# Patient Record
Sex: Female | Born: 1940 | Race: Black or African American | Hispanic: No | Smoking: Former smoker
Health system: Southern US, Community
[De-identification: ages and names within clinical notes are randomized; demographics above are authoritative.]

## PROBLEM LIST (undated history)

## (undated) DIAGNOSIS — C50919 Malignant neoplasm of unspecified site of unspecified female breast: Secondary | ICD-10-CM

## (undated) DIAGNOSIS — K219 Gastro-esophageal reflux disease without esophagitis: Secondary | ICD-10-CM

## (undated) DIAGNOSIS — I10 Essential (primary) hypertension: Secondary | ICD-10-CM

## (undated) DIAGNOSIS — T7840XA Allergy, unspecified, initial encounter: Secondary | ICD-10-CM

## (undated) DIAGNOSIS — R42 Dizziness and giddiness: Secondary | ICD-10-CM

## (undated) DIAGNOSIS — K635 Polyp of colon: Secondary | ICD-10-CM

## (undated) DIAGNOSIS — G8929 Other chronic pain: Secondary | ICD-10-CM

## (undated) DIAGNOSIS — M25569 Pain in unspecified knee: Secondary | ICD-10-CM

## (undated) DIAGNOSIS — E039 Hypothyroidism, unspecified: Secondary | ICD-10-CM

## (undated) DIAGNOSIS — K750 Abscess of liver: Secondary | ICD-10-CM

## (undated) HISTORY — DX: Dizziness and giddiness: R42

## (undated) HISTORY — DX: Gastro-esophageal reflux disease without esophagitis: K21.9

## (undated) HISTORY — PX: TONSILLECTOMY: SUR1361

## (undated) HISTORY — DX: Hypothyroidism, unspecified: E03.9

## (undated) HISTORY — DX: Abscess of liver: K75.0

## (undated) HISTORY — PX: HEEL SPUR SURGERY: SHX665

## (undated) HISTORY — DX: Polyp of colon: K63.5

---

## 1977-08-16 HISTORY — PX: MASTECTOMY: SHX3

## 2000-11-14 ENCOUNTER — Emergency Department (HOSPITAL_COMMUNITY): Admission: EM | Admit: 2000-11-14 | Discharge: 2000-11-14 | Payer: Self-pay

## 2000-12-22 ENCOUNTER — Ambulatory Visit (HOSPITAL_BASED_OUTPATIENT_CLINIC_OR_DEPARTMENT_OTHER): Admission: RE | Admit: 2000-12-22 | Discharge: 2000-12-22 | Payer: Self-pay | Admitting: *Deleted

## 2001-05-18 ENCOUNTER — Emergency Department (HOSPITAL_COMMUNITY): Admission: EM | Admit: 2001-05-18 | Discharge: 2001-05-18 | Payer: Self-pay | Admitting: Emergency Medicine

## 2002-02-05 ENCOUNTER — Emergency Department (HOSPITAL_COMMUNITY): Admission: EM | Admit: 2002-02-05 | Discharge: 2002-02-05 | Payer: Self-pay | Admitting: *Deleted

## 2003-07-19 ENCOUNTER — Emergency Department (HOSPITAL_COMMUNITY): Admission: EM | Admit: 2003-07-19 | Discharge: 2003-07-19 | Payer: Self-pay | Admitting: Emergency Medicine

## 2003-09-12 ENCOUNTER — Encounter: Admission: RE | Admit: 2003-09-12 | Discharge: 2003-09-12 | Payer: Self-pay | Admitting: Internal Medicine

## 2004-09-23 ENCOUNTER — Encounter: Admission: RE | Admit: 2004-09-23 | Discharge: 2004-09-23 | Payer: Self-pay | Admitting: Internal Medicine

## 2005-02-12 ENCOUNTER — Ambulatory Visit (HOSPITAL_COMMUNITY): Admission: RE | Admit: 2005-02-12 | Discharge: 2005-02-12 | Payer: Self-pay | Admitting: Obstetrics

## 2005-11-19 ENCOUNTER — Encounter: Admission: RE | Admit: 2005-11-19 | Discharge: 2005-11-19 | Payer: Self-pay | Admitting: Internal Medicine

## 2006-12-07 ENCOUNTER — Encounter: Admission: RE | Admit: 2006-12-07 | Discharge: 2006-12-07 | Payer: Self-pay | Admitting: Internal Medicine

## 2007-12-08 ENCOUNTER — Encounter: Admission: RE | Admit: 2007-12-08 | Discharge: 2007-12-08 | Payer: Self-pay | Admitting: Internal Medicine

## 2008-12-30 ENCOUNTER — Encounter: Admission: RE | Admit: 2008-12-30 | Discharge: 2008-12-30 | Payer: Self-pay | Admitting: Internal Medicine

## 2010-01-05 ENCOUNTER — Encounter: Admission: RE | Admit: 2010-01-05 | Discharge: 2010-01-05 | Payer: Self-pay | Admitting: Internal Medicine

## 2010-12-02 ENCOUNTER — Other Ambulatory Visit: Payer: Self-pay | Admitting: Internal Medicine

## 2010-12-02 DIAGNOSIS — Z9011 Acquired absence of right breast and nipple: Secondary | ICD-10-CM

## 2011-01-01 NOTE — Op Note (Signed)
[place]. [facility]  Patient:    Chelsey Savage, Chelsey Savage                     MRN: [id number] Proc. Date: 12/22/00 Adm. Date:  [id number] Attending:  Maryanna Shape                           Operative Report  PREOPERATIVE DIAGNOSIS:  Right heel Achilles tendon traction spur at os calcis.  POSTOPERATIVE DIAGNOSIS:  Right heel Achilles tendon traction spur at os calcis.  OPERATIVE PROCEDURE:  Excision of fractured traction spur.  ANESTHESIA:  General.  DESCRIPTION:  The patient was given a general anesthetic by endotracheal tube. This was accomplished with great difficulty.  The patient required several passes and use of a stent over which the endotracheal tube was passed.  All during this time she apparently was well-oxygenated and was stable.  When that was accomplished she was placed in the prone position with a pillow under her legs and longitudinal pillows to support her.  We then prepped the right lower extremity from the tips of the toes to the knee with DuraPrep then isolated this with appropriate drape.  Patient had broad chronic changes in her heel but she had very focal pain problem from what appeared to be Achilles tendon traction spur that probably had a fracture in it.  This was then approached through a vertical incision just to the lateral side of direct center.  The soft tissues were pushed aside until we saw the Achilles tendon.  The paratenon was divided and the Achilles tendon in this area was fairly degenerative over a, perhaps, 0.5 cm wide area. I could push and feel that this calcification was a bit mobile.  I then used a 15 blade knife and dissected free the degenerative tendon fibers from this loose osteophytic ridge.  Having done that it felt like there was probably a little more ridge but that was not mobile and it was not at all in the area of her tenderness.  Immediately deep to the excision of the osteophyte there were still  intact Achilles tendon fibers which appeared to be normal.  I resected some of the more degenerative tissue and then approximated the paratenon with 4-0 Vicryl, placed two subcutaneous 4-0 Vicryls, and approximated the skin edges with 4-0 nylon, then applied a very bulky dressing.  PLAN:  The plan will be for her to use crutches and just toe-touch, elevate her leg but move her ankle frequently, take Vicodin for pain, and then she would be seeing me in the office next week.  I think we will be able to ambulate her with a fracture boot. DD:  12/22/00 TD:  12/22/00 Job: [id number]

## 2011-01-08 ENCOUNTER — Ambulatory Visit
Admission: RE | Admit: 2011-01-08 | Discharge: 2011-01-08 | Disposition: A | Discharge status: Home / Self Care | Payer: Medicare Other | Source: Ambulatory Visit | Attending: Internal Medicine | Admitting: Internal Medicine

## 2011-01-08 DIAGNOSIS — Z9011 Acquired absence of right breast and nipple: Secondary | ICD-10-CM

## 2011-12-17 ENCOUNTER — Other Ambulatory Visit: Payer: Self-pay | Admitting: Internal Medicine

## 2011-12-17 DIAGNOSIS — Z1231 Encounter for screening mammogram for malignant neoplasm of breast: Secondary | ICD-10-CM

## 2011-12-17 DIAGNOSIS — Z9011 Acquired absence of right breast and nipple: Secondary | ICD-10-CM

## 2012-01-17 ENCOUNTER — Ambulatory Visit
Admission: RE | Admit: 2012-01-17 | Discharge: 2012-01-17 | Disposition: A | Discharge status: Home / Self Care | Payer: Medicare Other | Source: Ambulatory Visit | Attending: Internal Medicine | Admitting: Internal Medicine

## 2012-01-17 DIAGNOSIS — Z1231 Encounter for screening mammogram for malignant neoplasm of breast: Secondary | ICD-10-CM

## 2012-01-17 DIAGNOSIS — Z9011 Acquired absence of right breast and nipple: Secondary | ICD-10-CM

## 2012-03-26 ENCOUNTER — Encounter (HOSPITAL_COMMUNITY): Payer: Self-pay | Admitting: Emergency Medicine

## 2012-03-26 ENCOUNTER — Emergency Department (HOSPITAL_COMMUNITY): Payer: Medicare Other

## 2012-03-26 ENCOUNTER — Inpatient Hospital Stay (HOSPITAL_COMMUNITY)
Admission: EM | Admit: 2012-03-26 | Discharge: 2012-04-03 | DRG: 442 | Disposition: A | Discharge status: Home Health | Payer: Medicare Other | Attending: Internal Medicine | Admitting: Internal Medicine

## 2012-03-26 DIAGNOSIS — Z853 Personal history of malignant neoplasm of breast: Secondary | ICD-10-CM

## 2012-03-26 DIAGNOSIS — R630 Anorexia: Secondary | ICD-10-CM | POA: Diagnosis present

## 2012-03-26 DIAGNOSIS — N289 Disorder of kidney and ureter, unspecified: Secondary | ICD-10-CM | POA: Diagnosis present

## 2012-03-26 DIAGNOSIS — R5381 Other malaise: Secondary | ICD-10-CM | POA: Diagnosis present

## 2012-03-26 DIAGNOSIS — D509 Iron deficiency anemia, unspecified: Secondary | ICD-10-CM | POA: Diagnosis present

## 2012-03-26 DIAGNOSIS — E039 Hypothyroidism, unspecified: Secondary | ICD-10-CM

## 2012-03-26 DIAGNOSIS — K750 Abscess of liver: Principal | ICD-10-CM

## 2012-03-26 DIAGNOSIS — Z6839 Body mass index (BMI) 39.0-39.9, adult: Secondary | ICD-10-CM

## 2012-03-26 DIAGNOSIS — K8020 Calculus of gallbladder without cholecystitis without obstruction: Secondary | ICD-10-CM

## 2012-03-26 DIAGNOSIS — I10 Essential (primary) hypertension: Secondary | ICD-10-CM

## 2012-03-26 DIAGNOSIS — R509 Fever, unspecified: Secondary | ICD-10-CM

## 2012-03-26 DIAGNOSIS — D72829 Elevated white blood cell count, unspecified: Secondary | ICD-10-CM

## 2012-03-26 DIAGNOSIS — E860 Dehydration: Secondary | ICD-10-CM

## 2012-03-26 DIAGNOSIS — R7989 Other specified abnormal findings of blood chemistry: Secondary | ICD-10-CM

## 2012-03-26 DIAGNOSIS — E669 Obesity, unspecified: Secondary | ICD-10-CM | POA: Diagnosis present

## 2012-03-26 DIAGNOSIS — N179 Acute kidney failure, unspecified: Secondary | ICD-10-CM

## 2012-03-26 DIAGNOSIS — B9689 Other specified bacterial agents as the cause of diseases classified elsewhere: Secondary | ICD-10-CM | POA: Diagnosis present

## 2012-03-26 DIAGNOSIS — R5383 Other fatigue: Secondary | ICD-10-CM | POA: Diagnosis present

## 2012-03-26 DIAGNOSIS — R160 Hepatomegaly, not elsewhere classified: Secondary | ICD-10-CM

## 2012-03-26 DIAGNOSIS — N390 Urinary tract infection, site not specified: Secondary | ICD-10-CM

## 2012-03-26 DIAGNOSIS — E876 Hypokalemia: Secondary | ICD-10-CM

## 2012-03-26 HISTORY — DX: Allergy, unspecified, initial encounter: T78.40XA

## 2012-03-26 HISTORY — DX: Essential (primary) hypertension: I10

## 2012-03-26 HISTORY — DX: Malignant neoplasm of unspecified site of unspecified female breast: C50.919

## 2012-03-26 LAB — COMPREHENSIVE METABOLIC PANEL
AST: 61 U/L — ABNORMAL HIGH (ref 0–37)
Alkaline Phosphatase: 147 U/L — ABNORMAL HIGH (ref 39–117)
BUN: 51 mg/dL — ABNORMAL HIGH (ref 6–23)
CO2: 27 mEq/L (ref 19–32)
Calcium: 9.7 mg/dL (ref 8.4–10.5)
Chloride: 95 mEq/L — ABNORMAL LOW (ref 96–112)
Creatinine, Ser: 2.32 mg/dL — ABNORMAL HIGH (ref 0.50–1.10)
GFR calc Af Amer: 23 mL/min — ABNORMAL LOW (ref >90)
GFR calc non Af Amer: 20 mL/min — ABNORMAL LOW (ref >90)
Glucose, Bld: 126 mg/dL — ABNORMAL HIGH (ref 70–99)
Potassium: 3 mEq/L — ABNORMAL LOW (ref 3.5–5.1)
Sodium: 137 mEq/L (ref 135–145)
Total Bilirubin: 3.8 mg/dL — ABNORMAL HIGH (ref 0.3–1.2)

## 2012-03-26 LAB — URINALYSIS, ROUTINE W REFLEX MICROSCOPIC
Specific Gravity, Urine: 1.024 (ref 1.005–1.030)
Urobilinogen, UA: 4 mg/dL — ABNORMAL HIGH (ref 0.0–1.0)

## 2012-03-26 LAB — CBC WITH DIFFERENTIAL/PLATELET
Basophils Absolute: 0.1 10*3/uL (ref 0.0–0.1)
HCT: 41.3 % (ref 36.0–46.0)
Hemoglobin: 14.5 g/dL (ref 12.0–15.0)
Lymphocytes Relative: 10 % — ABNORMAL LOW (ref 12–46)
MCH: 27.6 pg (ref 26.0–34.0)
WBC: 13.4 10*3/uL — ABNORMAL HIGH (ref 4.0–10.5)

## 2012-03-26 LAB — URINE MICROSCOPIC-ADD ON

## 2012-03-26 MED ORDER — POTASSIUM CHLORIDE CRYS ER 20 MEQ PO TBCR
40.0000 meq | EXTENDED_RELEASE_TABLET | Freq: Once | ORAL | Status: DC
  Administered 2012-03-26: 40 meq via ORAL
  Filled 2012-03-26: qty 2

## 2012-03-26 MED ORDER — DEXTROSE 5 % IV SOLN
1.0000 g | Freq: Once | INTRAVENOUS | Status: AC
  Administered 2012-03-26: 1 g via INTRAVENOUS
  Filled 2012-03-26: qty 10

## 2012-03-26 MED ORDER — SODIUM CHLORIDE 0.9 % IV SOLN: INTRAVENOUS | Status: DC

## 2012-03-26 MED ORDER — SODIUM CHLORIDE 0.9 % IV BOLUS (SEPSIS)
700.0000 mL | Freq: Once | INTRAVENOUS | Status: AC
  Administered 2012-03-26: 700 mL via INTRAVENOUS

## 2012-03-26 NOTE — ED Notes (Signed)
Pt states that last Wednesday she began feeling fatigued, weak, and having no appetite.  Just general unwell feeling.

## 2012-03-26 NOTE — ED Notes (Signed)
Attempted to call reportto 3 west. Nurse unavailable at the time will return cal in a few minutes.

## 2012-03-26 NOTE — H&P (Signed)
PCP:  Dr Concepcion Elk   Chief Complaint:  Weakness, failure to thrive  HPI: This is a 71 year old female with a history of hypothyroidism, hypertension who has loss of appetite or 4 days has been getting worse. She has had limited by mouth intake of food and fluids. She does admit to having diaphoresis, but denies fevers, chills. She denies vomiting and and current diarrhea. No palliating provoking factors. She denies dysuria, and frequency, and hesitancy.  Review of Systems:  10 system review of systems was performed - as in history of present illness otherwise negative.  Past Medical History: Past Medical History  Diagnosis Date  . Hypothyroid   . Hypertension    Past Surgical History  Procedure Date  . Mastectomy     Medications: Prior to Admission medications   Medication Sig Start Date End Date Taking? Authorizing Provider  aspirin 325 MG tablet Take 325 mg by mouth daily.   Yes Historical Provider, MD  cetirizine (ZYRTEC) 10 MG tablet Take 10 mg by mouth daily.   Yes Historical Provider, MD  cholecalciferol (VITAMIN D) 1000 UNITS tablet Take 1,000 Units by mouth daily.   Yes Historical Provider, MD  levothyroxine (SYNTHROID, LEVOTHROID) 100 MCG tablet Take 100 mcg by mouth daily.   Yes Historical Provider, MD  valsartan-hydrochlorothiazide (DIOVAN-HCT) 160-25 MG per tablet Take 1 tablet by mouth daily.   Yes Historical Provider, MD    Allergies:  No Known Allergies  Social History:  reports that she has never smoked. She does not have any smokeless tobacco history on file. She reports that she does not drink alcohol or use illicit drugs.  Family History: History reviewed. No pertinent family history.  Physical Exam: Filed Vitals:   03/26/12 1533 03/26/12 1922  BP: 119/45 124/44  Pulse: 82 76  Temp: 100.7 F (38.2 C) 99.1 F (37.3 C)  TempSrc: Oral Oral  Resp: 18 14  SpO2: 94% 95%   General appearance: alert, cooperative and no distress Head: Normocephalic,  without obvious abnormality, atraumatic Eyes: conjunctivae/corneas clear. PERRL, EOM's intact. Fundi benign. Ears: normal TM's and external ear canals both ears Throat: Lips, mucosa normal. She has one tooth, which appears normal Neck: no adenopathy, no carotid bruit, no JVD, supple, symmetrical, trachea midline and thyroid not enlarged, symmetric, no tenderness/mass/nodules Resp: clear to auscultation bilaterally Cardio: regular rate and rhythm, S1, S2 normal, no murmur, click, rub or gallop GI: obese. Soft mild lower abdominal tenderness to Extremities: extremities normal, atraumatic, no cyanosis or edema Pulses: 2+ and symmetric Skin: Skin color, texture, turgor normal. No rashes or lesions Lymph nodes: Cervical, supraclavicular, and axillary nodes normal. Neurologic: Alert and oriented X 3, normal strength and tone. Normal symmetric reflexes. Normal coordination and gait   Labs on Admission:   [facility] 03/26/12 1817  NA 137  K 3.0*  CL 95*  CO2 27  GLUCOSE 126*  BUN 51*  CREATININE 2.32*  CALCIUM 9.7  MG --  PHOS --    Basename 03/26/12 1817  AST 61*  ALT 58*  ALKPHOS 147*  BILITOT 3.8*  PROT 7.6  ALBUMIN 2.6*   No results found for this basename: LIPASE:2,AMYLASE:2 in the last 72 hours  Basename 03/26/12 1817  WBC 13.4*  NEUTROABS 10.5*  HGB 14.5  HCT 41.3  MCV 78.5  PLT 237   No results found for this basename: CKTOTAL:3,CKMB:3,CKMBINDEX:3,TROPONINI:3 in the last 72 hours No results found for this basename: TSH,T4TOTAL,FREET3,T3FREE,THYROIDAB in the last 72 hours No results found for this basename: VITAMINB12:2,FOLATE:2,FERRITIN:2,TIBC:2,IRON:2,RETICCTPCT:2 in  the last 72 hours  Radiological Exams on Admission: Dg Chest 2 View  03/26/2012  *RADIOLOGY REPORT*  Clinical Data: Failure to thrive  CHEST - 2 VIEW  Comparison: None.  Findings: Previous right mastectomy with vascular clips near the anterolateral chest wall.  Mild central pulmonary vascular  congestion.  Mild cardiomegaly.  Tortuous atheromatous aorta.  No effusion.  Minimal lower thoracic spondylitic changes.  IMPRESSION:  Mild cardiomegaly and central pulmonary vascular congestion.  Original Report Authenticated By: Osa Craver, M.D.   US Abdomen Complete  03/26/2012  *RADIOLOGY REPORT*  Clinical Data:  Elevated liver function test.  Gallstones.  Nausea and fever.  COMPLETE ABDOMINAL ULTRASOUND  Comparison:  None.  Findings:  Gallbladder:  Cholelithiasis.  No sonographic Murphy's sign.  The largest gallstone measures 15 mm.  Gallbladder wall is borderline, ranging from 2 mm to 4 mm.  On the decubitus film, gallbladder is packed with gallstones with multiple echogenic foci.  Common bile duct:  Normal at 5 mm.  Portions of the common bile duct are obscured.  Liver:  There is a complex heterogeneous mass at the right posterior hepatic lobe measuring 10.3 cm x 10.2 cm x 9.2 cm.  This has internal vascular flow.  Although nonspecific, this is suspicious for neoplasm or abscess.  CT abdomen and pelvis with contrast recommended for further assessment.  IVC:  Appears normal.  Pancreas:  Partially obscured by overlying bowel gas.  Spleen:  5.8 cm. Normal echotexture.  Normal central sinus echo complex.  No calculi or hydronephrosis.  Right Kidney:  11.9 cm.  Renal cortical thinning compatible with medical renal disease.  Left Kidney:  11.9 cm.  Renal cortical thinning compatible with medical renal disease.  Abdominal aorta:  Suboptimal visualization.  26 mm in the upper abdomen.  IMPRESSION: 1.  Cholelithiasis without convincing evidence of acute cholecystitis.  No sonographic Murphy's sign.  Borderline wall thickening. 2.  10.3 cm mass in the right hepatic lobe.  Follow-up CT with infusion recommended for further assessment, depending on renal function.  Alternatively, nonemergent MRI may be useful for further characterization. 3.  Renal cortical thinning compatible with medical renal disease. No  stones or hydronephrosis.  Original Report Authenticated By: Andreas Newport, M.D.    Assessment/Plan Present on Admission:  .UTI (lower urinary tract infection) .Hypokalemia .Acute renal insufficiency .Dehydration  #1 urinary tract infection: Admit to medical floor. Urine cultures obtained in emergency department. Continue Rocephin.  #2 hypokalemia: Replace potassium by mouth and IVF.  #3 dehydration: Fluid replacement.  #4 acute renal insufficiency: Secondary to #3. No known chronic kidney disease. We'll fluid replaced and monitor creatinine. Will hold ARB - this can be restarted with resolution of the insufficiency.    #5 hypertension: Continue hydrochlorothiazide.  #6 hypothyroidism: Continue levothyroxin  #7 DVT prophylaxis: Lovenox  #8 Code status: Full code  Greater than 60 minutes was spent admission this patient.  Linden Tagliaferro JEHIEL 03/26/2012, 11:02 PM

## 2012-03-26 NOTE — ED Notes (Signed)
After pt walked to bathroom O2 sat. Dropped to 85% pt was placed on 2-Lmp O2 and sat levels are >90%

## 2012-03-26 NOTE — ED Notes (Signed)
MD at bedside. 

## 2012-03-26 NOTE — ED Provider Notes (Signed)
History     CSN: [id number]  Arrival date & time 03/26/12  1445   First MD Initiated Contact with Patient 03/26/12 1937      Chief Complaint  Patient presents with  . Failure To Thrive  . Weakness  . Fatigue    (Consider location/radiation/quality/duration/timing/severity/associated sxs/prior treatment) HPI  Patient reports she started having loss of appetite 4 days ago. She also reports she's been having lots of diaphoresis but she denies any chest pain or abdominal pain. She states she feels like her mouth is dry. She denies nausea or vomiting. She states she did have diarrhea about 6 days ago but that is gone. She's had a mild dry cough. She states she's had fever at home. She denies feeling dizzy but she does feel weak. 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 Patient states she's never felt this way before.  PCP Dr. Concepcion Elk  Past Medical History  Diagnosis Date  . Hypothyroid   . Hypertension     Past Surgical History  Procedure Date  . Mastectomy     History reviewed. No pertinent family history.  History  Substance Use Topics  . Smoking status: Never Smoker   . Smokeless tobacco: Not on file  . Alcohol Use: No   lives alone  OB History    Grav Para Term Preterm Abortions TAB SAB Ect Mult Living                  Review of Systems  All other systems reviewed and are negative.    Allergies  Review of patient's allergies indicates no known allergies.  Home Medications   Current Outpatient Rx  Name Route Sig Dispense Refill  . ASPIRIN 325 MG PO TABS Oral Take 325 mg by mouth daily.    Marland Kitchen CETIRIZINE HCL 10 MG PO TABS Oral Take 10 mg by mouth daily.    Marland Kitchen VITAMIN D 1000 UNITS PO TABS Oral Take 1,000 Units by mouth daily.    Marland Kitchen LEVOTHYROXINE SODIUM 100 MCG PO TABS Oral Take 100 mcg by mouth daily.    Marland Kitchen VALSARTAN-HYDROCHLOROTHIAZIDE 160-25 MG PO TABS Oral Take 1 tablet by mouth  daily.      ED Triage Vitals  Enc Vitals Group     BP 03/26/12 1533 119/45 mmHg     Pulse Rate 03/26/12 1533 82      Resp 03/26/12 1533 18      Temp 03/26/12 1533 100.7 F (38.2 C)     Temp src 03/26/12 1533 Oral     SpO2 03/26/12 1533 94 %     Weight --      Height --      Head Cir --      Peak Flow --      Pain Score --      Pain Loc --      Pain Edu? --      Excl. in GC? --     Vital signs normal except low grade fever   Physical Exam  Nursing note and vitals reviewed. Constitutional: She is oriented to person, place, and time. She appears well-developed and well-nourished.  Non-toxic appearance. She does not appear ill. No distress.  HENT:  Head: Normocephalic and atraumatic.  Right Ear: External ear normal.  Left Ear: External ear normal.  Nose: Nose normal. No mucosal edema or rhinorrhea.  Mouth/Throat: Mucous membranes are normal. No dental abscesses or  uvula swelling.       Dry tongue  Eyes: Conjunctivae and EOM are normal. Pupils are equal, round, and reactive to light.  Neck: Normal range of motion and full passive range of motion without pain. Neck supple.  Cardiovascular: Normal rate, regular rhythm and normal heart sounds.  Exam reveals no gallop and no friction rub.   No murmur heard. Pulmonary/Chest: Effort normal and breath sounds normal. No respiratory distress. She has no wheezes. She has no rhonchi. She has no rales. She exhibits no tenderness and no crepitus.  Abdominal: Soft. Normal appearance and bowel sounds are normal. She exhibits no distension. There is no tenderness. There is no rebound and no guarding.  Musculoskeletal: Normal range of motion. She exhibits no edema and no tenderness.       Moves all extremities well.   Neurological: She is alert and oriented to person, place, and time. She has normal strength. No cranial nerve deficit.  Skin: Skin is warm, dry and intact. No rash noted. No erythema. No pallor.  Psychiatric: She has a normal  mood and affect. Her speech is normal and behavior is normal. Her mood appears not anxious.    ED Course  Procedures (including critical care time)   Medications  0.9 %  sodium chloride infusion (not administered)  cefTRIAXone (ROCEPHIN) 1 g in dextrose 5 % 50 mL IVPB (not administered)  potassium chloride SA (K-DUR,KLOR-CON) CR tablet 40 mEq (not administered)  sodium chloride 0.9 % bolus 700 mL (700 mL Intravenous Given 03/26/12 2030)   Pt given IV antibiotics. We discussed f/u with her doctor, has an appointment on the 15th, however we have no old baseline labs and she has significant renal insufficiency and is unaware of having any renal issues. Would warrant overnight IV hydration and recheck labs in am.   22:32 Dr Adrian Blackwater, admit to obs, med-surg team 8  Results for orders placed during the hospital encounter of 03/26/12  CBC WITH DIFFERENTIAL      Component Value Range   WBC 13.4 (*) 4.0 - 10.5 K/uL   RBC 5.26 (*) 3.87 - 5.11 MIL/uL   Hemoglobin 14.5  12.0 - 15.0 g/dL   HCT 19.1  47.8 - 29.5 %   MCV 78.5  78.0 - 100.0 fL   MCH 27.6  26.0 - 34.0 pg   MCHC 35.1  30.0 - 36.0 g/dL   RDW 62.1  30.8 - 65.7 %   Platelets 237  150 - 400 K/uL   Neutrophils Relative 78 (*) 43 - 77 %   Lymphocytes Relative 10 (*) 12 - 46 %   Monocytes Relative 11  3 - 12 %   Eosinophils Relative 0  0 - 5 %   Basophils Relative 1  0 - 1 %   Neutro Abs 10.5 (*) 1.7 - 7.7 K/uL   Lymphs Abs 1.3  0.7 - 4.0 K/uL   Monocytes Absolute 1.5 (*) 0.1 - 1.0 K/uL   Eosinophils Absolute 0.0  0.0 - 0.7 K/uL   Basophils Absolute 0.1  0.0 - 0.1 K/uL   RBC Morphology TARGET CELLS     Smear Review PLATELET CLUMPS NOTED ON SMEAR    COMPREHENSIVE METABOLIC PANEL      Component Value Range   Sodium 137  135 - 145 mEq/L   Potassium 3.0 (*) 3.5 - 5.1 mEq/L   Chloride 95 (*) 96 - 112 mEq/L   CO2 27  19 - 32 mEq/L   Glucose, Bld 126 (*)  70 - 99 mg/dL   BUN 51 (*) 6 - 23 mg/dL   Creatinine, Ser 8.11 (*) 0.50 - 1.10  mg/dL   Calcium 9.7  8.4 - 91.4 mg/dL   Total Protein 7.6  6.0 - 8.3 g/dL   Albumin 2.6 (*) 3.5 - 5.2 g/dL   AST 61 (*) 0 - 37 U/L   ALT 58 (*) 0 - 35 U/L   Alkaline Phosphatase 147 (*) 39 - 117 U/L   Total Bilirubin 3.8 (*) 0.3 - 1.2 mg/dL   GFR calc non Af Amer 20 (*) >90 mL/min   GFR calc Af Amer 23 (*) >90 mL/min  URINALYSIS, ROUTINE W REFLEX MICROSCOPIC      Component Value Range   Color, Urine ORANGE (*) YELLOW   APPearance TURBID (*) CLEAR   Specific Gravity, Urine 1.024  1.005 - 1.030   pH 5.0  5.0 - 8.0   Glucose, UA NEGATIVE  NEGATIVE mg/dL   Hgb urine dipstick NEGATIVE  NEGATIVE   Bilirubin Urine LARGE (*) NEGATIVE   Ketones, ur TRACE (*) NEGATIVE mg/dL   Protein, ur 30 (*) NEGATIVE mg/dL   Urobilinogen, UA 4.0 (*) 0.0 - 1.0 mg/dL   Nitrite POSITIVE (*) NEGATIVE   Leukocytes, UA LARGE (*) NEGATIVE  URINE MICROSCOPIC-ADD ON      Component Value Range   Squamous Epithelial / LPF FEW (*) RARE   WBC, UA TOO NUMEROUS TO COUNT  <3 WBC/hpf   RBC / HPF 0-2  <3 RBC/hpf   Bacteria, UA MANY (*) RARE   Urine-Other AMORPHOUS URATES/PHOSPHATES     Laboratory interpretation all normal except hypokalemia, renal insufficiency, elevation of LFTs, leukocytosis     Dg Chest 2 View  03/26/2012  *RADIOLOGY REPORT*  Clinical Data: Failure to thrive  CHEST - 2 VIEW  Comparison: None.  Findings: Previous right mastectomy with vascular clips near the anterolateral chest wall.  Mild central pulmonary vascular congestion.  Mild cardiomegaly.  Tortuous atheromatous aorta.  No effusion.  Minimal lower thoracic spondylitic changes.  IMPRESSION:  Mild cardiomegaly and central pulmonary vascular congestion.  Original Report Authenticated By: Osa Craver, M.D.   US Abdomen Complete  03/26/2012  *RADIOLOGY REPORT*  Clinical Data:  Elevated liver function test.  Gallstones.  Nausea and fever.  COMPLETE ABDOMINAL ULTRASOUND  Comparison:  None.  Findings:  Gallbladder:  Cholelithiasis.  No  sonographic Murphy's sign.  The largest gallstone measures 15 mm.  Gallbladder wall is borderline, ranging from 2 mm to 4 mm.  On the decubitus film, gallbladder is packed with gallstones with multiple echogenic foci.  Common bile duct:  Normal at 5 mm.  Portions of the common bile duct are obscured.  Liver:  There is a complex heterogeneous mass at the right posterior hepatic lobe measuring 10.3 cm x 10.2 cm x 9.2 cm.  This has internal vascular flow.  Although nonspecific, this is suspicious for neoplasm or abscess.  CT abdomen and pelvis with contrast recommended for further assessment.  IVC:  Appears normal.  Pancreas:  Partially obscured by overlying bowel gas.  Spleen:  5.8 cm. Normal echotexture.  Normal central sinus echo complex.  No calculi or hydronephrosis.  Right Kidney:  11.9 cm.  Renal cortical thinning compatible with medical renal disease.  Left Kidney:  11.9 cm.  Renal cortical thinning compatible with medical renal disease.  Abdominal aorta:  Suboptimal visualization.  26 mm in the upper abdomen.  IMPRESSION: 1.  Cholelithiasis without convincing evidence of acute  cholecystitis.  No sonographic Murphy's sign.  Borderline wall thickening. 2.  10.3 cm mass in the right hepatic lobe.  Follow-up CT with infusion recommended for further assessment, depending on renal function.  Alternatively, nonemergent MRI may be useful for further characterization. 3.  Renal cortical thinning compatible with medical renal disease. No stones or hydronephrosis.  Original Report Authenticated By: Andreas Newport, M.D.     1. Fever   2. Urinary tract infection   3. Gallstones   4. Hypokalemia   5. Renal insufficiency   6. Elevated liver function tests   7. Liver mass     Plan admission to observation  Devoria Albe, MD, FACEP   MDM          Ward Givens, MD 03/26/12 2239

## 2012-03-27 ENCOUNTER — Encounter (HOSPITAL_COMMUNITY): Payer: Self-pay

## 2012-03-27 DIAGNOSIS — E860 Dehydration: Secondary | ICD-10-CM

## 2012-03-27 DIAGNOSIS — N189 Chronic kidney disease, unspecified: Secondary | ICD-10-CM | POA: Diagnosis present

## 2012-03-27 DIAGNOSIS — D72829 Elevated white blood cell count, unspecified: Secondary | ICD-10-CM | POA: Diagnosis present

## 2012-03-27 DIAGNOSIS — N179 Acute kidney failure, unspecified: Secondary | ICD-10-CM | POA: Diagnosis present

## 2012-03-27 DIAGNOSIS — K8020 Calculus of gallbladder without cholecystitis without obstruction: Secondary | ICD-10-CM

## 2012-03-27 LAB — BASIC METABOLIC PANEL
BUN: 46 mg/dL — ABNORMAL HIGH (ref 6–23)
GFR calc Af Amer: 31 mL/min — ABNORMAL LOW (ref >90)
GFR calc non Af Amer: 26 mL/min — ABNORMAL LOW (ref >90)
Potassium: 3.2 mEq/L — ABNORMAL LOW (ref 3.5–5.1)
Sodium: 139 mEq/L (ref 135–145)

## 2012-03-27 LAB — CBC
Hemoglobin: 10.7 g/dL — ABNORMAL LOW (ref 12.0–15.0)
MCHC: 36.4 g/dL — ABNORMAL HIGH (ref 30.0–36.0)
RBC: 3.81 MIL/uL — ABNORMAL LOW (ref 3.87–5.11)
WBC: 18.4 10*3/uL — ABNORMAL HIGH (ref 4.0–10.5)

## 2012-03-27 MED ORDER — DOCUSATE SODIUM 100 MG PO CAPS
100.0000 mg | ORAL_CAPSULE | Freq: Every day | ORAL | Status: DC | PRN
  Filled 2012-03-27: qty 1

## 2012-03-27 MED ORDER — ALUM & MAG HYDROXIDE-SIMETH 200-200-20 MG/5ML PO SUSP
30.0000 mL | Freq: Four times a day (QID) | ORAL | Status: DC | PRN
  Administered 2012-03-27 – 2012-03-29 (×2): 30 mL via ORAL
  Filled 2012-03-27 (×2): qty 30

## 2012-03-27 MED ORDER — ENOXAPARIN SODIUM 40 MG/0.4ML ~~LOC~~ SOLN
40.0000 mg | SUBCUTANEOUS | Status: DC
  Filled 2012-03-27: qty 0.4

## 2012-03-27 MED ORDER — ACETAMINOPHEN 325 MG PO TABS
650.0000 mg | ORAL_TABLET | Freq: Four times a day (QID) | ORAL | Status: DC | PRN
  Administered 2012-03-28: 650 mg via ORAL
  Filled 2012-03-27: qty 2

## 2012-03-27 MED ORDER — LORATADINE 10 MG PO TABS
10.0000 mg | ORAL_TABLET | Freq: Every day | ORAL | Status: DC
  Administered 2012-03-27 – 2012-04-03 (×8): 10 mg via ORAL
  Filled 2012-03-27 (×8): qty 1

## 2012-03-27 MED ORDER — LEVOTHYROXINE SODIUM 100 MCG PO TABS
100.0000 ug | ORAL_TABLET | Freq: Every day | ORAL | Status: DC
  Administered 2012-03-27 – 2012-04-03 (×8): 100 ug via ORAL
  Filled 2012-03-27 (×8): qty 1

## 2012-03-27 MED ORDER — POTASSIUM CHLORIDE CRYS ER 20 MEQ PO TBCR
40.0000 meq | EXTENDED_RELEASE_TABLET | Freq: Two times a day (BID) | ORAL | Status: AC
  Administered 2012-03-27 (×2): 40 meq via ORAL
  Filled 2012-03-27 (×2): qty 2

## 2012-03-27 MED ORDER — HYDROCHLOROTHIAZIDE 25 MG PO TABS
25.0000 mg | ORAL_TABLET | Freq: Every day | ORAL | Status: DC
  Administered 2012-03-27 – 2012-03-28 (×2): 25 mg via ORAL
  Filled 2012-03-27 (×2): qty 1

## 2012-03-27 MED ORDER — SODIUM CHLORIDE 0.9 % IV SOLN
INTRAVENOUS | Status: DC
  Administered 2012-03-27 – 2012-03-28 (×2): via INTRAVENOUS

## 2012-03-27 MED ORDER — DEXTROSE 5 % IV SOLN
1.0000 g | INTRAVENOUS | Status: DC
  Administered 2012-03-27 – 2012-03-29 (×3): 1 g via INTRAVENOUS
  Filled 2012-03-27 (×3): qty 10

## 2012-03-27 MED ORDER — ENSURE COMPLETE PO LIQD
237.0000 mL | Freq: Every day | ORAL | Status: DC
  Administered 2012-03-27 – 2012-03-30 (×3): 237 mL via ORAL

## 2012-03-27 MED ORDER — POTASSIUM CHLORIDE CRYS ER 20 MEQ PO TBCR: 40.0000 meq | EXTENDED_RELEASE_TABLET | Freq: Two times a day (BID) | ORAL | Status: DC

## 2012-03-27 MED ORDER — ACETAMINOPHEN 650 MG RE SUPP: 650.0000 mg | Freq: Four times a day (QID) | RECTAL | Status: DC | PRN

## 2012-03-27 MED ORDER — POTASSIUM CHLORIDE IN NACL 40-0.9 MEQ/L-% IV SOLN
INTRAVENOUS | Status: DC
  Administered 2012-03-27: 16:00:00 via INTRAVENOUS
  Administered 2012-03-27: 100 mL/h via INTRAVENOUS
  Filled 2012-03-27 (×3): qty 1000

## 2012-03-27 NOTE — Progress Notes (Signed)
INITIAL ADULT NUTRITION ASSESSMENT Date: 03/27/2012   Time: 2:08 PM Reason for Assessment: Nutrition Risk for dysphagia  ASSESSMENT: Female 71 y.o.  Dx: UTI (lower urinary tract infection)  Hx:  Past Medical History  Diagnosis Date  . Hypothyroid   . Hypertension   . Breast cancer   . Allergy     Related Meds:  Scheduled Meds:   . cefTRIAXone (ROCEPHIN)  IV  1 g Intravenous Once  . cefTRIAXone (ROCEPHIN)  IV  1 g Intravenous Q24H  . enoxaparin (LOVENOX) injection  40 mg Subcutaneous Q24H  . hydrochlorothiazide  25 mg Oral Daily  . levothyroxine  100 mcg Oral Daily  . loratadine  10 mg Oral Daily  . potassium chloride  40 mEq Oral BID  . sodium chloride  700 mL Intravenous Once  . DISCONTD: potassium chloride  40 mEq Oral Once  . DISCONTD: potassium chloride  40 mEq Oral BID   Continuous Infusions:   . sodium chloride    . sodium chloride    . 0.9 % NaCl with KCl 40 mEq / L 100 mL/hr (03/27/12 0252)   PRN Meds:.acetaminophen, acetaminophen, alum & mag hydroxide-simeth, docusate sodium   Ht: 5\' 6"  (167.6 cm)  Wt: 241 lb 12.8 oz (109.68 kg)  Ideal Wt: 59.09 kg % Ideal Wt: 185% Wt Readings from Last 10 Encounters:  03/27/12 241 lb 12.8 oz (109.68 kg)    Body mass index is 39.03 kg/(m^2). (Obesity class 2)  Food/Nutrition Related Hx: Patient reported her appetite has been poor for the past week. She reported she has continued to eat her meals at home. She denies any problems with chewing or swallowing, but that her mouth has been dry. She reported she sometimes drinks Ensure at home.   Labs:  CMP     Component Value Date/Time   NA 139 03/27/2012 0530   K 3.2* 03/27/2012 0530   CL 97 03/27/2012 0530   CO2 26 03/27/2012 0530   GLUCOSE 125* 03/27/2012 0530   BUN 46* 03/27/2012 0530   CREATININE 1.84* 03/27/2012 0530   CALCIUM 9.0 03/27/2012 0530   PROT 7.6 03/26/2012 1817   ALBUMIN 2.6* 03/26/2012 1817   AST 61* 03/26/2012 1817   ALT 58* 03/26/2012 1817   ALKPHOS  147* 03/26/2012 1817   BILITOT 3.8* 03/26/2012 1817   GFRNONAA 26* 03/27/2012 0530   GFRAA 31* 03/27/2012 0530    Intake/Output Summary (Last 24 hours) at 03/27/12 1410 Last data filed at 03/27/12 1359  Gross per 24 hour  Intake   2004 ml  Output    800 ml  Net   1204 ml     Diet Order: General  Supplements/Tube Feeding: none at this time  IVF:    sodium chloride   sodium chloride   0.9 % NaCl with KCl 40 mEq / L Last Rate: 100 mL/hr (03/27/12 0252)    Estimated Nutritional Needs:   Kcal: 1600-1700 Protein: 109.5-131 grams Fluid: 1 ml per kcal intake  NUTRITION DIAGNOSIS: -Inadequate oral intake (NI-2.1).  Status: Ongoing  RELATED TO: poor appetite  AS EVIDENCE BY: pt report of poor intake over the past week likely meeting < 75% of estimated energy needs.    MONITORING/EVALUATION(Goals): PO intake, weights, labs 1. PO intake > 75% of estimated energy needs.  EDUCATION NEEDS: -No education needs identified at this time  INTERVENTION: 1. Will order pt chocolate ensure once daily. Provides 250 kcal and 9 grams of protein daily.  2. RD to follow for  nutrition plan of care.   Dietitian [phone]  DOCUMENTATION CODES Per approved criteria  -Obesity Unspecified    Iven Finn [facility] 03/27/2012, 2:08 PM

## 2012-03-27 NOTE — Progress Notes (Signed)
Patient ID: Chelsey Savage, female   DOB: [birth year], 71 y.o.   MRN: [id number] [facility] PROGRESS NOTE  LARYAH NEUSER WUJ:[id number] DOB: 04-Apr-1941 DOA: 03/26/2012 PCP: No primary provider on file.  Brief narrative: 71 year old female admitted for diarrhea, weakness, poor PO intake.  Assessment/Plan:  Principal Problem:  *UTI (lower urinary tract infection) - continue rocephin - follow up urine culture results  Active Problems: Leukocytosis - secondary to UTI versus C.diff or viral gastroenteritis - check C. Diff PCR - follow up CBC in am  Acute kidney injury - secondary to dehydration - improving with IV fluids - follow up BMP in am   Hypertension - continue home meds   Hypothyroid - continue synthroid   Hypokalemia - secondary to GI losses - repleted - follow up BMP in am  Code Status: full code Family Communication: son at bedside Disposition Plan: home when stable  Manson Passey, MD  [facility] Pager [phone]  If 7PM-7AM, please contact night-coverage [email or web address] Password [email or web address] 03/27/2012, 6:32 PM   LOS: 1 day   Consultants:  Physical therapy  Procedures:  none  Antibiotics:  Rocephin 03/26/2012 -->  HPI/Subjective: No acute events overnight.  Objective: Filed Vitals:   03/26/12 2356 03/27/12 0059 03/27/12 0547 03/27/12 1450  BP: 119/51 128/72 114/63 120/79  Pulse: 64 70 81 78  Temp: 98.3 F (36.8 C) 98.9 F (37.2 C) 100.5 F (38.1 C) 98.8 F (37.1 C)  TempSrc: Oral Oral Oral   Resp: 20 20 20 18   Height:  5\' 6"  (1.676 m)    Weight:  109.68 kg (241 lb 12.8 oz)    SpO2: 100% 93% 91% 94%    Intake/Output Summary (Last 24 hours) at 03/27/12 1832 Last data filed at 03/27/12 1500  Gross per 24 hour  Intake   2244 ml  Output   1000 ml  Net   1244 ml    Exam:   General:  Pt is alert, follows commands appropriately, not in acute distress  Cardiovascular: Regular rate and rhythm, S1/S2, no murmurs,  no rubs, no gallops  Respiratory: Clear to auscultation bilaterally, no wheezing, no crackles, no rhonchi  Abdomen: Soft,tender to light palpation across mid abdomen, non distended, bowel sounds present, no guarding  Extremities: No edema, pulses DP and PT palpable bilaterally  Neuro: Grossly nonfocal  Data Reviewed: Basic Metabolic Panel:  Lab 03/27/12 8657 03/26/12 1817  NA 139 137  K 3.2* 3.0*  CL 97 95*  CO2 26 27  GLUCOSE 125* 126*  BUN 46* 51*  CREATININE 1.84* 2.32*  CALCIUM 9.0 9.7  MG -- --  PHOS -- --   Liver Function Tests:  Lab 03/26/12 1817  AST 61*  ALT 58*  ALKPHOS 147*  BILITOT 3.8*  PROT 7.6  ALBUMIN 2.6*   No results found for this basename: LIPASE:5,AMYLASE:5 in the last 168 hours No results found for this basename: AMMONIA:5 in the last 168 hours CBC:  Lab 03/27/12 0530 03/26/12 1817  WBC 18.4* 13.4*  NEUTROABS -- 10.5*  HGB 10.7* 14.5  HCT 29.4* 41.3  MCV 77.2* 78.5  PLT 282 237   Cardiac Enzymes: No results found for this basename: CKTOTAL:5,CKMB:5,CKMBINDEX:5,TROPONINI:5 in the last 168 hours BNP: No components found with this basename: POCBNP:5 CBG: No results found for this basename: GLUCAP:5 in the last 168 hours  No results found for this or any previous visit (from the past 240 hour(s)).   Studies: Dg Chest 2 View  03/26/2012  *RADIOLOGY  REPORT*  Clinical Data: Failure to thrive  CHEST - 2 VIEW  Comparison: None.  Findings: Previous right mastectomy with vascular clips near the anterolateral chest wall.  Mild central pulmonary vascular congestion.  Mild cardiomegaly.  Tortuous atheromatous aorta.  No effusion.  Minimal lower thoracic spondylitic changes.  IMPRESSION:  Mild cardiomegaly and central pulmonary vascular congestion.  Original Report Authenticated By: Osa Craver, M.D.   US Abdomen Complete  03/26/2012  *RADIOLOGY REPORT*  Clinical Data:  Elevated liver function test.  Gallstones.  Nausea and fever.   COMPLETE ABDOMINAL ULTRASOUND  Comparison:  None.  Findings:  Gallbladder:  Cholelithiasis.  No sonographic Murphy's sign.  The largest gallstone measures 15 mm.  Gallbladder wall is borderline, ranging from 2 mm to 4 mm.  On the decubitus film, gallbladder is packed with gallstones with multiple echogenic foci.  Common bile duct:  Normal at 5 mm.  Portions of the common bile duct are obscured.  Liver:  There is a complex heterogeneous mass at the right posterior hepatic lobe measuring 10.3 cm x 10.2 cm x 9.2 cm.  This has internal vascular flow.  Although nonspecific, this is suspicious for neoplasm or abscess.  CT abdomen and pelvis with contrast recommended for further assessment.  IVC:  Appears normal.  Pancreas:  Partially obscured by overlying bowel gas.  Spleen:  5.8 cm. Normal echotexture.  Normal central sinus echo complex.  No calculi or hydronephrosis.  Right Kidney:  11.9 cm.  Renal cortical thinning compatible with medical renal disease.  Left Kidney:  11.9 cm.  Renal cortical thinning compatible with medical renal disease.  Abdominal aorta:  Suboptimal visualization.  26 mm in the upper abdomen.  IMPRESSION: 1.  Cholelithiasis without convincing evidence of acute cholecystitis.  No sonographic Murphy's sign.  Borderline wall thickening. 2.  10.3 cm mass in the right hepatic lobe.  Follow-up CT with infusion recommended for further assessment, depending on renal function.  Alternatively, nonemergent MRI may be useful for further characterization. 3.  Renal cortical thinning compatible with medical renal disease. No stones or hydronephrosis.  Original Report Authenticated By: Andreas Newport, M.D.    Scheduled Meds:   . cefTRIAXone (ROCEPHIN)  IV  1 g Intravenous Once  . cefTRIAXone (ROCEPHIN)  IV  1 g Intravenous Q24H  . enoxaparin (LOVENOX) injection  40 mg Subcutaneous Q24H  . feeding supplement  237 mL Oral QPC supper  . hydrochlorothiazide  25 mg Oral Daily  . levothyroxine  100 mcg Oral  Daily  . loratadine  10 mg Oral Daily  . potassium chloride  40 mEq Oral BID  . sodium chloride  700 mL Intravenous Once  . DISCONTD: potassium chloride  40 mEq Oral Once  . DISCONTD: potassium chloride  40 mEq Oral BID   Continuous Infusions:   . sodium chloride    . DISCONTD: sodium chloride    . DISCONTD: 0.9 % NaCl with KCl 40 mEq / L 100 mL/hr at 03/27/12 1538

## 2012-03-28 DIAGNOSIS — N179 Acute kidney failure, unspecified: Secondary | ICD-10-CM

## 2012-03-28 DIAGNOSIS — R160 Hepatomegaly, not elsewhere classified: Secondary | ICD-10-CM | POA: Insufficient documentation

## 2012-03-28 DIAGNOSIS — K769 Liver disease, unspecified: Secondary | ICD-10-CM

## 2012-03-28 DIAGNOSIS — R7989 Other specified abnormal findings of blood chemistry: Secondary | ICD-10-CM

## 2012-03-28 DIAGNOSIS — D72829 Elevated white blood cell count, unspecified: Secondary | ICD-10-CM

## 2012-03-28 DIAGNOSIS — R509 Fever, unspecified: Secondary | ICD-10-CM

## 2012-03-28 LAB — BASIC METABOLIC PANEL
CO2: 23 mEq/L (ref 19–32)
Chloride: 105 mEq/L (ref 96–112)
Creatinine, Ser: 1.49 mg/dL — ABNORMAL HIGH (ref 0.50–1.10)
Glucose, Bld: 107 mg/dL — ABNORMAL HIGH (ref 70–99)

## 2012-03-28 LAB — CBC
Hemoglobin: 9.4 g/dL — ABNORMAL LOW (ref 12.0–15.0)
MCV: 79.1 fL (ref 78.0–100.0)
Platelets: 330 10*3/uL (ref 150–400)
RBC: 3.39 MIL/uL — ABNORMAL LOW (ref 3.87–5.11)
WBC: 19.7 10*3/uL — ABNORMAL HIGH (ref 4.0–10.5)

## 2012-03-28 LAB — URINE CULTURE

## 2012-03-28 MED ORDER — HYDRALAZINE HCL 25 MG PO TABS
25.0000 mg | ORAL_TABLET | Freq: Three times a day (TID) | ORAL | Status: DC
  Administered 2012-03-28 – 2012-03-29 (×2): 25 mg via ORAL
  Filled 2012-03-28 (×6): qty 1

## 2012-03-28 MED ORDER — CIPROFLOXACIN HCL 500 MG PO TABS: 500.0000 mg | ORAL_TABLET | Freq: Two times a day (BID) | ORAL | Status: DC

## 2012-03-28 NOTE — Consult Note (Signed)
Patient seen, examined, and I agree with the above documentation, including the assessment and plan. I recommend MRI with contrast to better evaluate her liver lesion. While abscess is possible, we need to exclude other possibilities including malignancy. At this point there is no other indication for chronic liver disease. She remains on antibiotics for UTI. Likely dilutional anemia without overt bleeding

## 2012-03-28 NOTE — Progress Notes (Signed)
[facility] PROGRESS NOTE  HAILA DENA ZOX:[id number] DOB: [birth year] DOA: 03/26/2012 PCP: No primary provider on file.  Brief narrative: 71 -year-old pleasant female with past medical history of hypertension, hypothyroidism who was admitted 03/26/2012 with working diagnosis of urinary tract infection secondary to possible dehydration. Patient was started on ceftriaxone however her white blood cell count continues to rise in addition to low grade fevers. Evaluation in emergency department included abdominal ultrasound with findings of 10.3 cm hepatic mass suspicious for malignant lesion. Gastroenterology consulted for an input on management.  Assessment/Plan:   Principal Problem:  *UTI (lower urinary tract infection)   Will continue ceftriaxone IV daily for now  Follow up urine culture results  Active Problems:  Hepatic mass suspicious for a malignant lesion  MRI imaging although appropriate did not be done at this time due to acute kidney injury. Hope is that creatinine will trend down and that we'll be able to obtain the necessary imaging study  Appreciate gastroenterology consult  Leukocytosis   Secondary to urinary tract infection  Continue Rocephin for now  Acute kidney injury   Likely prerenal secondary to dehydration  Creatinine is trending down  Continue current  fluids  Hypertension   Will hold HCTZ and we will start hydralazine by mouth 25 mg Q 8 hours  Hypothyroid   continue synthroid   Hypokalemia   secondary to GI losses   repleted   Potassium within normal limits today  DVT prophylaxis  SCDs bilaterally  Code Status: Full code Family Communication: None at bedside Disposition Plan: Home when stable  Manson Passey, MD  [facility] Pager [phone]  If 7PM-7AM, please contact night-coverage [email or web address] Password [email or web address] 03/28/2012, 1:12 PM   LOS: 2 days   Consultants:  Gastroenterology    Procedures:  None  Antibiotics:  Rocephin 03/27/2012 -->  HPI/Subjective: No significant events overnight. Patient reports no longer having diarrhea.  Objective: Filed Vitals:   03/27/12 0547 03/27/12 1450 03/27/12 2126 03/28/12 0537  BP: 114/63 120/79 109/67 125/71  Pulse: 81 78 88 65  Temp: 100.5 F (38.1 C) 98.8 F (37.1 C) 98.9 F (37.2 C) 98 F (36.7 C)  TempSrc: Oral  Oral Oral  Resp: 20 18 18 18   Height:      Weight:      SpO2: 91% 94% 92% 98%    Intake/Output Summary (Last 24 hours) at 03/28/12 1312 Last data filed at 03/27/12 1500  Gross per 24 hour  Intake   1124 ml  Output    200 ml  Net    924 ml    Exam:   General:  Pt is alert, follows commands appropriately, not in acute distress  Cardiovascular: Regular rate and rhythm, S1/S2, no murmurs, no rubs, no gallops  Respiratory: Clear to auscultation bilaterally, no wheezing, no crackles, no rhonchi  Abdomen: Soft, non tender, non distended, bowel sounds present, no guarding  Extremities: No edema, pulses DP and PT palpable bilaterally  Neuro: Grossly nonfocal  Data Reviewed: Basic Metabolic Panel:  Lab 03/28/12 8295 03/27/12 0530 03/26/12 1817  NA 139 139 137  K 4.5 3.2* 3.0*  CL 105 97 95*  CO2 23 26 27   GLUCOSE 107* 125* 126*  BUN 35* 46* 51*  CREATININE 1.49* 1.84* 2.32*  CALCIUM 8.3* 9.0 9.7   Liver Function Tests:  Lab 03/26/12 1817  AST 61*  ALT 58*  ALKPHOS 147*  BILITOT 3.8*  PROT 7.6  ALBUMIN 2.6*   CBC:  Lab 03/28/12 0345 03/27/12  0530 03/26/12 1817  WBC 19.7* 18.4* 13.4*  HGB 9.4* 10.7* 14.5  HCT 26.8* 29.4* 41.3  MCV 79.1 77.2* 78.5  PLT 330 282 237     Studies: Dg Chest 2 View 03/26/2012  * IMPRESSION:  Mild cardiomegaly and central pulmonary vascular congestion.    US Abdomen Complete 03/26/2012  *  IMPRESSION: 1.  Cholelithiasis without convincing evidence of acute cholecystitis.  No sonographic Murphy's sign.  Borderline wall thickening. 2.  10.3 cm  mass in the right hepatic lobe.  Follow-up CT with infusion recommended for further assessment, depending on renal function.  Alternatively, nonemergent MRI may be useful for further characterization. 3.  Renal cortical thinning compatible with medical renal disease. No stones or hydronephrosis.     Scheduled Meds:  . cefTRIAXone   1 g Intravenous Q24H  . feeding supplement  237 mL Oral QPC supper  . hydrochlorothiazide  25 mg Oral Daily  . levothyroxine  100 mcg Oral Daily  . loratadine  10 mg Oral Daily   Continuous Infusions:  . sodium chloride 100 mL/hr at 03/28/12 [phone]

## 2012-03-28 NOTE — Consult Note (Signed)
[facility] Consultation  Referring Provider: [facility] Primary Care Physician:  No primary provider on file. Primary Gastroenterologist:   Sharrell Ku, MD Reason for Consultation:  liver lesion   HPI: Chelsey Savage is a 71 y.o. female admitted on 8/11 for UTI and dehydration. Patient had WBC of 13.4 on admission, despite antibiotics it has risen to 19.7.  Patient had elevated elevated LFTs on admission, ultrasound shows right liver mass with internal vascular flow. Patient denies abdominal pain. She did have non-bloody diarrhea on Sunday and Monday, none since. She had low grade temps at home. No pruritis, no weight loss  No known history of liver disease. No history of ETOH. She is a former smoker. [facility] negative for liver problems or colon cancer.   Past Medical History  Diagnosis Date  . Hypothyroid   . Hypertension   . Breast cancer   . Allergy     Past Surgical History  Procedure Date  . Mastectomy     Prior to Admission medications   Medication Sig Start Date End Date Taking? Authorizing Provider  aspirin 325 MG tablet Take 325 mg by mouth daily.   Yes Historical Provider, MD  cetirizine (ZYRTEC) 10 MG tablet Take 10 mg by mouth daily.   Yes Historical Provider, MD  cholecalciferol (VITAMIN D) 1000 UNITS tablet Take 1,000 Units by mouth daily.   Yes Historical Provider, MD  levothyroxine (SYNTHROID, LEVOTHROID) 100 MCG tablet Take 100 mcg by mouth daily.   Yes Historical Provider, MD  valsartan-hydrochlorothiazide (DIOVAN-HCT) 160-25 MG per tablet Take 1 tablet by mouth daily.   Yes Historical Provider, MD  ciprofloxacin (CIPRO) 500 MG tablet Take 1 tablet (500 mg total) by mouth 2 (two) times daily. 03/28/12 04/07/12  Alison Murray, MD    Current Facility-Administered Medications  Medication Dose Route Frequency Provider Last Rate Last Dose  . 0.9 %  sodium chloride infusion   Intravenous Continuous Ward Givens, MD 100 mL/hr at 03/28/12 0658    .  acetaminophen (TYLENOL) tablet 650 mg  650 mg Oral Q6H PRN Levie Heritage, DO       Or  . acetaminophen (TYLENOL) suppository 650 mg  650 mg Rectal Q6H PRN Rhona Raider Stinson, DO      . alum & mag hydroxide-simeth (MAALOX/MYLANTA) 200-200-20 MG/5ML suspension 30 mL  30 mL Oral Q6H PRN Rhona Raider Stinson, DO   30 mL at 03/27/12 0533  . cefTRIAXone (ROCEPHIN) 1 g in dextrose 5 % 50 mL IVPB  1 g Intravenous Q24H Rhona Raider Stinson, DO   1 g at 03/27/12 2325  . docusate sodium (COLACE) capsule 100 mg  100 mg Oral Daily PRN Rhona Raider Stinson, DO      . feeding supplement (ENSURE COMPLETE) liquid 237 mL  237 mL Oral QPC supper Anastasio Champion, RD   237 mL at 03/27/12 1800  . hydrochlorothiazide (HYDRODIURIL) tablet 25 mg  25 mg Oral Daily Rhona Raider Stinson, DO   25 mg at 03/27/12 0935  . levothyroxine (SYNTHROID, LEVOTHROID) tablet 100 mcg  100 mcg Oral Daily Rhona Raider Stinson, DO   100 mcg at 03/27/12 0935  . loratadine (CLARITIN) tablet 10 mg  10 mg Oral Daily Rhona Raider Stinson, DO   10 mg at 03/27/12 0935  . DISCONTD: 0.9 %  sodium chloride infusion   Intravenous Continuous Ward Givens, MD      . DISCONTD: 0.9 % NaCl with KCl 40 mEq / L  infusion  Intravenous Continuous Levie Heritage, DO 100 mL/hr at 03/27/12 1538    . DISCONTD: enoxaparin (LOVENOX) injection 40 mg  40 mg Subcutaneous Q24H Rhona Raider Stinson, DO        Allergies as of 03/26/2012  . (No Known Allergies)      History   Social History  . Marital Status: Married    Spouse Name: N/A    Number of Children: N/A  . Years of Education: N/A   Occupational History  . Not on file.   Social History Main Topics  . Smoking status: Never Smoker   . Smokeless tobacco: Not on file  . Alcohol Use: No  . Drug Use: No  . Sexually Active:    Review of Systems: Negative except where noted in HPI  PHYSICAL EXAM: Vital signs in last 24 hours: Temp:  [98 F (36.7 C)-98.9 F (37.2 C)] 98 F (36.7 C) (08/13 0537) Pulse Rate:  [65-88] 65  (08/13  0537) Resp:  [18] 18  (08/13 0537) BP: (109-125)/(67-79) 125/71 mmHg (08/13 0537) SpO2:  [92 %-98 %] 98 % (08/13 0537) Last BM Date: 03/27/12 General:   Pleasant black female in NAD Head:  Normocephalic and atraumatic. Eyes:   No icterus.   Conjunctiva pink. Ears:  Normal auditory acuity. Neck:  Supple; no masses felt Lungs:  Respirations even and unlabored. Lungs clear to auscultation bilaterally.   No wheezes, crackles, or rhonchi.  Heart:  Regular rate and rhythm; slight murmur heard. Abdomen:  Soft, obese, nontender. Normal bowel sounds. No appreciable masses.  Rectal:  Not performed.  Msk:  Symmetrical without gross deformities.  Extremities:  Without edema. Neurologic:  Alert and  oriented x4;  grossly normal neurologically. Skin:  Intact without significant lesions or rashes. Cervical Nodes:  No significant cervical adenopathy. Psych:  Alert and cooperative. Normal affect.  LAB RESULTS:  Basename 03/28/12 0345 03/27/12 0530 03/26/12 1817  WBC 19.7* 18.4* 13.4*  HGB 9.4* 10.7* 14.5  HCT 26.8* 29.4* 41.3  PLT 330 282 237   BMET  Basename 03/28/12 0345 03/27/12 0530 03/26/12 1817  NA 139 139 137  K 4.5 3.2* 3.0*  CL 105 97 95*  CO2 23 26 27   GLUCOSE 107* 125* 126*  BUN 35* 46* 51*  CREATININE 1.49* 1.84* 2.32*  CALCIUM 8.3* 9.0 9.7   LFT  Basename 03/26/12 1817  PROT 7.6  ALBUMIN 2.6*  AST 61*  ALT 58*  ALKPHOS 147*  BILITOT 3.8*  BILIDIR --  IBILI --   PT/INR No results found for this basename: LABPROT:2,INR:2 in the last 72 hours  STUDIES: Dg Chest 2 View  03/26/2012  *RADIOLOGY REPORT*  Clinical Data: Failure to thrive  CHEST - 2 VIEW  Comparison: None.  Findings: Previous right mastectomy with vascular clips near the anterolateral chest wall.  Mild central pulmonary vascular congestion.  Mild cardiomegaly.  Tortuous atheromatous aorta.  No effusion.  Minimal lower thoracic spondylitic changes.  IMPRESSION:  Mild cardiomegaly and central pulmonary  vascular congestion.  Original Report Authenticated By: Osa Craver, M.D.   US Abdomen Complete  03/26/2012  *RADIOLOGY REPORT*  Clinical Data:  Elevated liver function test.  Gallstones.  Nausea and fever.  COMPLETE ABDOMINAL ULTRASOUND  Comparison:  None.  Findings:  Gallbladder:  Cholelithiasis.  No sonographic Murphy's sign.  The largest gallstone measures 15 mm.  Gallbladder wall is borderline, ranging from 2 mm to 4 mm.  On the decubitus film, gallbladder is packed with gallstones with multiple echogenic  foci.  Common bile duct:  Normal at 5 mm.  Portions of the common bile duct are obscured.  Liver:  There is a complex heterogeneous mass at the right posterior hepatic lobe measuring 10.3 cm x 10.2 cm x 9.2 cm.  This has internal vascular flow.  Although nonspecific, this is suspicious for neoplasm or abscess.  CT abdomen and pelvis with contrast recommended for further assessment.  IVC:  Appears normal.  Pancreas:  Partially obscured by overlying bowel gas.  Spleen:  5.8 cm. Normal echotexture.  Normal central sinus echo complex.  No calculi or hydronephrosis.  Right Kidney:  11.9 cm.  Renal cortical thinning compatible with medical renal disease.  Left Kidney:  11.9 cm.  Renal cortical thinning compatible with medical renal disease.  Abdominal aorta:  Suboptimal visualization.  26 mm in the upper abdomen.  IMPRESSION: 1.  Cholelithiasis without convincing evidence of acute cholecystitis.  No sonographic Murphy's sign.  Borderline wall thickening. 2.  10.3 cm mass in the right hepatic lobe.  Follow-up CT with infusion recommended for further assessment, depending on renal function.  Alternatively, nonemergent MRI may be useful for further characterization. 3.  Renal cortical thinning compatible with medical renal disease. No stones or hydronephrosis.  Original Report Authenticated By: Andreas Newport, M.D.     PREVIOUS ENDOSCOPIES: Colonoscopy x3 - Dr. Kinnie Scales - history of polyps  IMPRESSION  / PLAN: 1. Abnormal ultrasound. Right posterior hepatic lobe mass measuring 10.3 cm x 10.2 cm x 9.2 cm with internal vascular flow. Liver abscess list in differential but not sure of her risk factors for this. Obviously malignant liver lesion needs to e excluded. Patient admitted with renal failure but that is resolving. Ideally MRI with contrast would be done to better characterize the lesion.  2.  History of colon polyps, no details. Patient followed by Dr. Kinnie Scales. Sounds like she is up to date on surveillance exams.  3. UTI, on antibiotics  4. Leukocytosis, workup in progress. Leukocytosis trending up despite antibiotics for UTI.    5. Microcytic anemia. Acute drop in hemoglobin from 14.5 to 10.7 to 9.4. No overt GIB. Patient admitted with dehydration, her baseline may not really be at 14. Furthermore, she has been getting IVF so some of the hemoglobin drop probably dilutional. Will check in am.   Thanks   LOS: 2 days   [name]  03/28/2012, 10:26 AM

## 2012-03-28 NOTE — Progress Notes (Signed)
Patient had one bowel movement during the day shift and described it as soft, not loose.  Question need for PT assessment and/or OT assessment prior to discharge?

## 2012-03-29 ENCOUNTER — Inpatient Hospital Stay (HOSPITAL_COMMUNITY): Payer: Medicare Other

## 2012-03-29 LAB — CBC
Hemoglobin: 9.6 g/dL — ABNORMAL LOW (ref 12.0–15.0)
MCH: 27.4 pg (ref 26.0–34.0)
MCHC: 34.9 g/dL (ref 30.0–36.0)
MCV: 78.6 fL (ref 78.0–100.0)
RBC: 3.5 MIL/uL — ABNORMAL LOW (ref 3.87–5.11)

## 2012-03-29 LAB — COMPREHENSIVE METABOLIC PANEL
ALT: 56 U/L — ABNORMAL HIGH (ref 0–35)
AST: 71 U/L — ABNORMAL HIGH (ref 0–37)
Albumin: 2.1 g/dL — ABNORMAL LOW (ref 3.5–5.2)
Calcium: 8.4 mg/dL (ref 8.4–10.5)
Chloride: 104 mEq/L (ref 96–112)
Creatinine, Ser: 1.37 mg/dL — ABNORMAL HIGH (ref 0.50–1.10)
Sodium: 141 mEq/L (ref 135–145)
Total Bilirubin: 4.1 mg/dL — ABNORMAL HIGH (ref 0.3–1.2)

## 2012-03-29 MED ORDER — HEPARIN SODIUM (PORCINE) 5000 UNIT/ML IJ SOLN
5000.0000 [IU] | Freq: Three times a day (TID) | INTRAMUSCULAR | Status: DC
  Administered 2012-03-29 – 2012-04-03 (×12): 5000 [IU] via SUBCUTANEOUS
  Filled 2012-03-29 (×18): qty 1

## 2012-03-29 MED ORDER — GADOBENATE DIMEGLUMINE 529 MG/ML IV SOLN
10.0000 mL | Freq: Once | INTRAVENOUS | Status: AC | PRN
  Administered 2012-03-29: 10 mL via INTRAVENOUS

## 2012-03-29 MED ORDER — SODIUM CHLORIDE 0.9 % IV SOLN
INTRAVENOUS | Status: AC
  Administered 2012-03-29: 17:00:00 via INTRAVENOUS

## 2012-03-29 MED ORDER — POTASSIUM CHLORIDE CRYS ER 20 MEQ PO TBCR
40.0000 meq | EXTENDED_RELEASE_TABLET | Freq: Once | ORAL | Status: AC
  Administered 2012-03-29: 40 meq via ORAL
  Filled 2012-03-29: qty 2

## 2012-03-29 NOTE — Progress Notes (Signed)
[facility] Progress Note  SUBJECTIVE: feels okay.   OBJECTIVE:  Vital signs in last 24 hours: Temp:  [98.9 F (37.2 C)-100.1 F (37.8 C)] 98.9 F (37.2 C) (08/14 0538) Pulse Rate:  [68-72] 68  (08/14 0538) Resp:  [16-18] 16  (08/14 0538) BP: (99-128)/(48-72) 99/64 mmHg (08/14 0538) SpO2:  [93 %-97 %] 97 % (08/14 0538) Last BM Date: 03/28/12 General:    Pleasant black female in NAD Heart:  Regular rate and rhythm Lungs: Respirations even and unlabored, lungs CTA bilaterally Abdomen:  Soft, nontender and nondistended. Normal bowel sounds. Neurologic:  Alert and oriented,  grossly normal neurologically. Psych:  Cooperative. Normal mood and affect.   Lab Results:  Basename 03/29/12 0325 03/28/12 0345 03/27/12 0530  WBC 17.4* 19.7* 18.4*  HGB 9.6* 9.4* 10.7*  HCT 27.5* 26.8* 29.4*  PLT 408* 330 282   BMET  Basename 03/29/12 0325 03/28/12 0345 03/27/12 0530  NA 141 139 139  K 3.3* 4.5 3.2*  CL 104 105 97  CO2 25 23 26   GLUCOSE 132* 107* 125*  BUN 27* 35* 46*  CREATININE 1.37* 1.49* 1.84*  CALCIUM 8.4 8.3* 9.0   LFT  Basename 03/29/12 0325  PROT 6.8  ALBUMIN 2.1*  AST 71*  ALT 56*  ALKPHOS 165*  BILITOT 4.1*  BILIDIR --  IBILI --     ASSESSMENT / PLAN:  1. Abnormal ultrasound. Right posterior hepatic lobe mass measuring 10.3 cm x 10.2 cm x 9.2 cm with internal vascular flow. Liver abscess list in differential but not sure of her risk factors for this. Obviously malignant liver lesion needs to excluded. Needs MRI with contrast to better characterize the lesion. I spoke to radiology, at this point her renal function will allow 1/2 to full dose of Gadolinium. Will order the MRI.   Patient admitted with renal failure but that is resolving. Ideally   2. History of colon polyps, no details. Patient followed by Dr. Kinnie Scales. Sounds like she is up to date on surveillance exams.   3. UTI, on antibiotics   4. Leukocytosis, workup in progress. WBC down  today.   5. Microcytic anemia. Acute drop in hemoglobin from 14.5 to 10.7 to 9.4 yesterday. Stable, hemoglobin 9.6 today.. No overt GIB. Patient admitted with dehydration, her baseline may not really be at 14. Furthermore, she has been getting IVF so some of the hemoglobin drop probably dilutional.    LOS: 3 days   [name]  03/29/2012, 9:40 AM

## 2012-03-29 NOTE — Progress Notes (Signed)
[facility]                                                                                Patient Demographics  Chelsey Savage, is a 71 y.o. female  ZOX:[id number]  [id number]  DOB - 1941/04/21  Admit date - 03/26/2012  Admitting Physician Levie Heritage, DO  Outpatient Primary MD for the patient is No primary provider on file.  LOS - 3   Chief Complaint  Patient presents with  . Failure To Thrive  . Weakness  . Fatigue        Assessment & Plan    Brief narrative:  44 -year-old pleasant female with past medical history of hypertension, hypothyroidism who was admitted 03/26/2012 with working diagnosis of urinary tract infection secondary to possible dehydration. Patient was started on ceftriaxone however her white blood cell count continues to rise in addition to low grade fevers. Evaluation in emergency department included abdominal ultrasound with findings of 10.3 cm hepatic mass suspicious for malignant lesion. Gastroenterology consulted for an input on management.      UTI (lower urinary tract infection) with leukocytosis and low-grade fever Will continue ceftriaxone IV daily for now  Urine sample was bad and contaminated we'll repeat urine culture with straight cath specimen. Since continues to have leukocytosis with low-grade fevers we'll get a renal ultrasound also     Hepatic mass suspicious for a malignant lesion  Discussed with GI bedside, plan is to obtain MRI of the abdomen which has been ordered by GI as renal function is much improved. This was discussed by the radiology team.     Acute kidney injury  Likely prerenal dehydration almost resolved with IV fluids.    Hypertension  Will hold HCTZ , or pressure on the soft side we'll discontinue hydralazine    Hypothyroid  continue synthroid , check TSH free T3 and free T4 level since she feels weak all over.   Hypokalemia  Replace and recheck in the morning    DVT  prophylaxis  SCDs bilaterally will add heparin    Code Status: Full  Family Communication: Discussed with son bedside on 03/29/2012  Disposition Plan: Home    Procedures abdominal MRI ordered by GI on 03/29/2012    Consults GI   Antibiotics  Rocephin   Time Spent in minutes   30   DVT Prophylaxis  Lovenox     Leroy Sea M.D on 03/29/2012 at 12:49 PM  Between 7am to 7pm - Pager - [phone]  After 7pm go to [email or web address] - password [email or web address]  And look for the night coverage person covering for me after hours  [facility] Office  [phone]    Subjective:   Chelsey Savage today has, No headache, No chest pain, No abdominal pain - No Nausea, No new weakness tingling or numbness, No Cough - SOB.   Objective:   Filed Vitals:   03/28/12 1316 03/28/12 2054 03/28/12 2132 03/29/12 0538  BP: 128/72 118/48 121/66 99/64  Pulse:  72  68  Temp: 99.6 F (37.6 C) 100.1 F (37.8 C)  98.9 F (37.2 C)  TempSrc: Oral Oral  Oral  Resp: 16 18  16  Height:      Weight:      SpO2: 93% 96%  97%    Wt Readings from Last 3 Encounters:  03/27/12 109.68 kg (241 lb 12.8 oz)    No intake or output data in the 24 hours ending 03/29/12 1249  Exam Awake Alert, Oriented X 3, No new F.N deficits, Normal affect [place].AT,PERRAL Supple Neck,No JVD, No cervical lymphadenopathy appriciated.  Symmetrical Chest wall movement, Good air movement bilaterally, CTAB RRR,No Gallops,Rubs or new Murmurs, No Parasternal Heave +ve B.Sounds, Abd Soft, Non tender, No organomegaly appriciated, No rebound - guarding or rigidity. No Cyanosis, Clubbing or edema, No new Rash or bruise      Data Review   CBC  Lab 03/29/12 0325 03/28/12 0345 03/27/12 0530 03/26/12 1817  WBC 17.4* 19.7* 18.4* 13.4*  HGB 9.6* 9.4* 10.7* 14.5  HCT 27.5* 26.8* 29.4* 41.3  PLT 408* 330 282 237  MCV 78.6 79.1 77.2* 78.5  MCH 27.4 27.7 28.1 27.6  MCHC 34.9 35.1 36.4* 35.1  RDW 13.3 13.3 13.2 13.2    LYMPHSABS -- -- -- 1.3  MONOABS -- -- -- 1.5*  EOSABS -- -- -- 0.0  BASOSABS -- -- -- 0.1  BANDABS -- -- -- --    Chemistries   Lab 03/29/12 0325 03/28/12 0345 03/27/12 0530 03/26/12 1817  NA 141 139 139 137  K 3.3* 4.5 3.2* 3.0*  CL 104 105 97 95*  CO2 25 23 26 27   GLUCOSE 132* 107* 125* 126*  BUN 27* 35* 46* 51*  CREATININE 1.37* 1.49* 1.84* 2.32*  CALCIUM 8.4 8.3* 9.0 9.7  MG -- -- -- --  AST 71* -- -- 61*  ALT 56* -- -- 58*  ALKPHOS 165* -- -- 147*  BILITOT 4.1* -- -- 3.8*   ------------------------------------------------------------------------------------------------------------------ estimated creatinine clearance is 47.3 ml/min (by C-G formula based on Cr of 1.37). ------------------------------------------------------------------------------------------------------------------ No results found for this basename: HGBA1C:2 in the last 72 hours ------------------------------------------------------------------------------------------------------------------ No results found for this basename: CHOL:2,HDL:2,LDLCALC:2,TRIG:2,CHOLHDL:2,LDLDIRECT:2 in the last 72 hours ------------------------------------------------------------------------------------------------------------------ No results found for this basename: TSH,T4TOTAL,FREET3,T3FREE,THYROIDAB in the last 72 hours ------------------------------------------------------------------------------------------------------------------ No results found for this basename: VITAMINB12:2,FOLATE:2,FERRITIN:2,TIBC:2,IRON:2,RETICCTPCT:2 in the last 72 hours  Coagulation profile No results found for this basename: INR:5,PROTIME:5 in the last 168 hours  No results found for this basename: DDIMER:2 in the last 72 hours  Cardiac Enzymes No results found for this basename: CK:3,CKMB:3,TROPONINI:3,MYOGLOBIN:3 in the last 168  hours ------------------------------------------------------------------------------------------------------------------ No components found with this basename: POCBNP:3  Micro Results Recent Results (from the past 240 hour(s))  URINE CULTURE     Status: Normal   Collection Time   03/26/12  7:41 PM      Component Value Range Status Comment   Specimen Description URINE, CLEAN CATCH   Final    Special Requests NONE   Final    Culture  Setup Time 03/27/2012 10:28   Final    Colony Count 90,000 COLONIES/ML   Final    Culture     Final    Value: Multiple bacterial morphotypes present, none predominant. Suggest appropriate recollection if clinically indicated.   Report Status 03/28/2012 FINAL   Final     Radiology Reports Dg Chest 2 View  03/26/2012  *RADIOLOGY REPORT*  Clinical Data: Failure to thrive  CHEST - 2 VIEW  Comparison: None.  Findings: Previous right mastectomy with vascular clips near the anterolateral chest wall.  Mild central pulmonary vascular congestion.  Mild cardiomegaly.  Tortuous atheromatous aorta.  No effusion.  Minimal lower thoracic spondylitic changes.  IMPRESSION:  Mild cardiomegaly and central pulmonary vascular congestion.  Original Report Authenticated By: Osa Craver, M.D.   US Abdomen Complete  03/26/2012  *RADIOLOGY REPORT*  Clinical Data:  Elevated liver function test.  Gallstones.  Nausea and fever.  COMPLETE ABDOMINAL ULTRASOUND  Comparison:  None.  Findings:  Gallbladder:  Cholelithiasis.  No sonographic Murphy's sign.  The largest gallstone measures 15 mm.  Gallbladder wall is borderline, ranging from 2 mm to 4 mm.  On the decubitus film, gallbladder is packed with gallstones with multiple echogenic foci.  Common bile duct:  Normal at 5 mm.  Portions of the common bile duct are obscured.  Liver:  There is a complex heterogeneous mass at the right posterior hepatic lobe measuring 10.3 cm x 10.2 cm x 9.2 cm.  This has internal vascular flow.  Although  nonspecific, this is suspicious for neoplasm or abscess.  CT abdomen and pelvis with contrast recommended for further assessment.  IVC:  Appears normal.  Pancreas:  Partially obscured by overlying bowel gas.  Spleen:  5.8 cm. Normal echotexture.  Normal central sinus echo complex.  No calculi or hydronephrosis.  Right Kidney:  11.9 cm.  Renal cortical thinning compatible with medical renal disease.  Left Kidney:  11.9 cm.  Renal cortical thinning compatible with medical renal disease.  Abdominal aorta:  Suboptimal visualization.  26 mm in the upper abdomen.  IMPRESSION: 1.  Cholelithiasis without convincing evidence of acute cholecystitis.  No sonographic Murphy's sign.  Borderline wall thickening. 2.  10.3 cm mass in the right hepatic lobe.  Follow-up CT with infusion recommended for further assessment, depending on renal function.  Alternatively, nonemergent MRI may be useful for further characterization. 3.  Renal cortical thinning compatible with medical renal disease. No stones or hydronephrosis.  Original Report Authenticated By: Andreas Newport, M.D.    Scheduled Meds:   . cefTRIAXone (ROCEPHIN)  IV  1 g Intravenous Q24H  . feeding supplement  237 mL Oral QPC supper  . hydrALAZINE  25 mg Oral Q8H  . levothyroxine  100 mcg Oral Daily  . loratadine  10 mg Oral Daily  . potassium chloride  40 mEq Oral Once  . DISCONTD: hydrochlorothiazide  25 mg Oral Daily   Continuous Infusions:   . DISCONTD: sodium chloride Stopped (03/28/12 0730)   PRN Meds:.acetaminophen, acetaminophen, alum & mag hydroxide-simeth, docusate sodium

## 2012-03-29 NOTE — Progress Notes (Signed)
I agree with the above documentation, including the assessment and plan. Await MRI results

## 2012-03-30 ENCOUNTER — Inpatient Hospital Stay (HOSPITAL_COMMUNITY): Payer: Medicare Other

## 2012-03-30 LAB — BASIC METABOLIC PANEL
GFR calc Af Amer: 50 mL/min — ABNORMAL LOW (ref >90)
GFR calc non Af Amer: 43 mL/min — ABNORMAL LOW (ref >90)
Potassium: 4 mEq/L (ref 3.5–5.1)
Sodium: 138 mEq/L (ref 135–145)

## 2012-03-30 LAB — BODY FLUID CELL COUNT WITH DIFFERENTIAL
Eos, Fluid: 0 %
Monocyte-Macrophage-Serous Fluid: 11 % — ABNORMAL LOW (ref 50–90)
Total Nucleated Cell Count, Fluid: 320100 cu mm — ABNORMAL HIGH (ref 0–1000)

## 2012-03-30 LAB — CBC
Hemoglobin: 10.2 g/dL — ABNORMAL LOW (ref 12.0–15.0)
RBC: 3.69 MIL/uL — ABNORMAL LOW (ref 3.87–5.11)
WBC: 15.5 10*3/uL — ABNORMAL HIGH (ref 4.0–10.5)

## 2012-03-30 LAB — T3, FREE: T3, Free: 1.2 pg/mL — ABNORMAL LOW (ref 2.3–4.2)

## 2012-03-30 LAB — T4, FREE: Free T4: 1.15 ng/dL (ref 0.80–1.80)

## 2012-03-30 LAB — PROTIME-INR
INR: 1.19 (ref 0.00–1.49)
Prothrombin Time: 15.4 seconds — ABNORMAL HIGH (ref 11.6–15.2)

## 2012-03-30 LAB — TSH: TSH: 6.261 u[IU]/mL — ABNORMAL HIGH (ref 0.350–4.500)

## 2012-03-30 MED ORDER — MIDAZOLAM HCL 5 MG/5ML IJ SOLN
INTRAMUSCULAR | Status: AC | PRN
  Administered 2012-03-30 (×2): 1 mg via INTRAVENOUS

## 2012-03-30 MED ORDER — PIPERACILLIN-TAZOBACTAM 3.375 G IVPB
3.3750 g | Freq: Three times a day (TID) | INTRAVENOUS | Status: DC
  Administered 2012-03-30 – 2012-04-02 (×8): 3.375 g via INTRAVENOUS
  Filled 2012-03-30 (×10): qty 50

## 2012-03-30 MED ORDER — SODIUM CHLORIDE 0.9 % IV SOLN
INTRAVENOUS | Status: DC
  Administered 2012-03-30: 17:00:00 via INTRAVENOUS

## 2012-03-30 MED ORDER — FENTANYL CITRATE 0.05 MG/ML IJ SOLN
INTRAMUSCULAR | Status: AC | PRN
  Administered 2012-03-30 (×2): 50 ug via INTRAVENOUS

## 2012-03-30 NOTE — Progress Notes (Signed)
ANTIBIOTIC CONSULT NOTE - INITIAL  Pharmacy Consult for Zosyn Indication: Liver abscess  No Known Allergies  Patient Measurements: Height: 5\' 6"  (167.6 cm) Weight: 241 lb 12.8 oz (109.68 kg) IBW/kg (Calculated) : 59.3   Vital Signs: Temp: 98.1 F (36.7 C) (08/15 0605) Temp src: Oral (08/15 0605) BP: 117/61 mmHg (08/15 0605) Pulse Rate: 72  (08/15 0605) Intake/Output from previous day: 08/14 0701 - 08/15 0700 In: 1050 [P.O.:600; I.V.:450] Out: -  Intake/Output from this shift:    Labs:  Basename 03/30/12 0333 03/29/12 0325 03/28/12 0345  WBC 15.5* 17.4* 19.7*  HGB 10.2* 9.6* 9.4*  PLT 467* 408* 330  LABCREA -- -- --  CREATININE 1.22* 1.37* 1.49*   Estimated Creatinine Clearance: 53.1 ml/min (by C-G formula based on Cr of 1.22). No results found for this basename: VANCOTROUGH:2,VANCOPEAK:2,VANCORANDOM:2,GENTTROUGH:2,GENTPEAK:2,GENTRANDOM:2,TOBRATROUGH:2,TOBRAPEAK:2,TOBRARND:2,AMIKACINPEAK:2,AMIKACINTROU:2,AMIKACIN:2, in the last 72 hours   Microbiology: Recent Results (from the past 720 hour(s))  URINE CULTURE     Status: Normal   Collection Time   03/26/12  7:41 PM      Component Value Range Status Comment   Specimen Description URINE, CLEAN CATCH   Final    Special Requests NONE   Final    Culture  Setup Time 03/27/2012 10:28   Final    Colony Count 90,000 COLONIES/ML   Final    Culture     Final    Value: Multiple bacterial morphotypes present, none predominant. Suggest appropriate recollection if clinically indicated.   Report Status 03/28/2012 FINAL   Final     Medical History: Past Medical History  Diagnosis Date  . Hypothyroid   . Hypertension   . Breast cancer   . Allergy     Medications:  Anti-infectives     Start     Dose/Rate Route Frequency Ordered Stop   03/30/12 1015  piperacillin-tazobactam (ZOSYN) IVPB 3.375 g       3.375 g 12.5 mL/hr over 240 Minutes Intravenous Every 8 hours 03/30/12 1006     03/28/12 0000   ciprofloxacin (CIPRO) 500  MG tablet        500 mg Oral 2 times daily 03/28/12 0851 04/07/12 2359   03/27/12 2300   cefTRIAXone (ROCEPHIN) 1 g in dextrose 5 % 50 mL IVPB  Status:  Discontinued        1 g 100 mL/hr over 30 Minutes Intravenous Every 24 hours 03/27/12 0153 03/30/12 0930   03/26/12 2145   cefTRIAXone (ROCEPHIN) 1 g in dextrose 5 % 50 mL IVPB        1 g 100 mL/hr over 30 Minutes Intravenous  Once 03/26/12 2131 03/26/12 2323         Assessment:  71 yo female with UTI-multiple species final culture  Treated with 4 days Rocephin, WBC continues elevated, low grade temps.  Abdominal U/S with hepatic mass vs abscess. GI following, plan work-up  Antibiotics changed to Zosyn per pharmacy  Goal of Therapy:  Antibiotic dose/schedule based on renal function, cultures.  Plan:  Zosyn 3.375 gm q8hr Follow cultures, biopsy  Otho Bellows PharmD Pager [phone] 03/30/2012,1:04 PM

## 2012-03-30 NOTE — Progress Notes (Signed)
[name] Progress Note  SUBJECTIVE: feels okay  OBJECTIVE:  Vital signs in last 24 hours: Temp:  [98.1 F (36.7 C)-99.8 F (37.7 C)] 98.1 F (36.7 C) (08/15 0605) Pulse Rate:  [70-74] 72  (08/15 0605) Resp:  [18-20] 20  (08/15 0605) BP: (108-134)/(51-68) 117/61 mmHg (08/15 0605) SpO2:  [92 %-98 %] 98 % (08/15 0605) Last BM Date: 03/28/12 General:    Pleasant black female in NAD Heart:  Regular rate and rhythm Abdomen:  Soft, nontender and nondistended. Normal bowel sounds. Extremities:  Without edema. Neurologic:  Alert and oriented,  grossly normal neurologically. Psych:  Cooperative. Normal mood and affect.    Lab Results:  Basename 03/30/12 0333 03/29/12 0325 03/28/12 0345  WBC 15.5* 17.4* 19.7*  HGB 10.2* 9.6* 9.4*  HCT 29.1* 27.5* 26.8*  PLT 467* 408* 330   BMET  Basename 03/30/12 0333 03/29/12 0325 03/28/12 0345  NA 138 141 139  K 4.0 3.3* 4.5  CL 101 104 105  CO2 24 25 23   GLUCOSE 108* 132* 107*  BUN 21 27* 35*  CREATININE 1.22* 1.37* 1.49*  CALCIUM 8.4 8.4 8.3*   LFT  Basename 03/29/12 0325  PROT 6.8  ALBUMIN 2.1*  AST 71*  ALT 56*  ALKPHOS 165*  BILITOT 4.1*  BILIDIR --  IBILI --    Studies/Results: Mr Abdomen W Wo Contrast  03/30/2012  **ADDENDUM** CREATED: 03/30/2012 09:10:04  The following was omitted from the findings section of the original report:  Additional 1.2 cm short axis enhancing nodule adjacent to the gallbladder neck (series 1303/image 64), possibly reflecting a small lymph node.  The impression remains unchanged.  **END ADDENDUM** SIGNED BY: [name], M.D.   03/30/2012  *RADIOLOGY REPORT*  Clinical Data: Follow-up liver lesion on ultrasound, history of breast cancer  MRI ABDOMEN WITH AND WITHOUT CONTRAST  Technique:  Multiplanar multisequence MR imaging of the abdomen was performed both before and after administration of intravenous contrast.  Contrast: 10mL MULTIHANCE GADOBENATE DIMEGLUMINE 529 MG/ML IV SOLN   Comparison: Ultrasound abdomen dated 03/26/2012  Findings: Motion degraded images.  11.5 x 8.3 x 9.2 cm complex cystic lesion with numerous enhancing septations in the right hepatic lobe (series 3/image 14), corresponding to the sonographic abnormality.  In the appropriate clinical setting, the appearance would be compatible with hepatic abscess, although there is no definite perilesional edema to support this.  Alternatively, while this appearance would be unusual for metastasis, a primary biliary cystadenocarcinoma could have this appearance (statistically much less common).  Mild hepatic steatosis.  Spleen, pancreas, and right adrenal gland are within normal limits. Mild nodularity of the left adrenal gland with signal loss on opposed phase imaging, likely reflecting adenomatous hyperplasia.  Cholelithiasis, without associated inflammatory changes.  No intrahepatic or extrahepatic ductal dilatation.  Subcentimeter left upper pole renal cyst (series 11/image 13). Right kidney is within normal limits.  No hydronephrosis.  No abdominal ascites.  Borderline enlarged upper abdominal nodes, including a 12 mm short- axis node along the falciform ligament (series 3/image 27) and a 9 mm short-axis aortocaval node (series 3/image 29).  No focal osseous lesions.  IMPRESSION: Motion degraded images.  11.5 cm complex cystic lesion with enhancing septations in the right hepatic lobe, possibly reflecting a hepatic abscess or primary neoplasm such as biliary cystadenocarcinoma (statistically less likely).  The appearance is considered unusual for metastasis.  Borderline upper abdominal lymphadenopathy, as described above.  Cholelithiasis, without associated inflammatory changes.  Mild hepatic steatosis. Original Report Authenticated By: [name],  M.D.   US Renal  03/29/2012  *RADIOLOGY REPORT*  Clinical Data: 71 year old female with acute renal failure.  RENAL/URINARY TRACT ULTRASOUND COMPLETE  Comparison:  Abdomen  ultrasound 03/26/2012 and earlier.  Findings:  Right Kidney:  No hydronephrosis.  Renal length stable, currently measured at 10.1 cm.  Renal cortical thinning but preserved cortical echotexture.  Left Kidney:  No hydronephrosis.  Stable renal length measured at 11.1 cm.  Better preserved cortical thickness and cortical echotexture within normal limits.  Bladder:  Unremarkable.  IMPRESSION: No acute renal findings.  Original Report Authenticated By: [name], M.D.     ASSESSMENT / PLAN:   1. Liver lesion, 11.5 cm complex cystic lesion with enhancing septations in the right hepatic lobe, possibly reflecting a hepatic abscess, primary neoplasm such as biliary cystadenocarcinoma. Lesion will probably need to be biopsied or an FNA is cyst.  2. Leukocytosis, WBC trending down. Patient had UTI, antibiotics completed.   3.. Microcytic anemia. Acute drop in hemoglobin from 14.5 to 10.7 to 9.4 yesterday. Stable. Hemoglobin trending upward, 10.2 today. No overt GIB.        LOS: 4 days   [name]  03/30/2012, 9:45 AM

## 2012-03-30 NOTE — Progress Notes (Signed)
[facility]                                                                                Patient Demographics  Chelsey Savage, is a 71 y.o. female  ZOX:[id number]  [id number]  DOB - May 21, 1941  Admit date - 03/26/2012  Admitting Physician Levie Heritage, DO  Outpatient Primary MD for the patient is No primary provider on file.  LOS - 4   Chief Complaint  Patient presents with  . Failure To Thrive  . Weakness  . Fatigue        Assessment & Plan    Brief narrative:  71 -year-old pleasant female with past medical history of hypertension, hypothyroidism who was admitted 03/26/2012 with working diagnosis of urinary tract infection secondary to possible dehydration. Patient was started on ceftriaxone however her white blood cell count continues to rise in addition to low grade fevers. Evaluation in emergency department included abdominal ultrasound with findings of 10.3 cm hepatic mass suspicious for malignant lesion. Gastroenterology consulted for an input on management.      UTI (lower urinary tract infection) with leukocytosis and low-grade fever Clinically stable question if fever and leukocytosis do to liver lesion, new urine cultures for better sampling, IV Zosyn for now to continue.     Hepatic mass suspicious for a malignant lesion  Discussed with GI bedside abdominal MRI suggestive of possible hepatic abscess versus complex cyst, ultrasound-guided liver biopsy/aspiration ordered, INR is stable, patient does not have pain in the right upper quadrant which raises suspicion for malignancy rather than bacterial abscess. For now patient has been switched to IV Zosyn will monitor aspiration results.     Acute kidney injury  Likely prerenal dehydration almost resolved with IV fluids.    Hypertension  Will hold HCTZ , or pressure on the soft side we'll discontinue hydralazine    Hypothyroid  continue synthroid , check TSH free T3 and free T4  level since she feels weak all over.   Hypokalemia  Replace and recheck in the morning    DVT prophylaxis  SCDs bilaterally will add heparin    Code Status: Full  Family Communication: Discussed with son bedside on 03/29/2012  Disposition Plan: Home    Procedures abdominal MRI , ultrasound-guided liver biopsy/aspiration by interventional radiology   Consults GI   Antibiotics  Rocephin stopped on 03/30/2012 and started on Zosyn   Time Spent in minutes   30   DVT Prophylaxis  Lovenox     Leroy Sea M.D on 03/30/2012 at 12:41 PM  Between 7am to 7pm - Pager - [phone]  After 7pm go to [email or web address] - password [email or web address]  And look for the night coverage person covering for me after hours  [facility] Office  [phone]    Subjective:   Chelsey Savage today has, No headache, No chest pain, No abdominal pain - No Nausea, No new weakness tingling or numbness, No Cough - SOB.   Objective:   Filed Vitals:   03/29/12 0538 03/29/12 1524 03/29/12 2119 03/30/12 0605  BP: 99/64 134/51 108/68 117/61  Pulse: 68 70 74 72  Temp: 98.9 F (37.2 C) 99.8 F (37.7 C) 98.9  F (37.2 C) 98.1 F (36.7 C)  TempSrc: Oral Oral Oral Oral  Resp: 16 18 20 20   Height:      Weight:      SpO2: 97% 92% 96% 98%    Wt Readings from Last 3 Encounters:  03/27/12 109.68 kg (241 lb 12.8 oz)     Intake/Output Summary (Last 24 hours) at 03/30/12 1241 Last data filed at 03/30/12 0700  Gross per 24 hour  Intake   1050 ml  Output      0 ml  Net   1050 ml    Exam Awake Alert, Oriented X 3, No new F.N deficits, Normal affect [place].AT,PERRAL Supple Neck,No JVD, No cervical lymphadenopathy appriciated.  Symmetrical Chest wall movement, Good air movement bilaterally, CTAB RRR,No Gallops,Rubs or new Murmurs, No Parasternal Heave +ve B.Sounds, Abd Soft, Non tender, No organomegaly appriciated, No rebound - guarding or rigidity. No Cyanosis, Clubbing or edema, No new  Rash or bruise      Data Review   CBC  Lab 03/30/12 0333 03/29/12 0325 03/28/12 0345 03/27/12 0530 03/26/12 1817  WBC 15.5* 17.4* 19.7* 18.4* 13.4*  HGB 10.2* 9.6* 9.4* 10.7* 14.5  HCT 29.1* 27.5* 26.8* 29.4* 41.3  PLT 467* 408* 330 282 237  MCV 78.9 78.6 79.1 77.2* 78.5  MCH 27.6 27.4 27.7 28.1 27.6  MCHC 35.1 34.9 35.1 36.4* 35.1  RDW 13.3 13.3 13.3 13.2 13.2  LYMPHSABS -- -- -- -- 1.3  MONOABS -- -- -- -- 1.5*  EOSABS -- -- -- -- 0.0  BASOSABS -- -- -- -- 0.1  BANDABS -- -- -- -- --    Chemistries   Lab 03/30/12 0333 03/29/12 0325 03/28/12 0345 03/27/12 0530 03/26/12 1817  NA 138 141 139 139 137  K 4.0 3.3* 4.5 3.2* 3.0*  CL 101 104 105 97 95*  CO2 24 25 23 26 27   GLUCOSE 108* 132* 107* 125* 126*  BUN 21 27* 35* 46* 51*  CREATININE 1.22* 1.37* 1.49* 1.84* 2.32*  CALCIUM 8.4 8.4 8.3* 9.0 9.7  MG -- -- -- -- --  AST -- 71* -- -- 61*  ALT -- 56* -- -- 58*  ALKPHOS -- 165* -- -- 147*  BILITOT -- 4.1* -- -- 3.8*   ------------------------------------------------------------------------------------------------------------------ estimated creatinine clearance is 53.1 ml/min (by C-G formula based on Cr of 1.22). ------------------------------------------------------------------------------------------------------------------ No results found for this basename: HGBA1C:2 in the last 72 hours ------------------------------------------------------------------------------------------------------------------ No results found for this basename: CHOL:2,HDL:2,LDLCALC:2,TRIG:2,CHOLHDL:2,LDLDIRECT:2 in the last 72 hours ------------------------------------------------------------------------------------------------------------------  [facility] 03/30/12 0333  TSH 6.261*  T4TOTAL --  T3FREE 1.2*  THYROIDAB --   ------------------------------------------------------------------------------------------------------------------ No results found for this basename:  VITAMINB12:2,FOLATE:2,FERRITIN:2,TIBC:2,IRON:2,RETICCTPCT:2 in the last 72 hours  Coagulation profile  Lab 03/30/12 1015  INR 1.19  PROTIME --    No results found for this basename: DDIMER:2 in the last 72 hours  Cardiac Enzymes No results found for this basename: CK:3,CKMB:3,TROPONINI:3,MYOGLOBIN:3 in the last 168 hours ------------------------------------------------------------------------------------------------------------------ No components found with this basename: POCBNP:3  Micro Results Recent Results (from the past 240 hour(s))  URINE CULTURE     Status: Normal   Collection Time   03/26/12  7:41 PM      Component Value Range Status Comment   Specimen Description URINE, CLEAN CATCH   Final    Special Requests NONE   Final    Culture  Setup Time 03/27/2012 10:28   Final    Colony Count 90,000 COLONIES/ML   Final    Culture  Final    Value: Multiple bacterial morphotypes present, none predominant. Suggest appropriate recollection if clinically indicated.   Report Status 03/28/2012 FINAL   Final     Radiology Reports Dg Chest 2 View  03/26/2012  *RADIOLOGY REPORT*  Clinical Data: Failure to thrive  CHEST - 2 VIEW  Comparison: None.  Findings: Previous right mastectomy with vascular clips near the anterolateral chest wall.  Mild central pulmonary vascular congestion.  Mild cardiomegaly.  Tortuous atheromatous aorta.  No effusion.  Minimal lower thoracic spondylitic changes.  IMPRESSION:  Mild cardiomegaly and central pulmonary vascular congestion.  Original Report Authenticated By: Osa Craver, M.D.   US Abdomen Complete  03/26/2012  *RADIOLOGY REPORT*  Clinical Data:  Elevated liver function test.  Gallstones.  Nausea and fever.  COMPLETE ABDOMINAL ULTRASOUND  Comparison:  None.  Findings:  Gallbladder:  Cholelithiasis.  No sonographic Murphy's sign.  The largest gallstone measures 15 mm.  Gallbladder wall is borderline, ranging from 2 mm to 4 mm.  On the  decubitus film, gallbladder is packed with gallstones with multiple echogenic foci.  Common bile duct:  Normal at 5 mm.  Portions of the common bile duct are obscured.  Liver:  There is a complex heterogeneous mass at the right posterior hepatic lobe measuring 10.3 cm x 10.2 cm x 9.2 cm.  This has internal vascular flow.  Although nonspecific, this is suspicious for neoplasm or abscess.  CT abdomen and pelvis with contrast recommended for further assessment.  IVC:  Appears normal.  Pancreas:  Partially obscured by overlying bowel gas.  Spleen:  5.8 cm. Normal echotexture.  Normal central sinus echo complex.  No calculi or hydronephrosis.  Right Kidney:  11.9 cm.  Renal cortical thinning compatible with medical renal disease.  Left Kidney:  11.9 cm.  Renal cortical thinning compatible with medical renal disease.  Abdominal aorta:  Suboptimal visualization.  26 mm in the upper abdomen.  IMPRESSION: 1.  Cholelithiasis without convincing evidence of acute cholecystitis.  No sonographic Murphy's sign.  Borderline wall thickening. 2.  10.3 cm mass in the right hepatic lobe.  Follow-up CT with infusion recommended for further assessment, depending on renal function.  Alternatively, nonemergent MRI may be useful for further characterization. 3.  Renal cortical thinning compatible with medical renal disease. No stones or hydronephrosis.  Original Report Authenticated By: Andreas Newport, M.D.    Scheduled Meds:    . feeding supplement  237 mL Oral QPC supper  . heparin subcutaneous  5,000 Units Subcutaneous Q8H  . levothyroxine  100 mcg Oral Daily  . loratadine  10 mg Oral Daily  . piperacillin-tazobactam (ZOSYN)  IV  3.375 g Intravenous Q8H  . potassium chloride  40 mEq Oral Once  . DISCONTD: cefTRIAXone (ROCEPHIN)  IV  1 g Intravenous Q24H  . DISCONTD: hydrALAZINE  25 mg Oral Q8H   Continuous Infusions:    . sodium chloride 50 mL/hr at 03/29/12 1630  . DISCONTD: sodium chloride Stopped (03/28/12 0730)    PRN Meds:.acetaminophen, acetaminophen, alum & mag hydroxide-simeth, docusate sodium, gadobenate dimeglumine

## 2012-03-30 NOTE — Progress Notes (Signed)
Patient ID: Chelsey Savage, female   DOB: 06-17-1941, 71 y.o.   MRN: [id number] Request received for US guided right hepatic lobe complex cyst/? Abscess aspiration/possible drainage today. PMH as below. Exam: chest- CTA bilat, heart- RRR, abd obese, soft, + BS,NT.  Filed Vitals:   03/29/12 0538 03/29/12 1524 03/29/12 2119 03/30/12 0605  BP: 99/64 134/51 108/68 117/61  Pulse: 68 70 74 72  Temp: 98.9 F (37.2 C) 99.8 F (37.7 C) 98.9 F (37.2 C) 98.1 F (36.7 C)  TempSrc: Oral Oral Oral Oral  Resp: 16 18 20 20   Height:      Weight:      SpO2: 97% 92% 96% 98%   Past Medical History  Diagnosis Date  . Hypothyroid   . Hypertension   . Breast cancer   . Allergy    Past Surgical History  Procedure Date  . Mastectomy    Dg Chest 2 View  03/26/2012  *RADIOLOGY REPORT*  Clinical Data: Failure to thrive  CHEST - 2 VIEW  Comparison: None.  Findings: Previous right mastectomy with vascular clips near the anterolateral chest wall.  Mild central pulmonary vascular congestion.  Mild cardiomegaly.  Tortuous atheromatous aorta.  No effusion.  Minimal lower thoracic spondylitic changes.  IMPRESSION:  Mild cardiomegaly and central pulmonary vascular congestion.  Original Report Authenticated By: Osa Craver, M.D.   Mr Abdomen W Wo Contrast  03/30/2012  **ADDENDUM** CREATED: 03/30/2012 09:10:04  The following was omitted from the findings section of the original report:  Additional 1.2 cm short axis enhancing nodule adjacent to the gallbladder neck (series 1303/image 64), possibly reflecting a small lymph node.  The impression remains unchanged.  **END ADDENDUM** SIGNED BY: [name], M.D.   03/30/2012  *RADIOLOGY REPORT*  Clinical Data: Follow-up liver lesion on ultrasound, history of breast cancer  MRI ABDOMEN WITH AND WITHOUT CONTRAST  Technique:  Multiplanar multisequence MR imaging of the abdomen was performed both before and after administration of intravenous contrast.  Contrast:  10mL MULTIHANCE GADOBENATE DIMEGLUMINE 529 MG/ML IV SOLN  Comparison: Ultrasound abdomen dated 03/26/2012  Findings: Motion degraded images.  11.5 x 8.3 x 9.2 cm complex cystic lesion with numerous enhancing septations in the right hepatic lobe (series 3/image 14), corresponding to the sonographic abnormality.  In the appropriate clinical setting, the appearance would be compatible with hepatic abscess, although there is no definite perilesional edema to support this.  Alternatively, while this appearance would be unusual for metastasis, a primary biliary cystadenocarcinoma could have this appearance (statistically much less common).  Mild hepatic steatosis.  Spleen, pancreas, and right adrenal gland are within normal limits. Mild nodularity of the left adrenal gland with signal loss on opposed phase imaging, likely reflecting adenomatous hyperplasia.  Cholelithiasis, without associated inflammatory changes.  No intrahepatic or extrahepatic ductal dilatation.  Subcentimeter left upper pole renal cyst (series 11/image 13). Right kidney is within normal limits.  No hydronephrosis.  No abdominal ascites.  Borderline enlarged upper abdominal nodes, including a 12 mm short- axis node along the falciform ligament (series 3/image 27) and a 9 mm short-axis aortocaval node (series 3/image 29).  No focal osseous lesions.  IMPRESSION: Motion degraded images.  11.5 cm complex cystic lesion with enhancing septations in the right hepatic lobe, possibly reflecting a hepatic abscess or primary neoplasm such as biliary cystadenocarcinoma (statistically less likely).  The appearance is considered unusual for metastasis.  Borderline upper abdominal lymphadenopathy, as described above.  Cholelithiasis, without associated inflammatory changes.  Mild hepatic  steatosis. Original Report Authenticated By: [name], M.D.   US Abdomen Complete  03/26/2012  *RADIOLOGY REPORT*  Clinical Data:  Elevated liver function test.   Gallstones.  Nausea and fever.  COMPLETE ABDOMINAL ULTRASOUND  Comparison:  None.  Findings:  Gallbladder:  Cholelithiasis.  No sonographic Murphy's sign.  The largest gallstone measures 15 mm.  Gallbladder wall is borderline, ranging from 2 mm to 4 mm.  On the decubitus film, gallbladder is packed with gallstones with multiple echogenic foci.  Common bile duct:  Normal at 5 mm.  Portions of the common bile duct are obscured.  Liver:  There is a complex heterogeneous mass at the right posterior hepatic lobe measuring 10.3 cm x 10.2 cm x 9.2 cm.  This has internal vascular flow.  Although nonspecific, this is suspicious for neoplasm or abscess.  CT abdomen and pelvis with contrast recommended for further assessment.  IVC:  Appears normal.  Pancreas:  Partially obscured by overlying bowel gas.  Spleen:  5.8 cm. Normal echotexture.  Normal central sinus echo complex.  No calculi or hydronephrosis.  Right Kidney:  11.9 cm.  Renal cortical thinning compatible with medical renal disease.  Left Kidney:  11.9 cm.  Renal cortical thinning compatible with medical renal disease.  Abdominal aorta:  Suboptimal visualization.  26 mm in the upper abdomen.  IMPRESSION: 1.  Cholelithiasis without convincing evidence of acute cholecystitis.  No sonographic Murphy's sign.  Borderline wall thickening. 2.  10.3 cm mass in the right hepatic lobe.  Follow-up CT with infusion recommended for further assessment, depending on renal function.  Alternatively, nonemergent MRI may be useful for further characterization. 3.  Renal cortical thinning compatible with medical renal disease. No stones or hydronephrosis.  Original Report Authenticated By: Andreas Newport, M.D.   US Renal  03/29/2012  *RADIOLOGY REPORT*  Clinical Data: 71 year old female with acute renal failure.  RENAL/URINARY TRACT ULTRASOUND COMPLETE  Comparison:  Abdomen ultrasound 03/26/2012 and earlier.  Findings:  Right Kidney:  No hydronephrosis.  Renal length stable,  currently measured at 10.1 cm.  Renal cortical thinning but preserved cortical echotexture.  Left Kidney:  No hydronephrosis.  Stable renal length measured at 11.1 cm.  Better preserved cortical thickness and cortical echotexture within normal limits.  Bladder:  Unremarkable.  IMPRESSION: No acute renal findings.  Original Report Authenticated By: [name], M.D.  Results for orders placed during the hospital encounter of 03/26/12  CBC WITH DIFFERENTIAL      Component Value Range   WBC 13.4 (*) 4.0 - 10.5 K/uL   RBC 5.26 (*) 3.87 - 5.11 MIL/uL   Hemoglobin 14.5  12.0 - 15.0 g/dL   HCT 11.9  14.7 - 82.9 %   MCV 78.5  78.0 - 100.0 fL   MCH 27.6  26.0 - 34.0 pg   MCHC 35.1  30.0 - 36.0 g/dL   RDW 56.2  13.0 - 86.5 %   Platelets 237  150 - 400 K/uL   Neutrophils Relative 78 (*) 43 - 77 %   Lymphocytes Relative 10 (*) 12 - 46 %   Monocytes Relative 11  3 - 12 %   Eosinophils Relative 0  0 - 5 %   Basophils Relative 1  0 - 1 %   Neutro Abs 10.5 (*) 1.7 - 7.7 K/uL   Lymphs Abs 1.3  0.7 - 4.0 K/uL   Monocytes Absolute 1.5 (*) 0.1 - 1.0 K/uL   Eosinophils Absolute 0.0  0.0 - 0.7 K/uL  Basophils Absolute 0.1  0.0 - 0.1 K/uL   RBC Morphology TARGET CELLS     Smear Review PLATELET CLUMPS NOTED ON SMEAR    COMPREHENSIVE METABOLIC PANEL      Component Value Range   Sodium 137  135 - 145 mEq/L   Potassium 3.0 (*) 3.5 - 5.1 mEq/L   Chloride 95 (*) 96 - 112 mEq/L   CO2 27  19 - 32 mEq/L   Glucose, Bld 126 (*) 70 - 99 mg/dL   BUN 51 (*) 6 - 23 mg/dL   Creatinine, Ser 1.61 (*) 0.50 - 1.10 mg/dL   Calcium 9.7  8.4 - 09.6 mg/dL   Total Protein 7.6  6.0 - 8.3 g/dL   Albumin 2.6 (*) 3.5 - 5.2 g/dL   AST 61 (*) 0 - 37 U/L   ALT 58 (*) 0 - 35 U/L   Alkaline Phosphatase 147 (*) 39 - 117 U/L   Total Bilirubin 3.8 (*) 0.3 - 1.2 mg/dL   GFR calc non Af Amer 20 (*) >90 mL/min   GFR calc Af Amer 23 (*) >90 mL/min  URINALYSIS, ROUTINE W REFLEX MICROSCOPIC      Component Value Range   Color, Urine  ORANGE (*) YELLOW   APPearance TURBID (*) CLEAR   Specific Gravity, Urine 1.024  1.005 - 1.030   pH 5.0  5.0 - 8.0   Glucose, UA NEGATIVE  NEGATIVE mg/dL   Hgb urine dipstick NEGATIVE  NEGATIVE   Bilirubin Urine LARGE (*) NEGATIVE   Ketones, ur TRACE (*) NEGATIVE mg/dL   Protein, ur 30 (*) NEGATIVE mg/dL   Urobilinogen, UA 4.0 (*) 0.0 - 1.0 mg/dL   Nitrite POSITIVE (*) NEGATIVE   Leukocytes, UA LARGE (*) NEGATIVE  URINE MICROSCOPIC-ADD ON      Component Value Range   Squamous Epithelial / LPF FEW (*) RARE   WBC, UA TOO NUMEROUS TO COUNT  <3 WBC/hpf   RBC / HPF 0-2  <3 RBC/hpf   Bacteria, UA MANY (*) RARE   Urine-Other AMORPHOUS URATES/PHOSPHATES    URINE CULTURE      Component Value Range   Specimen Description URINE, CLEAN CATCH     Special Requests NONE     Culture  Setup Time 03/27/2012 10:28     Colony Count 90,000 COLONIES/ML     Culture       Value: Multiple bacterial morphotypes present, none predominant. Suggest appropriate recollection if clinically indicated.   Report Status 03/28/2012 FINAL    BASIC METABOLIC PANEL      Component Value Range   Sodium 139  135 - 145 mEq/L   Potassium 3.2 (*) 3.5 - 5.1 mEq/L   Chloride 97  96 - 112 mEq/L   CO2 26  19 - 32 mEq/L   Glucose, Bld 125 (*) 70 - 99 mg/dL   BUN 46 (*) 6 - 23 mg/dL   Creatinine, Ser 0.45 (*) 0.50 - 1.10 mg/dL   Calcium 9.0  8.4 - 40.9 mg/dL   GFR calc non Af Amer 26 (*) >90 mL/min   GFR calc Af Amer 31 (*) >90 mL/min  CBC      Component Value Range   WBC 18.4 (*) 4.0 - 10.5 K/uL   RBC 3.81 (*) 3.87 - 5.11 MIL/uL   Hemoglobin 10.7 (*) 12.0 - 15.0 g/dL   HCT 81.1 (*) 91.4 - 78.2 %   MCV 77.2 (*) 78.0 - 100.0 fL   MCH 28.1  26.0 - 34.0 pg  MCHC 36.4 (*) 30.0 - 36.0 g/dL   RDW 96.2  95.2 - 84.1 %   Platelets 282  150 - 400 K/uL  CBC      Component Value Range   WBC 19.7 (*) 4.0 - 10.5 K/uL   RBC 3.39 (*) 3.87 - 5.11 MIL/uL   Hemoglobin 9.4 (*) 12.0 - 15.0 g/dL   HCT 32.4 (*) 40.1 - 02.7 %   MCV  79.1  78.0 - 100.0 fL   MCH 27.7  26.0 - 34.0 pg   MCHC 35.1  30.0 - 36.0 g/dL   RDW 25.3  66.4 - 40.3 %   Platelets 330  150 - 400 K/uL  BASIC METABOLIC PANEL      Component Value Range   Sodium 139  135 - 145 mEq/L   Potassium 4.5  3.5 - 5.1 mEq/L   Chloride 105  96 - 112 mEq/L   CO2 23  19 - 32 mEq/L   Glucose, Bld 107 (*) 70 - 99 mg/dL   BUN 35 (*) 6 - 23 mg/dL   Creatinine, Ser 4.74 (*) 0.50 - 1.10 mg/dL   Calcium 8.3 (*) 8.4 - 10.5 mg/dL   GFR calc non Af Amer 34 (*) >90 mL/min   GFR calc Af Amer 40 (*) >90 mL/min  COMPREHENSIVE METABOLIC PANEL      Component Value Range   Sodium 141  135 - 145 mEq/L   Potassium 3.3 (*) 3.5 - 5.1 mEq/L   Chloride 104  96 - 112 mEq/L   CO2 25  19 - 32 mEq/L   Glucose, Bld 132 (*) 70 - 99 mg/dL   BUN 27 (*) 6 - 23 mg/dL   Creatinine, Ser 2.59 (*) 0.50 - 1.10 mg/dL   Calcium 8.4  8.4 - 56.3 mg/dL   Total Protein 6.8  6.0 - 8.3 g/dL   Albumin 2.1 (*) 3.5 - 5.2 g/dL   AST 71 (*) 0 - 37 U/L   ALT 56 (*) 0 - 35 U/L   Alkaline Phosphatase 165 (*) 39 - 117 U/L   Total Bilirubin 4.1 (*) 0.3 - 1.2 mg/dL   GFR calc non Af Amer 38 (*) >90 mL/min   GFR calc Af Amer 44 (*) >90 mL/min  CBC      Component Value Range   WBC 17.4 (*) 4.0 - 10.5 K/uL   RBC 3.50 (*) 3.87 - 5.11 MIL/uL   Hemoglobin 9.6 (*) 12.0 - 15.0 g/dL   HCT 87.5 (*) 64.3 - 32.9 %   MCV 78.6  78.0 - 100.0 fL   MCH 27.4  26.0 - 34.0 pg   MCHC 34.9  30.0 - 36.0 g/dL   RDW 51.8  84.1 - 66.0 %   Platelets 408 (*) 150 - 400 K/uL  BASIC METABOLIC PANEL      Component Value Range   Sodium 138  135 - 145 mEq/L   Potassium 4.0  3.5 - 5.1 mEq/L   Chloride 101  96 - 112 mEq/L   CO2 24  19 - 32 mEq/L   Glucose, Bld 108 (*) 70 - 99 mg/dL   BUN 21  6 - 23 mg/dL   Creatinine, Ser 6.30 (*) 0.50 - 1.10 mg/dL   Calcium 8.4  8.4 - 16.0 mg/dL   GFR calc non Af Amer 43 (*) >90 mL/min   GFR calc Af Amer 50 (*) >90 mL/min  CBC      Component Value Range   WBC 15.5 (*)  4.0 - 10.5 K/uL   RBC  3.69 (*) 3.87 - 5.11 MIL/uL   Hemoglobin 10.2 (*) 12.0 - 15.0 g/dL   HCT 16.1 (*) 09.6 - 04.5 %   MCV 78.9  78.0 - 100.0 fL   MCH 27.6  26.0 - 34.0 pg   MCHC 35.1  30.0 - 36.0 g/dL   RDW 40.9  81.1 - 91.4 %   Platelets 467 (*) 150 - 400 K/uL  T3, FREE      Component Value Range   T3, Free 1.2 (*) 2.3 - 4.2 pg/mL  T4, FREE      Component Value Range   Free T4 1.15  0.80 - 1.80 ng/dL  TSH      Component Value Range   TSH 6.261 (*) 0.350 - 4.500 uIU/mL  PROTIME-INR      Component Value Range   Prothrombin Time 15.4 (*) 11.6 - 15.2 seconds   INR 1.19  0.00 - 1.49   A/P: Pt with remote hx of breast cancer, elevated LFT's and recent imaging revealing an 11.5 cm complex cystic lesion with septations in right hepatic lobe (neoplasm vs abscess); plan is for US/CT guided aspiration /possible drainage of hepatic cystic lesion. Details/risks of procedure d/w pt with her understanding and consent.

## 2012-03-30 NOTE — Progress Notes (Signed)
I agree with the above documentation, including the assessment and plan. Await FNA results which will guide management.

## 2012-03-30 NOTE — Procedures (Signed)
US guided aspiration of liver lesion demonstrated yellow purulent fluid.  Placed 10 Fr drain with US guidance.  Removed 200 ml of fluid.  No immediate complication.

## 2012-03-31 ENCOUNTER — Inpatient Hospital Stay (HOSPITAL_COMMUNITY): Payer: Medicare Other

## 2012-03-31 LAB — CBC
Hemoglobin: 9.1 g/dL — ABNORMAL LOW (ref 12.0–15.0)
MCH: 27.7 pg (ref 26.0–34.0)
MCV: 79 fL (ref 78.0–100.0)
RBC: 3.28 MIL/uL — ABNORMAL LOW (ref 3.87–5.11)

## 2012-03-31 LAB — PROTEIN, BODY FLUID

## 2012-03-31 LAB — COMPREHENSIVE METABOLIC PANEL
AST: 41 U/L — ABNORMAL HIGH (ref 0–37)
Albumin: 1.9 g/dL — ABNORMAL LOW (ref 3.5–5.2)
Calcium: 8.1 mg/dL — ABNORMAL LOW (ref 8.4–10.5)
Creatinine, Ser: 1.22 mg/dL — ABNORMAL HIGH (ref 0.50–1.10)
GFR calc non Af Amer: 43 mL/min — ABNORMAL LOW (ref >90)

## 2012-03-31 LAB — URINE CULTURE
Colony Count: NO GROWTH
Culture: NO GROWTH

## 2012-03-31 LAB — GLUCOSE, SEROUS FLUID: Glucose, Fluid: <20 mg/dL

## 2012-03-31 MED ORDER — SODIUM CHLORIDE 0.9 % IJ SOLN: 10.0000 mL | INTRAMUSCULAR | Status: DC | PRN

## 2012-03-31 MED ORDER — OXYCODONE HCL 5 MG PO TABS
5.0000 mg | ORAL_TABLET | ORAL | Status: DC | PRN
  Administered 2012-03-31: 5 mg via ORAL
  Filled 2012-03-31: qty 1

## 2012-03-31 MED ORDER — MORPHINE SULFATE 2 MG/ML IJ SOLN: 1.0000 mg | INTRAMUSCULAR | Status: DC | PRN

## 2012-03-31 NOTE — Progress Notes (Signed)
Peripherally Inserted Central Catheter/Midline Placement  The IV Nurse has discussed with the patient and/or persons authorized to consent for the patient, the purpose of this procedure and the potential benefits and risks involved with this procedure.  The benefits include less needle sticks, lab draws from the catheter and patient may be discharged home with the catheter.  Risks include, but not limited to, infection, bleeding, blood clot (thrombus formation), and puncture of an artery; nerve damage and irregular heat beat.  Alternatives to this procedure were also discussed.  PICC/Midline Placement Documentation        Chelsey Savage 03/31/2012, 4:42 PM

## 2012-03-31 NOTE — Progress Notes (Signed)
Subjective: Pt feeling ok. Sitting up in chair eating breakfast, states her appetite is better. Denies pain at drain site.  Objective: Physical Exam: BP 117/47  Pulse 68  Temp 98.5 F (36.9 C) (Oral)  Resp 20  Ht 5\' 6"  (1.676 m)  Wt 241 lb 12.8 oz (109.68 kg)  BMI 39.03 kg/m2  SpO2 87% RUQ drain intact, site clean, NT Output yellow with purulent stranding. 190cc output recorded yesterday, about 25cc in bulb at present.  Labs: CBC  Basename 03/31/12 0343 03/30/12 0333  WBC 12.3* 15.5*  HGB 9.1* 10.2*  HCT 25.9* 29.1*  PLT 473* 467*   BMET  Basename 03/31/12 0343 03/30/12 0333  NA 137 138  K 4.0 4.0  CL 101 101  CO2 28 24  GLUCOSE 112* 108*  BUN 18 21  CREATININE 1.22* 1.22*  CALCIUM 8.1* 8.4   LFT  Basename 03/31/12 0343  PROT 6.1  ALBUMIN 1.9*  AST 41*  ALT 38*  ALKPHOS 139*  BILITOT 2.7*  BILIDIR --  IBILI --  LIPASE --   PT/INR  Basename 03/30/12 1015  LABPROT 15.4*  INR 1.19     Studies/Results: Mr Abdomen W Wo Contrast  03/30/2012  **ADDENDUM** CREATED: 03/30/2012 09:10:04  The following was omitted from the findings section of the original report:  Additional 1.2 cm short axis enhancing nodule adjacent to the gallbladder neck (series 1303/image 64), possibly reflecting a small lymph node.  The impression remains unchanged.  **END ADDENDUM** SIGNED BY: [name], M.D.   03/30/2012  *RADIOLOGY REPORT*  Clinical Data: Follow-up liver lesion on ultrasound, history of breast cancer  MRI ABDOMEN WITH AND WITHOUT CONTRAST  Technique:  Multiplanar multisequence MR imaging of the abdomen was performed both before and after administration of intravenous contrast.  Contrast: 10mL MULTIHANCE GADOBENATE DIMEGLUMINE 529 MG/ML IV SOLN  Comparison: Ultrasound abdomen dated 03/26/2012  Findings: Motion degraded images.  11.5 x 8.3 x 9.2 cm complex cystic lesion with numerous enhancing septations in the right hepatic lobe (series 3/image 14), corresponding to  the sonographic abnormality.  In the appropriate clinical setting, the appearance would be compatible with hepatic abscess, although there is no definite perilesional edema to support this.  Alternatively, while this appearance would be unusual for metastasis, a primary biliary cystadenocarcinoma could have this appearance (statistically much less common).  Mild hepatic steatosis.  Spleen, pancreas, and right adrenal gland are within normal limits. Mild nodularity of the left adrenal gland with signal loss on opposed phase imaging, likely reflecting adenomatous hyperplasia.  Cholelithiasis, without associated inflammatory changes.  No intrahepatic or extrahepatic ductal dilatation.  Subcentimeter left upper pole renal cyst (series 11/image 13). Right kidney is within normal limits.  No hydronephrosis.  No abdominal ascites.  Borderline enlarged upper abdominal nodes, including a 12 mm short- axis node along the falciform ligament (series 3/image 27) and a 9 mm short-axis aortocaval node (series 3/image 29).  No focal osseous lesions.  IMPRESSION: Motion degraded images.  11.5 cm complex cystic lesion with enhancing septations in the right hepatic lobe, possibly reflecting a hepatic abscess or primary neoplasm such as biliary cystadenocarcinoma (statistically less likely).  The appearance is considered unusual for metastasis.  Borderline upper abdominal lymphadenopathy, as described above.  Cholelithiasis, without associated inflammatory changes.  Mild hepatic steatosis. Original Report Authenticated By: [name], M.D.   US Renal  03/29/2012  *RADIOLOGY REPORT*  Clinical Data: 71 year old female with acute renal failure.  RENAL/URINARY TRACT ULTRASOUND COMPLETE  Comparison:  Abdomen ultrasound 03/26/2012  and earlier.  Findings:  Right Kidney:  No hydronephrosis.  Renal length stable, currently measured at 10.1 cm.  Renal cortical thinning but preserved cortical echotexture.  Left Kidney:  No  hydronephrosis.  Stable renal length measured at 11.1 cm.  Better preserved cortical thickness and cortical echotexture within normal limits.  Bladder:  Unremarkable.  IMPRESSION: No acute renal findings.  Original Report Authenticated By: [name], M.D.    Assessment/Plan: Complex hepatic lesion/abscess s/p aspiration/drain placement 8/15 Continue to follow output and labs.    LOS: 5 days    Brayton El PA-C 03/31/2012 9:23 AM

## 2012-03-31 NOTE — Progress Notes (Signed)
[facility]                                                                                Patient Demographics  Chelsey Savage, is a 71 y.o. female  ZOX:[id number]  [id number]  DOB - 09/17/1940  Admit date - 03/26/2012  Admitting Physician Levie Heritage, DO  Outpatient Primary MD for the patient is No primary provider on file.  LOS - 5   Chief Complaint  Patient presents with  . Failure To Thrive  . Weakness  . Fatigue        Assessment & Plan    Brief narrative:  29 -year-old pleasant female with past medical history of hypertension, hypothyroidism who was admitted 03/26/2012 with working diagnosis of urinary tract infection secondary to possible dehydration. Patient was started on ceftriaxone however her white blood cell count continues to rise in addition to low grade fevers. Evaluation in emergency department included abdominal ultrasound with findings of 10.3 cm hepatic mass suspicious for malignant lesion. Gastroenterology consulted for an input on management.    Generalized weakness, anorexia, leukocytosis do to hepatic abscess found on MRI  Patient is status post ultrasound guided abscess drainage on 03/30/2012 by IR, she has a drain in place, she is on IV Zosyn, clinically much better appetite is already improved, generalized weakness is better, she is afebrile pain-free, will await cultures and will continue on IV Zosyn for now. Rest with Dr. Luciana Axe infectious disease who agrees with 10-14 days of IV antibiotics through Midline then close outpatient followup with GI on oral antibiotics.    UTI (lower urinary tract infection) with leukocytosis and low-grade fever Clinically stable question if fever and leukocytosis do to liver lesion, new urine cultures for better sampling, IV Zosyn for now to continue.     Acute kidney injury  Prerenal do to dehydration from decreased by mouth intake which is  almost resolved with IV fluids. Continue  outpatient monitoring avoid nephrotoxic agents.    Hypertension  Will hold HCTZ , pressures are stable.    Hypothyroid  continue synthroid , order lower high TSH and slightly low free T3 with a stable free T4, and now we'll continue home dose Synthroid TSH free T3 and T4 can be repeated in 4-6 weeks as outpatient once her acute illness is much better.   Hypokalemia  Replace and stable    DVT prophylaxis  SCDs bilaterally along with heparin    Code Status: Full  Family Communication: Discussed with son bedside on 03/29/2012  Disposition Plan: Home    Procedures abdominal MRI , ultrasound-guided liver biopsy/aspiration by interventional radiology on 03/30/2012   Consults GI   Antibiotics  Rocephin stopped on 03/30/2012 and started on Zosyn   Time Spent in minutes   30   DVT Prophylaxis  Lovenox     Leroy Sea M.D on 03/31/2012 at 11:27 AM  Between 7am to 7pm - Pager - [phone]  After 7pm go to [email or web address] - password [email or web address]  And look for the night coverage person covering for me after hours  [facility] Office  [phone]    Subjective:   Chelsey Savage today has, No headache, No chest  pain, No abdominal pain - No Nausea, No new weakness tingling or numbness, No Cough - SOB.   Objective:   Filed Vitals:   03/31/12 0203 03/31/12 0515 03/31/12 0905 03/31/12 1010  BP:  117/47    Pulse:  68    Temp:  98.5 F (36.9 C)    TempSrc:  Oral    Resp:  20    Height:      Weight:      SpO2: 96% 98% 87% 94%    Wt Readings from Last 3 Encounters:  03/27/12 109.68 kg (241 lb 12.8 oz)     Intake/Output Summary (Last 24 hours) at 03/31/12 1127 Last data filed at 03/31/12 0900  Gross per 24 hour  Intake    711 ml  Output   1290 ml  Net   -579 ml    Exam Awake Alert, Oriented X 3, No new F.N deficits, Normal affect [place].AT,PERRAL Supple Neck,No JVD, No cervical lymphadenopathy appriciated.  Symmetrical Chest wall movement,  Good air movement bilaterally, CTAB RRR,No Gallops,Rubs or new Murmurs, No Parasternal Heave +ve B.Sounds, Abd Soft, Non tender, No organomegaly appriciated, No rebound - guarding or rigidity. No Cyanosis, Clubbing or edema, No new Rash or bruise      Data Review   CBC  Lab 03/31/12 0343 03/30/12 0333 03/29/12 0325 03/28/12 0345 03/27/12 0530 03/26/12 1817  WBC 12.3* 15.5* 17.4* 19.7* 18.4* --  HGB 9.1* 10.2* 9.6* 9.4* 10.7* --  HCT 25.9* 29.1* 27.5* 26.8* 29.4* --  PLT 473* 467* 408* 330 282 --  MCV 79.0 78.9 78.6 79.1 77.2* --  MCH 27.7 27.6 27.4 27.7 28.1 --  MCHC 35.1 35.1 34.9 35.1 36.4* --  RDW 13.3 13.3 13.3 13.3 13.2 --  LYMPHSABS -- -- -- -- -- 1.3  MONOABS -- -- -- -- -- 1.5*  EOSABS -- -- -- -- -- 0.0  BASOSABS -- -- -- -- -- 0.1  BANDABS -- -- -- -- -- --    Chemistries   Lab 03/31/12 0343 03/30/12 0333 03/29/12 0325 03/28/12 0345 03/27/12 0530 03/26/12 1817  NA 137 138 141 139 139 --  K 4.0 4.0 3.3* 4.5 3.2* --  CL 101 101 104 105 97 --  CO2 28 24 25 23 26  --  GLUCOSE 112* 108* 132* 107* 125* --  BUN 18 21 27* 35* 46* --  CREATININE 1.22* 1.22* 1.37* 1.49* 1.84* --  CALCIUM 8.1* 8.4 8.4 8.3* 9.0 --  MG -- -- -- -- -- --  AST 41* -- 71* -- -- 61*  ALT 38* -- 56* -- -- 58*  ALKPHOS 139* -- 165* -- -- 147*  BILITOT 2.7* -- 4.1* -- -- 3.8*   ------------------------------------------------------------------------------------------------------------------ estimated creatinine clearance is 53.1 ml/min (by C-G formula based on Cr of 1.22). ------------------------------------------------------------------------------------------------------------------ No results found for this basename: HGBA1C:2 in the last 72 hours ------------------------------------------------------------------------------------------------------------------ No results found for this basename: CHOL:2,HDL:2,LDLCALC:2,TRIG:2,CHOLHDL:2,LDLDIRECT:2 in the last 72  hours ------------------------------------------------------------------------------------------------------------------  [facility] 03/30/12 0333  TSH 6.261*  T4TOTAL --  T3FREE 1.2*  THYROIDAB --   ------------------------------------------------------------------------------------------------------------------ No results found for this basename: VITAMINB12:2,FOLATE:2,FERRITIN:2,TIBC:2,IRON:2,RETICCTPCT:2 in the last 72 hours  Coagulation profile  Lab 03/30/12 1015  INR 1.19  PROTIME --    No results found for this basename: DDIMER:2 in the last 72 hours  Cardiac Enzymes No results found for this basename: CK:3,CKMB:3,TROPONINI:3,MYOGLOBIN:3 in the last 168 hours ------------------------------------------------------------------------------------------------------------------ No components found with this basename: POCBNP:3  Micro Results Recent Results (from the past 240 hour(s))  URINE CULTURE     Status: Normal   Collection Time   03/26/12  7:41 PM      Component Value Range Status Comment   Specimen Description URINE, CLEAN CATCH   Final    Special Requests NONE   Final    Culture  Setup Time 03/27/2012 10:28   Final    Colony Count 90,000 COLONIES/ML   Final    Culture     Final    Value: Multiple bacterial morphotypes present, none predominant. Suggest appropriate recollection if clinically indicated.   Report Status 03/28/2012 FINAL   Final   CULTURE, ROUTINE-ABSCESS     Status: Normal (Preliminary result)   Collection Time   03/30/12  4:48 PM      Component Value Range Status Comment   Specimen Description ABSCESS LIVER   Final    Special Requests NONE   Final    Gram Stain     Final    Value: ABUNDANT WBC PRESENT,BOTH PMN AND MONONUCLEAR     NO SQUAMOUS EPITHELIAL CELLS SEEN     NO ORGANISMS SEEN   Culture NO GROWTH 1 DAY   Final    Report Status PENDING   Incomplete     Radiology Reports Dg Chest 2 View  03/26/2012  *RADIOLOGY REPORT*  Clinical Data:  Failure to thrive  CHEST - 2 VIEW  Comparison: None.  Findings: Previous right mastectomy with vascular clips near the anterolateral chest wall.  Mild central pulmonary vascular congestion.  Mild cardiomegaly.  Tortuous atheromatous aorta.  No effusion.  Minimal lower thoracic spondylitic changes.  IMPRESSION:  Mild cardiomegaly and central pulmonary vascular congestion.  Original Report Authenticated By: Osa Craver, M.D.   US Abdomen Complete  03/26/2012  *RADIOLOGY REPORT*  Clinical Data:  Elevated liver function test.  Gallstones.  Nausea and fever.  COMPLETE ABDOMINAL ULTRASOUND  Comparison:  None.  Findings:  Gallbladder:  Cholelithiasis.  No sonographic Murphy's sign.  The largest gallstone measures 15 mm.  Gallbladder wall is borderline, ranging from 2 mm to 4 mm.  On the decubitus film, gallbladder is packed with gallstones with multiple echogenic foci.  Common bile duct:  Normal at 5 mm.  Portions of the common bile duct are obscured.  Liver:  There is a complex heterogeneous mass at the right posterior hepatic lobe measuring 10.3 cm x 10.2 cm x 9.2 cm.  This has internal vascular flow.  Although nonspecific, this is suspicious for neoplasm or abscess.  CT abdomen and pelvis with contrast recommended for further assessment.  IVC:  Appears normal.  Pancreas:  Partially obscured by overlying bowel gas.  Spleen:  5.8 cm. Normal echotexture.  Normal central sinus echo complex.  No calculi or hydronephrosis.  Right Kidney:  11.9 cm.  Renal cortical thinning compatible with medical renal disease.  Left Kidney:  11.9 cm.  Renal cortical thinning compatible with medical renal disease.  Abdominal aorta:  Suboptimal visualization.  26 mm in the upper abdomen.  IMPRESSION: 1.  Cholelithiasis without convincing evidence of acute cholecystitis.  No sonographic Murphy's sign.  Borderline wall thickening. 2.  10.3 cm mass in the right hepatic lobe.  Follow-up CT with infusion recommended for further  assessment, depending on renal function.  Alternatively, nonemergent MRI may be useful for further characterization. 3.  Renal cortical thinning compatible with medical renal disease. No stones or hydronephrosis.  Original Report Authenticated By: Andreas Newport, M.D.    Scheduled Meds:    . feeding supplement  237 mL Oral QPC supper  . heparin subcutaneous  5,000 Units Subcutaneous Q8H  . levothyroxine  100 mcg Oral Daily  . loratadine  10 mg Oral Daily  . piperacillin-tazobactam (ZOSYN)  IV  3.375 g Intravenous Q8H   Continuous Infusions:    . DISCONTD: sodium chloride 50 mL/hr at 03/30/12 1645   PRN Meds:.acetaminophen, acetaminophen, alum & mag hydroxide-simeth, docusate sodium, fentaNYL, midazolam, morphine injection, oxyCODONE

## 2012-03-31 NOTE — Evaluation (Signed)
Physical Therapy Evaluation Patient Details Name: Chelsey Savage MRN: [id number] DOB: [birth year] Today's Date: 03/31/2012 Time: 0820-0850 PT Time Calculation (min): 30 min  PT Assessment / Plan / Recommendation Clinical Impression  Pt. lives alone and was INdependent w/ all ADL's . Pt.'s O2 Sats dropped to 87% on RA to ambulate 60 ft. w/RW. RN notified. Replaced 2 l.  Pt. will benefit from PT to improve functional mobility, strength and safety to DC to home. Recommend that pt. have 24/7 supervision. Also recommend HHPT and recommend an OT consult in hospital.    PT Assessment  Patient needs continued PT services    Follow Up Recommendations  Home health PT;Supervision/Assistance - 24 hour    Barriers to Discharge Decreased caregiver support      Equipment Recommendations  Rolling walker with 5" wheels    Recommendations for Other Services OT consult   Frequency Min 3X/week    Precautions / Restrictions Precautions Precaution Comments: R sidedrain, dyspnea w/ decreased sats   Pertinent Vitals/Pain States site of drain is sore      Mobility  Bed Mobility Bed Mobility: Rolling Right;Right Sidelying to Sit Rolling Right: With rail;5: Supervision Right Sidelying to Sit: 4: Min assist;With rails;HOB flat Details for Bed Mobility Assistance: Pt. did have difficulty coming to upright. Transfers Transfers: Sit to Stand;Stand to Sit Sit to Stand: 4: Min assist;From bed;With upper extremity assist Stand to Sit: 4: Min guard;To chair/3-in-1;With upper extremity assist Details for Transfer Assistance: VC to push from bd and to reach to recliner. Ambulation/Gait Ambulation/Gait Assistance: 4: Min assist Assistive device: Rolling walker Ambulation/Gait Assistance Details: noted w/ dyspnea w/ activity Gait Pattern: Step-through pattern Gait velocity: decreased    Exercises     PT Diagnosis: Difficulty walking;Generalized weakness  PT Problem List: Decreased strength;Decreased  activity tolerance;Decreased mobility;Decreased knowledge of use of DME;Cardiopulmonary status limiting activity PT Treatment Interventions: DME instruction;Gait training;Stair training;Functional mobility training;Therapeutic activities;Patient/family education   PT Goals Acute Rehab PT Goals PT Goal Formulation: With patient Time For Goal Achievement: 04/14/12 Potential to Achieve Goals: Good Pt will go Supine/Side to Sit: Independently PT Goal: Supine/Side to Sit - Progress: Goal set today Pt will go Sit to Supine/Side: Independently PT Goal: Sit to Supine/Side - Progress: Goal set today Pt will go Sit to Stand: with modified independence PT Goal: Sit to Stand - Progress: Goal set today Pt will go Stand to Sit: with modified independence PT Goal: Stand to Sit - Progress: Goal set today Pt will Ambulate: 51 - 150 feet;with modified independence;with least restrictive assistive device PT Goal: Ambulate - Progress: Goal set today Pt will Go Up / Down Stairs: 1-2 stairs;with supervision;with rail(s)  Visit Information  Last PT Received On: 03/31/12 Assistance Needed: +2 (may  need O2)    Subjective Data  Subjective: I am tired. It will be good to get up   Prior Functioning  Home Living Lives With: Alone Available Help at Discharge: Family Type of Home: House Home Access: Stairs to enter Secretary/administrator of Steps: 2 Entrance Stairs-Rails: Right Home Layout: One level Firefighter: Standard Home Adaptive Equipment: Straight cane Additional Comments: Pt. states son is close by Prior Function Level of Independence: Independent Able to Take Stairs?: Yes Driving: Yes Vocation: Retired Musician: No difficulties    Cognition  Overall Cognitive Status: Appears within functional limits for tasks assessed/performed Arousal/Alertness: Awake/alert Orientation Level: Appears intact for tasks assessed Behavior During Session: [facility] for tasks performed      Extremity/Trunk Assessment Right  Upper Extremity Assessment RUE ROM/Strength/Tone: [facility] for tasks assessed Left Upper Extremity Assessment LUE ROM/Strength/Tone: [facility] for tasks assessed Right Lower Extremity Assessment RLE ROM/Strength/Tone: [facility] for tasks assessed Left Lower Extremity Assessment LLE ROM/Strength/Tone: WFL for tasks assessed   Balance    End of Session PT - End of Session Activity Tolerance: Patient limited by fatigue Patient left: in chair;with call bell/phone within reach Nurse Communication: Mobility status  GP     Rada Hay 03/31/2012, 9:21 AM  [phone]

## 2012-03-31 NOTE — Progress Notes (Signed)
Patient noted to have significant change in urine - dense, darkened (tea colored) and turbid, hint of red, with sediment, and malodorous.  Will continue to monitor.

## 2012-03-31 NOTE — Progress Notes (Signed)
I agree with the above documentation, including the assessment and plan. Aspirated fluid looks mostly consistent with infection. Cytology pending.  Cultures pending.  On broad-spectrum abx.  Drain in place into abscess cavity, which will likely need to be left in a week or more.  Given the cyst is loculated, surgical drainage may be required. We will check on the patient early in the week.  Call with questions over the weekend

## 2012-03-31 NOTE — Progress Notes (Signed)
  [facility]  Ultrasound guided liver cyst results noted. Reason for liver abscess not clear, she has no biliary strictures / obstruction. No pancreatic abnormalities. Would search for other reasons of hematogenous spread, ?echocardiogram. Will await final culture, continue Zosyn. Will follow up with her on Monday. [name], NP-C

## 2012-03-31 NOTE — Progress Notes (Signed)
Per MD order, central line team requested to place Midline (vs. PICC).  Received a call from [facility], [employer], questioning Midline vs. PICC.  This RN advised Renee that IV antibiotic for home administration would be either IV Invanz or Zosyn,per the MD, but that cultures were still pending.  Renee requested that this nurse page MD, and see if PICC could be placed instead of a midline, as this is usual policy when cultures are still pending (or else central line placement is held until cultures are resulted).    MD notified.  MD stated preference for Midline (vs. PICC), since patient's renal baseline is unknown, and if patient goes into renal failure "a PICC would be problematic."  This information relayed to [facility].  Renee stated that both PICCs and midlines use the same vein, therefore the risk to a renal patient is equivalent in any case.  She still advocated for a PICC vis Midline.  Renee and this RN agreed the easiest way to resolve the issue would be for her to page MD directly and discuss.  Will await resolution.

## 2012-03-31 NOTE — Progress Notes (Signed)
D: 71 yo [employer] pt Dr Thedore Mins with h/o htn, hypothyroid, and gallstones admitted through ED with UTI now HospDay#5 with new dx of liver mass.  Now PostProc#1 from liver biopsy with JP drain placement. Pt c/o worsening pain at drain site unrelieved by nonpharmacological interventions. A: Reassessed site.  Dressing remains c/d/i.  No changes in color, consistency, or amount of drainage. On call physician paged at 0113 to request narcotics for pain relief as pt is presently only ordered Tylenol. R: Received new orders for both po and iv medications.  Pt medicated with oxycodone 5mg  po at 0136.  Pt instructed regarding changes made in medications and about medications themselves.  No further questions from pt at this time.  Will cont to monitor pt status and medicate for pain prn. Marrion Coy, RN

## 2012-04-01 NOTE — Progress Notes (Signed)
Subjective: Pt feeling ok.  Denies pain at drain site.  Objective: Physical Exam: BP 144/74  Pulse 65  Temp 98.7 F (37.1 C) (Oral)  Resp 20  Ht 5\' 6"  (1.676 m)  Wt 241 lb 12.8 oz (109.68 kg)  BMI 39.03 kg/m2  SpO2 91% RUQ drain intact, site clean, NT Output yellow with purulent stranding. 45cc output recorded yesterday, and 25cc recorded today  Labs: CBC  Basename 03/31/12 0343 03/30/12 0333  WBC 12.3* 15.5*  HGB 9.1* 10.2*  HCT 25.9* 29.1*  PLT 473* 467*   BMET  Basename 03/31/12 0343 03/30/12 0333  NA 137 138  K 4.0 4.0  CL 101 101  CO2 28 24  GLUCOSE 112* 108*  BUN 18 21  CREATININE 1.22* 1.22*  CALCIUM 8.1* 8.4   LFT  Basename 03/31/12 0343  PROT 6.1  ALBUMIN 1.9*  AST 41*  ALT 38*  ALKPHOS 139*  BILITOT 2.7*  BILIDIR --  IBILI --  LIPASE --   PT/INR  Basename 03/30/12 1015  LABPROT 15.4*  INR 1.19     Studies/Results: Dg Chest 2 View  03/31/2012  *RADIOLOGY REPORT*  Clinical Data: Shortness of breath  CHEST - 2 VIEW  Comparison: 03/26/2012  Findings: Cardiomediastinal silhouette is stable.  Atherosclerotic calcifications of thoracic aorta again noted.  There is probable small right pleural effusion with right basilar atelectasis.  A drainage catheter is noted in the right upper abdomen.  No acute infiltrate or pulmonary edema. Stable degenerative changes thoracic spine.  IMPRESSION: There is probable small right pleural effusion with right basilar atelectasis.  A drainage catheter is noted in the right upper abdomen.  No acute infiltrate or pulmonary edema.  Original Report Authenticated By: Natasha Mead, M.D.   US Aspiration  03/31/2012  *RADIOLOGY REPORT*  Clinical history:71 year old with a liver lesion that is suspicious for an abscess or neoplasm.  PROCEDURE(S): ULTRASOUND GUIDED PLACEMENT OF A LIVER ABSCESS DRAIN; PLACEMENT OF A PERIPHERAL IV WITH ULTRASOUND GUIDANCE  Physician: Rachelle Hora. Henn, MD  Medications:Versed 2 mg, Fentanyl 100 mcg. A  radiology nurse monitored the patient for moderate sedation.  Moderate sedation time:20 minutes  Fluoroscopy time: None  Procedure:The procedure was explained to the patient.  The risks and benefits of the procedure were discussed and the patient's questions were addressed.  Informed consent was obtained from the patient.  The patient no longer had IV access.  Ultrasound was used to evaluate the left arm for IV access.  A patent antecubital vein was identified.  Tourniquet was placed.  The antecubital region was prepped and draped in a sterile fashion.  18 gauge angiocath was directed in the vein with ultrasound guidance.  The IV flushed well.  Attention was directed to the liver.  Ultrasound demonstrated a large multiloculated collection in the right hepatic lobe.  The right abdomen was prepped and draped in a sterile fashion.  Skin was anesthetized with 1% lidocaine.  The 17 gauge needle was directed into the collection and yellow purulent fluid was aspirated.  Stiff Amplatz wire was advanced into the collection with ultrasound guidance.  The tract was dilated to 10-French and a 10-French multipurpose drain was reconstituted within the collection.  Catheter position was confirmed with ultrasound.  200 ml of yellow purulent fluid was removed.  Catheter was attached to a suction bulb.  Catheter was sutured to the skin.  Findings:Large complex multiloculated fluid collection in the upper right hepatic lobe.  Fluid is suggestive for an abscess collection. 10-French drain  was placed within the collection.  200 ml fluid was obtained.  Samples sent for cytology and culture.  Complications: None  Impression:Placement of a drainage catheter within the liver accessed using ultrasound guidance.  Placement of a peripheral IV with ultrasound guidance.  Original Report Authenticated By: Richarda Overlie, M.D.   Ir Venipuncture 71yrs/older By Md  03/31/2012  *RADIOLOGY REPORT*  Clinical history:71 year old with a liver lesion that is  suspicious for an abscess or neoplasm.  PROCEDURE(S): ULTRASOUND GUIDED PLACEMENT OF A LIVER ABSCESS DRAIN; PLACEMENT OF A PERIPHERAL IV WITH ULTRASOUND GUIDANCE  Physician: Rachelle Hora. Henn, MD  Medications:Versed 2 mg, Fentanyl 100 mcg. A radiology nurse monitored the patient for moderate sedation.  Moderate sedation time:20 minutes  Fluoroscopy time: None  Procedure:The procedure was explained to the patient.  The risks and benefits of the procedure were discussed and the patient's questions were addressed.  Informed consent was obtained from the patient.  The patient no longer had IV access.  Ultrasound was used to evaluate the left arm for IV access.  A patent antecubital vein was identified.  Tourniquet was placed.  The antecubital region was prepped and draped in a sterile fashion.  18 gauge angiocath was directed in the vein with ultrasound guidance.  The IV flushed well.  Attention was directed to the liver.  Ultrasound demonstrated a large multiloculated collection in the right hepatic lobe.  The right abdomen was prepped and draped in a sterile fashion.  Skin was anesthetized with 1% lidocaine.  The 17 gauge needle was directed into the collection and yellow purulent fluid was aspirated.  Stiff Amplatz wire was advanced into the collection with ultrasound guidance.  The tract was dilated to 10-French and a 10-French multipurpose drain was reconstituted within the collection.  Catheter position was confirmed with ultrasound.  200 ml of yellow purulent fluid was removed.  Catheter was attached to a suction bulb.  Catheter was sutured to the skin.  Findings:Large complex multiloculated fluid collection in the upper right hepatic lobe.  Fluid is suggestive for an abscess collection. 10-French drain was placed within the collection.  200 ml fluid was obtained.  Samples sent for cytology and culture.  Complications: None  Impression:Placement of a drainage catheter within the liver accessed using ultrasound  guidance.  Placement of a peripheral IV with ultrasound guidance.  Original Report Authenticated By: Richarda Overlie, M.D.   Ir US Guide Vasc Access Left  03/31/2012  *RADIOLOGY REPORT*  Clinical history:71 year old with a liver lesion that is suspicious for an abscess or neoplasm.  PROCEDURE(S): ULTRASOUND GUIDED PLACEMENT OF A LIVER ABSCESS DRAIN; PLACEMENT OF A PERIPHERAL IV WITH ULTRASOUND GUIDANCE  Physician: Rachelle Hora. Henn, MD  Medications:Versed 2 mg, Fentanyl 100 mcg. A radiology nurse monitored the patient for moderate sedation.  Moderate sedation time:20 minutes  Fluoroscopy time: None  Procedure:The procedure was explained to the patient.  The risks and benefits of the procedure were discussed and the patient's questions were addressed.  Informed consent was obtained from the patient.  The patient no longer had IV access.  Ultrasound was used to evaluate the left arm for IV access.  A patent antecubital vein was identified.  Tourniquet was placed.  The antecubital region was prepped and draped in a sterile fashion.  18 gauge angiocath was directed in the vein with ultrasound guidance.  The IV flushed well.  Attention was directed to the liver.  Ultrasound demonstrated a large multiloculated collection in the right hepatic lobe.  The right abdomen  was prepped and draped in a sterile fashion.  Skin was anesthetized with 1% lidocaine.  The 17 gauge needle was directed into the collection and yellow purulent fluid was aspirated.  Stiff Amplatz wire was advanced into the collection with ultrasound guidance.  The tract was dilated to 10-French and a 10-French multipurpose drain was reconstituted within the collection.  Catheter position was confirmed with ultrasound.  200 ml of yellow purulent fluid was removed.  Catheter was attached to a suction bulb.  Catheter was sutured to the skin.  Findings:Large complex multiloculated fluid collection in the upper right hepatic lobe.  Fluid is suggestive for an abscess  collection. 10-French drain was placed within the collection.  200 ml fluid was obtained.  Samples sent for cytology and culture.  Complications: None  Impression:Placement of a drainage catheter within the liver accessed using ultrasound guidance.  Placement of a peripheral IV with ultrasound guidance.  Original Report Authenticated By: Richarda Overlie, M.D.    Assessment/Plan: Complex hepatic lesion/abscess s/p aspiration/drain placement 8/15 Continue to follow output and labs.    LOS: 6 days    Brayton El PA-C 04/01/2012 12:25 PM

## 2012-04-01 NOTE — Progress Notes (Signed)
[facility]                                                                                Patient Demographics  Chelsey Savage, is a 71 y.o. female  ZOX:[id number]  [id number]  DOB - [birth year]  Admit date - 03/26/2012  Admitting Physician Levie Heritage, DO  Outpatient Primary MD for the patient is No primary provider on file.  LOS - 6   Chief Complaint  Patient presents with  . Failure To Thrive  . Weakness  . Fatigue        Assessment & Plan    Brief narrative:  40 -year-old pleasant female with past medical history of hypertension, hypothyroidism who was admitted 03/26/2012 with working diagnosis of urinary tract infection secondary to possible dehydration. Patient was started on ceftriaxone however her white blood cell count continues to rise in addition to low grade fevers. Evaluation in emergency department included abdominal ultrasound with findings of 10.3 cm hepatic mass suspicious for malignant lesion. Gastroenterology consulted for an input on management.    Generalized weakness, anorexia, leukocytosis do to hepatic abscess found on MRI  Patient is status post ultrasound guided abscess drainage on 03/30/2012 by IR, she has a drain in place, she is on IV Zosyn, clinically much better appetite is already improved, generalized weakness is better, she is afebrile pain-free, will await cultures and will continue on IV Zosyn for now. Rest with Dr. Luciana Axe infectious disease who agrees with 10-14 days of IV antibiotics through PICC then close outpatient followup with GI / Surg on oral antibiotics. Discussed with Dr. Ezzard Standing general surgeon who has agreed to follow the patient in his office post discharge.  Will repeat CT contrast versus MRI in 1-2 days to look at abscess resolution after drain placement.  Cultures so far growing gram-negative rods will continue Zosyn and monitor final culture and sensitivity.    UTI (lower urinary tract infection)  with leukocytosis and low-grade fever Clinically stable question if fever and leukocytosis do to liver lesion, new urine cultures for better sampling, IV Zosyn for now to continue.     Acute kidney injury  Prerenal do to dehydration from decreased by mouth intake which is  almost resolved with IV fluids. Continue outpatient monitoring avoid nephrotoxic agents.    Hypertension  Will hold HCTZ , pressures are stable.    Hypothyroid  continue synthroid , order lower high TSH and slightly low free T3 with a stable free T4, and now we'll continue home dose Synthroid TSH free T3 and T4 can be repeated in 4-6 weeks as outpatient once her acute illness is much better.   Hypokalemia  Replace and stable    DVT prophylaxis  SCDs bilaterally along with heparin    Code Status: Full  Family Communication: Discussed with son bedside on 03/29/2012  Disposition Plan: Home    Procedures abdominal MRI , ultrasound-guided liver biopsy/aspiration by interventional radiology on 03/30/2012   Consults GI   Antibiotics  Rocephin stopped on 03/30/2012 and started on Zosyn   Time Spent in minutes   30   DVT Prophylaxis  Lovenox     Leroy Sea M.D on 04/01/2012 at  11:23 AM  Between 7am to 7pm - Pager - [phone]  After 7pm go to [email or web address] - password [email or web address]  And look for the night coverage person covering for me after hours  [facility] Office  [phone]    Subjective:   Chelsey Savage today has, No headache, No chest pain, No abdominal pain - No Nausea, No new weakness tingling or numbness, No Cough - SOB.   Objective:   Filed Vitals:   03/31/12 1329 03/31/12 1700 03/31/12 2214 04/01/12 0533  BP: 104/69  117/73 144/74  Pulse: 72  69 65  Temp: 98.2 F (36.8 C) 92 F (33.3 C) 99.2 F (37.3 C) 98.7 F (37.1 C)  TempSrc: Oral  Oral Oral  Resp: 18  20 20   Height:      Weight:      SpO2: 92% 92% 91% 91%    Wt Readings from Last 3  Encounters:  03/27/12 109.68 kg (241 lb 12.8 oz)     Intake/Output Summary (Last 24 hours) at 04/01/12 1123 Last data filed at 04/01/12 1059  Gross per 24 hour  Intake    255 ml  Output   1445 ml  Net  -1190 ml    Exam Awake Alert, Oriented X 3, No new F.N deficits, Normal affect [place].AT,PERRAL Supple Neck,No JVD, No cervical lymphadenopathy appriciated.  Symmetrical Chest wall movement, Good air movement bilaterally, CTAB RRR,No Gallops,Rubs or new Murmurs, No Parasternal Heave +ve B.Sounds, Abd Soft, Non tender, No organomegaly appriciated, No rebound - guarding or rigidity. No Cyanosis, Clubbing or edema, No new Rash or bruise      Data Review   CBC  Lab 03/31/12 0343 03/30/12 0333 03/29/12 0325 03/28/12 0345 03/27/12 0530 03/26/12 1817  WBC 12.3* 15.5* 17.4* 19.7* 18.4* --  HGB 9.1* 10.2* 9.6* 9.4* 10.7* --  HCT 25.9* 29.1* 27.5* 26.8* 29.4* --  PLT 473* 467* 408* 330 282 --  MCV 79.0 78.9 78.6 79.1 77.2* --  MCH 27.7 27.6 27.4 27.7 28.1 --  MCHC 35.1 35.1 34.9 35.1 36.4* --  RDW 13.3 13.3 13.3 13.3 13.2 --  LYMPHSABS -- -- -- -- -- 1.3  MONOABS -- -- -- -- -- 1.5*  EOSABS -- -- -- -- -- 0.0  BASOSABS -- -- -- -- -- 0.1  BANDABS -- -- -- -- -- --    Chemistries   Lab 03/31/12 0343 03/30/12 0333 03/29/12 0325 03/28/12 0345 03/27/12 0530 03/26/12 1817  NA 137 138 141 139 139 --  K 4.0 4.0 3.3* 4.5 3.2* --  CL 101 101 104 105 97 --  CO2 28 24 25 23 26  --  GLUCOSE 112* 108* 132* 107* 125* --  BUN 18 21 27* 35* 46* --  CREATININE 1.22* 1.22* 1.37* 1.49* 1.84* --  CALCIUM 8.1* 8.4 8.4 8.3* 9.0 --  MG -- -- -- -- -- --  AST 41* -- 71* -- -- 61*  ALT 38* -- 56* -- -- 58*  ALKPHOS 139* -- 165* -- -- 147*  BILITOT 2.7* -- 4.1* -- -- 3.8*   ------------------------------------------------------------------------------------------------------------------ estimated creatinine clearance is 53.1 ml/min (by C-G formula based on Cr of  1.22). ------------------------------------------------------------------------------------------------------------------ No results found for this basename: HGBA1C:2 in the last 72 hours ------------------------------------------------------------------------------------------------------------------ No results found for this basename: CHOL:2,HDL:2,LDLCALC:2,TRIG:2,CHOLHDL:2,LDLDIRECT:2 in the last 72 hours ------------------------------------------------------------------------------------------------------------------  [facility] 03/30/12 0333  TSH 6.261*  T4TOTAL --  T3FREE 1.2*  THYROIDAB --   ------------------------------------------------------------------------------------------------------------------ No results found for this basename: VITAMINB12:2,FOLATE:2,FERRITIN:2,TIBC:2,IRON:2,RETICCTPCT:2 in the last 72 hours  Coagulation profile  Lab 03/30/12 1015  INR 1.19  PROTIME --    No results found for this basename: DDIMER:2 in the last 72 hours  Cardiac Enzymes No results found for this basename: CK:3,CKMB:3,TROPONINI:3,MYOGLOBIN:3 in the last 168 hours ------------------------------------------------------------------------------------------------------------------ No components found with this basename: POCBNP:3  Micro Results Recent Results (from the past 240 hour(s))  URINE CULTURE     Status: Normal   Collection Time   03/26/12  7:41 PM      Component Value Range Status Comment   Specimen Description URINE, CLEAN CATCH   Final    Special Requests NONE   Final    Culture  Setup Time 03/27/2012 10:28   Final    Colony Count 90,000 COLONIES/ML   Final    Culture     Final    Value: Multiple bacterial morphotypes present, none predominant. Suggest appropriate recollection if clinically indicated.   Report Status 03/28/2012 FINAL   Final   URINE CULTURE     Status: Normal   Collection Time   03/30/12 11:24 AM      Component Value Range Status Comment   Specimen  Description URINE, CATHETERIZED   Final    Special Requests in and out cath   Final    Culture  Setup Time 03/30/2012 21:52   Final    Colony Count NO GROWTH   Final    Culture NO GROWTH   Final    Report Status 03/31/2012 FINAL   Final   CULTURE, ROUTINE-ABSCESS     Status: Normal (Preliminary result)   Collection Time   03/30/12  4:48 PM      Component Value Range Status Comment   Specimen Description ABSCESS LIVER   Final    Special Requests NONE   Final    Gram Stain     Final    Value: ABUNDANT WBC PRESENT,BOTH PMN AND MONONUCLEAR     NO SQUAMOUS EPITHELIAL CELLS SEEN     NO ORGANISMS SEEN   Culture FEW GRAM NEGATIVE RODS   Final    Report Status PENDING   Incomplete     Radiology Reports Dg Chest 2 View  03/26/2012  *RADIOLOGY REPORT*  Clinical Data: Failure to thrive  CHEST - 2 VIEW  Comparison: None.  Findings: Previous right mastectomy with vascular clips near the anterolateral chest wall.  Mild central pulmonary vascular congestion.  Mild cardiomegaly.  Tortuous atheromatous aorta.  No effusion.  Minimal lower thoracic spondylitic changes.  IMPRESSION:  Mild cardiomegaly and central pulmonary vascular congestion.  Original Report Authenticated By: Osa Craver, M.D.   US Abdomen Complete  03/26/2012  *RADIOLOGY REPORT*  Clinical Data:  Elevated liver function test.  Gallstones.  Nausea and fever.  COMPLETE ABDOMINAL ULTRASOUND  Comparison:  None.  Findings:  Gallbladder:  Cholelithiasis.  No sonographic Murphy's sign.  The largest gallstone measures 15 mm.  Gallbladder wall is borderline, ranging from 2 mm to 4 mm.  On the decubitus film, gallbladder is packed with gallstones with multiple echogenic foci.  Common bile duct:  Normal at 5 mm.  Portions of the common bile duct are obscured.  Liver:  There is a complex heterogeneous mass at the right posterior hepatic lobe measuring 10.3 cm x 10.2 cm x 9.2 cm.  This has internal vascular flow.  Although nonspecific, this is  suspicious for neoplasm or abscess.  CT abdomen and pelvis with contrast recommended for further assessment.  IVC:  Appears normal.  Pancreas:  Partially  obscured by overlying bowel gas.  Spleen:  5.8 cm. Normal echotexture.  Normal central sinus echo complex.  No calculi or hydronephrosis.  Right Kidney:  11.9 cm.  Renal cortical thinning compatible with medical renal disease.  Left Kidney:  11.9 cm.  Renal cortical thinning compatible with medical renal disease.  Abdominal aorta:  Suboptimal visualization.  26 mm in the upper abdomen.  IMPRESSION: 1.  Cholelithiasis without convincing evidence of acute cholecystitis.  No sonographic Murphy's sign.  Borderline wall thickening. 2.  10.3 cm mass in the right hepatic lobe.  Follow-up CT with infusion recommended for further assessment, depending on renal function.  Alternatively, nonemergent MRI may be useful for further characterization. 3.  Renal cortical thinning compatible with medical renal disease. No stones or hydronephrosis.  Original Report Authenticated By: Andreas Newport, M.D.    Scheduled Meds:    . feeding supplement  237 mL Oral QPC supper  . heparin subcutaneous  5,000 Units Subcutaneous Q8H  . levothyroxine  100 mcg Oral Daily  . loratadine  10 mg Oral Daily  . piperacillin-tazobactam (ZOSYN)  IV  3.375 g Intravenous Q8H   Continuous Infusions:   PRN Meds:.acetaminophen, acetaminophen, alum & mag hydroxide-simeth, docusate sodium, morphine injection, oxyCODONE, sodium chloride

## 2012-04-02 ENCOUNTER — Inpatient Hospital Stay (HOSPITAL_COMMUNITY): Payer: Medicare Other

## 2012-04-02 DIAGNOSIS — K750 Abscess of liver: Secondary | ICD-10-CM | POA: Diagnosis present

## 2012-04-02 LAB — BASIC METABOLIC PANEL
BUN: 15 mg/dL (ref 6–23)
CO2: 30 mEq/L (ref 19–32)
Calcium: 8.3 mg/dL — ABNORMAL LOW (ref 8.4–10.5)
Creatinine, Ser: 1.28 mg/dL — ABNORMAL HIGH (ref 0.50–1.10)
Glucose, Bld: 91 mg/dL (ref 70–99)

## 2012-04-02 LAB — CULTURE, ROUTINE-ABSCESS

## 2012-04-02 LAB — CBC
HCT: 25.4 % — ABNORMAL LOW (ref 36.0–46.0)
MCH: 27.6 pg (ref 26.0–34.0)
MCV: 78.9 fL (ref 78.0–100.0)
Platelets: 508 10*3/uL — ABNORMAL HIGH (ref 150–400)
RBC: 3.22 MIL/uL — ABNORMAL LOW (ref 3.87–5.11)

## 2012-04-02 MED ORDER — IOHEXOL 300 MG/ML  SOLN
80.0000 mL | Freq: Once | INTRAMUSCULAR | Status: AC | PRN
  Administered 2012-04-02: 80 mL via INTRAVENOUS

## 2012-04-02 MED ORDER — LEVOFLOXACIN IN D5W 750 MG/150ML IV SOLN
750.0000 mg | INTRAVENOUS | Status: DC
  Administered 2012-04-02: 750 mg via INTRAVENOUS
  Filled 2012-04-02 (×2): qty 150

## 2012-04-02 MED ORDER — ACETYLCYSTEINE 20 % IN SOLN: 600.0000 mg | Freq: Two times a day (BID) | RESPIRATORY_TRACT | Status: DC

## 2012-04-02 MED ORDER — SODIUM CHLORIDE 0.9 % IV SOLN: INTRAVENOUS | Status: AC

## 2012-04-02 NOTE — Progress Notes (Addendum)
[facility]                                                                                Patient Demographics  Chelsey Savage, is a 71 y.o. female  VWU:[id number]  [id number]:[id number]  DOB - [birth year]  Admit date - 03/26/2012  Admitting Physician Levie Heritage, DO  Outpatient Primary MD for the patient is No primary provider on file.  LOS - 7   Chief Complaint  Patient presents with  . Failure To Thrive  . Weakness  . Fatigue        Assessment & Plan    Brief narrative:  77 -year-old pleasant female with past medical history of hypertension, hypothyroidism who was admitted 03/26/2012 with working diagnosis of urinary tract infection secondary to possible dehydration. Patient was started on ceftriaxone however her white blood cell count continues to rise in addition to low grade fevers. Evaluation in emergency department included abdominal ultrasound with findings of 10.3 cm hepatic mass suspicious for malignant lesion. Gastroenterology consulted for an input on management.    Generalized weakness, anorexia, leukocytosis do to hepatic abscess found on MRI  Patient is status post ultrasound guided abscess drainage on 03/30/2012 by IR, she has a drain in place, she is on IV Zosyn, clinically much better appetite is already improved, generalized weakness is better, she is afebrile pain-free, will await cultures and will continue on IV Zosyn for now. Rest with Dr. Luciana Axe infectious disease who agrees with 10-14 days of IV antibiotics through PICC then close outpatient followup with GI / Surg on oral antibiotics. Discussed with Dr. Ezzard Standing general surgeon who has agreed to follow the patient in his office post discharge.  Followup MRI was ordered on 04/02/2012, was canceled by radiology, contacted by interventional radiologist Dr. Orest Dikes who recommended after looking at patient's labs and GFR that CT with IV contrast will be more prudent. At this time CT has been ordered I  will give patient few doses of oral Mucomyst and gentle IV fluids for prehydration.  Cultures noted , discussed with the lab over the phone on 04/02/2012, Citrobacter is sensitive to Levaquin, they will updated in the system, we'll switch her to IV Levaquin.     UTI (lower urinary tract infection) with leukocytosis and low-grade fever Clinically stable question if fever and leukocytosis due to liver lesion, clinically treated.     Acute kidney injury  Prerenal do to dehydration from decreased by mouth intake which is  almost resolved post IV fluids, patient likely at her baseline creatinine levels now. Continue outpatient monitoring avoid nephrotoxic agents.    Hypertension  Will hold HCTZ , pressures are stable.    Hypothyroid  continue synthroid , order lower high TSH and slightly low free T3 with a stable free T4, and now we'll continue home dose Synthroid TSH free T3 and T4 can be repeated in 4-6 weeks as outpatient once her acute illness is much better.   Hypokalemia  Replace and stable    DVT prophylaxis  SCDs bilaterally along with heparin    Code Status: Full  Family Communication: Discussed with son bedside on 03/29/2012  Disposition Plan: Home    Procedures  abdominal MRI x2 , ultrasound-guided liver biopsy/aspiration by interventional radiology on 03/30/2012   Consults GI   Antibiotics  Rocephin stopped on 03/30/2012 and started on Zosyn   Time Spent in minutes   30   DVT Prophylaxis  Lovenox     Leroy Sea M.D on 04/02/2012 at 11:09 AM  Between 7am to 7pm - Pager - [phone]  After 7pm go to [email or web address] - password [email or web address]  And look for the night coverage person covering for me after hours  [facility] Office  [phone]    Subjective:   Chelsey Savage today has, No headache, No chest pain, No abdominal pain - No Nausea, No new weakness tingling or numbness, No Cough - SOB.   Objective:   Filed Vitals:    04/01/12 0533 04/01/12 1454 04/01/12 2144 04/02/12 0536  BP: 144/74 154/71 147/76 136/69  Pulse: 65 73 63 58  Temp: 98.7 F (37.1 C) 98.4 F (36.9 C) 98.7 F (37.1 C) 97.6 F (36.4 C)  TempSrc: Oral Oral Oral Oral  Resp: 20 18 18 20   Height:      Weight:      SpO2: 91% 88% 98% 99%    Wt Readings from Last 3 Encounters:  03/27/12 109.68 kg (241 lb 12.8 oz)     Intake/Output Summary (Last 24 hours) at 04/02/12 1109 Last data filed at 04/02/12 2956  Gross per 24 hour  Intake    600 ml  Output    670 ml  Net    -70 ml    Exam Awake Alert, Oriented X 3, No new F.N deficits, Normal affect [place].AT,PERRAL Supple Neck,No JVD, No cervical lymphadenopathy appriciated.  Symmetrical Chest wall movement, Good air movement bilaterally, CTAB RRR,No Gallops,Rubs or new Murmurs, No Parasternal Heave +ve B.Sounds, Abd Soft, Non tender, No organomegaly appriciated, No rebound - guarding or rigidity. No Cyanosis, Clubbing or edema, No new Rash or bruise      Data Review   CBC  Lab 04/02/12 0654 03/31/12 0343 03/30/12 0333 03/29/12 0325 03/28/12 0345 03/26/12 1817  WBC 10.8* 12.3* 15.5* 17.4* 19.7* --  HGB 8.9* 9.1* 10.2* 9.6* 9.4* --  HCT 25.4* 25.9* 29.1* 27.5* 26.8* --  PLT 508* 473* 467* 408* 330 --  MCV 78.9 79.0 78.9 78.6 79.1 --  MCH 27.6 27.7 27.6 27.4 27.7 --  MCHC 35.0 35.1 35.1 34.9 35.1 --  RDW 13.6 13.3 13.3 13.3 13.3 --  LYMPHSABS -- -- -- -- -- 1.3  MONOABS -- -- -- -- -- 1.5*  EOSABS -- -- -- -- -- 0.0  BASOSABS -- -- -- -- -- 0.1  BANDABS -- -- -- -- -- --    Chemistries   Lab 04/02/12 0654 03/31/12 0343 03/30/12 0333 03/29/12 0325 03/28/12 0345 03/26/12 1817  NA 137 137 138 141 139 --  K 3.9 4.0 4.0 3.3* 4.5 --  CL 101 101 101 104 105 --  CO2 30 28 24 25 23  --  GLUCOSE 91 112* 108* 132* 107* --  BUN 15 18 21  27* 35* --  CREATININE 1.28* 1.22* 1.22* 1.37* 1.49* --  CALCIUM 8.3* 8.1* 8.4 8.4 8.3* --  MG -- -- -- -- -- --  AST -- 41* -- 71* -- 61*  ALT --  38* -- 56* -- 58*  ALKPHOS -- 139* -- 165* -- 147*  BILITOT -- 2.7* -- 4.1* -- 3.8*   ------------------------------------------------------------------------------------------------------------------ estimated creatinine clearance is 50.6 ml/min (by C-G formula based on Cr of 1.28). ------------------------------------------------------------------------------------------------------------------ No  results found for this basename: HGBA1C:2 in the last 72 hours ------------------------------------------------------------------------------------------------------------------ No results found for this basename: CHOL:2,HDL:2,LDLCALC:2,TRIG:2,CHOLHDL:2,LDLDIRECT:2 in the last 72 hours ------------------------------------------------------------------------------------------------------------------ No results found for this basename: TSH,T4TOTAL,FREET3,T3FREE,THYROIDAB in the last 72 hours ------------------------------------------------------------------------------------------------------------------ No results found for this basename: VITAMINB12:2,FOLATE:2,FERRITIN:2,TIBC:2,IRON:2,RETICCTPCT:2 in the last 72 hours  Coagulation profile  Lab 03/30/12 1015  INR 1.19  PROTIME --    No results found for this basename: DDIMER:2 in the last 72 hours  Cardiac Enzymes No results found for this basename: CK:3,CKMB:3,TROPONINI:3,MYOGLOBIN:3 in the last 168 hours ------------------------------------------------------------------------------------------------------------------ No components found with this basename: POCBNP:3  Micro Results Recent Results (from the past 240 hour(s))  URINE CULTURE     Status: Normal   Collection Time   03/26/12  7:41 PM      Component Value Range Status Comment   Specimen Description URINE, CLEAN CATCH   Final    Special Requests NONE   Final    Culture  Setup Time 03/27/2012 10:28   Final    Colony Count 90,000 COLONIES/ML   Final    Culture     Final     Value: Multiple bacterial morphotypes present, none predominant. Suggest appropriate recollection if clinically indicated.   Report Status 03/28/2012 FINAL   Final   URINE CULTURE     Status: Normal   Collection Time   03/30/12 11:24 AM      Component Value Range Status Comment   Specimen Description URINE, CATHETERIZED   Final    Special Requests in and out cath   Final    Culture  Setup Time 03/30/2012 21:52   Final    Colony Count NO GROWTH   Final    Culture NO GROWTH   Final    Report Status 03/31/2012 FINAL   Final   CULTURE, ROUTINE-ABSCESS     Status: Normal   Collection Time   03/30/12  4:48 PM      Component Value Range Status Comment   Specimen Description ABSCESS LIVER   Final    Special Requests NONE   Final    Gram Stain     Final    Value: ABUNDANT WBC PRESENT,BOTH PMN AND MONONUCLEAR     NO SQUAMOUS EPITHELIAL CELLS SEEN     NO ORGANISMS SEEN   Culture FEW CITROBACTER KOSERI   Final    Report Status 04/02/2012 FINAL   Final    Organism ID, Bacteria CITROBACTER KOSERI   Final     Radiology Reports Dg Chest 2 View  03/26/2012  *RADIOLOGY REPORT*  Clinical Data: Failure to thrive  CHEST - 2 VIEW  Comparison: None.  Findings: Previous right mastectomy with vascular clips near the anterolateral chest wall.  Mild central pulmonary vascular congestion.  Mild cardiomegaly.  Tortuous atheromatous aorta.  No effusion.  Minimal lower thoracic spondylitic changes.  IMPRESSION:  Mild cardiomegaly and central pulmonary vascular congestion.  Original Report Authenticated By: Osa Craver, M.D.   US Abdomen Complete  03/26/2012  *RADIOLOGY REPORT*  Clinical Data:  Elevated liver function test.  Gallstones.  Nausea and fever.  COMPLETE ABDOMINAL ULTRASOUND  Comparison:  None.  Findings:  Gallbladder:  Cholelithiasis.  No sonographic Murphy's sign.  The largest gallstone measures 15 mm.  Gallbladder wall is borderline, ranging from 2 mm to 4 mm.  On the decubitus film,  gallbladder is packed with gallstones with multiple echogenic foci.  Common bile duct:  Normal at 5 mm.  Portions of the common bile duct are obscured.  Liver:  There is a complex heterogeneous mass at the right posterior hepatic lobe measuring 10.3 cm x 10.2 cm x 9.2 cm.  This has internal vascular flow.  Although nonspecific, this is suspicious for neoplasm or abscess.  CT abdomen and pelvis with contrast recommended for further assessment.  IVC:  Appears normal.  Pancreas:  Partially obscured by overlying bowel gas.  Spleen:  5.8 cm. Normal echotexture.  Normal central sinus echo complex.  No calculi or hydronephrosis.  Right Kidney:  11.9 cm.  Renal cortical thinning compatible with medical renal disease.  Left Kidney:  11.9 cm.  Renal cortical thinning compatible with medical renal disease.  Abdominal aorta:  Suboptimal visualization.  26 mm in the upper abdomen.  IMPRESSION: 1.  Cholelithiasis without convincing evidence of acute cholecystitis.  No sonographic Murphy's sign.  Borderline wall thickening. 2.  10.3 cm mass in the right hepatic lobe.  Follow-up CT with infusion recommended for further assessment, depending on renal function.  Alternatively, nonemergent MRI may be useful for further characterization. 3.  Renal cortical thinning compatible with medical renal disease. No stones or hydronephrosis.  Original Report Authenticated By: Andreas Newport, M.D.    Scheduled Meds:    . feeding supplement  237 mL Oral QPC supper  . heparin subcutaneous  5,000 Units Subcutaneous Q8H  . levothyroxine  100 mcg Oral Daily  . loratadine  10 mg Oral Daily  . piperacillin-tazobactam (ZOSYN)  IV  3.375 g Intravenous Q8H   Continuous Infusions:   PRN Meds:.acetaminophen, acetaminophen, alum & mag hydroxide-simeth, docusate sodium, morphine injection, oxyCODONE, sodium chloride

## 2012-04-02 NOTE — Progress Notes (Signed)
ANTIBIOTIC CONSULT NOTE  Pharmacy Consult for: Changing Zosyn to Levaquin Indication: Liver abscess  No Known Allergies  Patient Measurements: Height: 5\' 6"  (167.6 cm) Weight: 241 lb 12.8 oz (109.68 kg) IBW/kg (Calculated) : 59.3   Vital Signs: Temp: 97.6 F (36.4 C) (08/18 0536) Temp src: Oral (08/18 0536) BP: 136/69 mmHg (08/18 0536) Pulse Rate: 58  (08/18 0536) Intake/Output from previous day: 08/17 0701 - 08/18 0700 In: 605 [P.O.:240; IV Piggyback:350] Out: 1045 [Urine:1000; Drains:45] Intake/Output from this shift:    Labs:  [facility] 04/02/12 0654 03/31/12 0343  WBC 10.8* 12.3*  HGB 8.9* 9.1*  PLT 508* 473*  LABCREA -- --  CREATININE 1.28* 1.22*   Estimated Creatinine Clearance: 50.6 ml/min (by C-G formula based on Cr of 1.28).   Microbiology: Recent Results (from the past 720 hour(s))  URINE CULTURE     Status: Normal   Collection Time   03/26/12  7:41 PM      Component Value Range Status Comment   Specimen Description URINE, CLEAN CATCH   Final    Special Requests NONE   Final    Culture  Setup Time 03/27/2012 10:28   Final    Colony Count 90,000 COLONIES/ML   Final    Culture     Final    Value: Multiple bacterial morphotypes present, none predominant. Suggest appropriate recollection if clinically indicated.   Report Status 03/28/2012 FINAL   Final   URINE CULTURE     Status: Normal   Collection Time   03/30/12 11:24 AM      Component Value Range Status Comment   Specimen Description URINE, CATHETERIZED   Final    Special Requests in and out cath   Final    Culture  Setup Time 03/30/2012 21:52   Final    Colony Count NO GROWTH   Final    Culture NO GROWTH   Final    Report Status 03/31/2012 FINAL   Final   CULTURE, ROUTINE-ABSCESS     Status: Normal   Collection Time   03/30/12  4:48 PM      Component Value Range Status Comment   Specimen Description ABSCESS LIVER   Final    Special Requests NONE   Final    Gram Stain     Final    Value:  ABUNDANT WBC PRESENT,BOTH PMN AND MONONUCLEAR     NO SQUAMOUS EPITHELIAL CELLS SEEN     NO ORGANISMS SEEN   Culture FEW CITROBACTER KOSERI   Final    Report Status 04/02/2012 FINAL   Final    Organism ID, Bacteria CITROBACTER KOSERI   Final     Medications:  Anti-infectives     Start     Dose/Rate Route Frequency Ordered Stop   03/30/12 1015   piperacillin-tazobactam (ZOSYN) IVPB 3.375 g  Status:  Discontinued        3.375 g 12.5 mL/hr over 240 Minutes Intravenous Every 8 hours 03/30/12 1006 04/02/12 1116   03/28/12 0000   ciprofloxacin (CIPRO) 500 MG tablet        500 mg Oral 2 times daily 03/28/12 0851 04/07/12 2359   03/27/12 2300   cefTRIAXone (ROCEPHIN) 1 g in dextrose 5 % 50 mL IVPB  Status:  Discontinued        1 g 100 mL/hr over 30 Minutes Intravenous Every 24 hours 03/27/12 0153 03/30/12 0930   03/26/12 2145   cefTRIAXone (ROCEPHIN) 1 g in dextrose 5 % 50 mL IVPB  1 g 100 mL/hr over 30 Minutes Intravenous  Once 03/26/12 2131 03/26/12 2323         Assessment:  Chelsey Savage admitted 8/11 with weakness, failure to thrive  Abdominal U/S with hepatic mass vs abscess, s/p drainage 8/15. GI following,   Treated with 4 days Rocephin for UTI, then broadened to Zosyn on 8/15 for fever, increasing WBC  WBC improving, currently afebrile  Changing Zosyn to Levaquin today, abscess cultures growing few Citrobacter sensitive to all agents tested including quinolones  Plan noted for 10-14 days antibiotics per ID  Goal of Therapy:  Antibiotic dose/schedule based on renal function, cultures.  Plan:   Levaquin 750mg  IV q24h  Monitor renal function closely, will need to decrease interval to q48h if Scr rises any further   Loralee Pacas, PharmD, BCPS Pager: [phone] 04/02/2012,11:35 AM

## 2012-04-02 NOTE — Progress Notes (Signed)
Pharmacy: Mucomyst Discontinued  Per Protocol:  Acetylcysteine (Mucomyst) inhalation solution remains in short supply.  At this time prescribing is limited to respiratory and acetaminophen overdose treatment only.  Acetylcysteine cannot be dispensed for prophylaxis of contrast induced nephropathy.    Thanks,  Cami Delawder, Loma Messing PharmD 12:09 PM 04/02/2012

## 2012-04-03 DIAGNOSIS — K750 Abscess of liver: Principal | ICD-10-CM

## 2012-04-03 LAB — BASIC METABOLIC PANEL
BUN: 10 mg/dL (ref 6–23)
CO2: 27 mEq/L (ref 19–32)
Chloride: 102 mEq/L (ref 96–112)
Glucose, Bld: 92 mg/dL (ref 70–99)
Potassium: 3.9 mEq/L (ref 3.5–5.1)

## 2012-04-03 MED ORDER — METRONIDAZOLE 500 MG PO TABS
500.0000 mg | ORAL_TABLET | Freq: Three times a day (TID) | ORAL | Status: DC
  Administered 2012-04-03: 500 mg via ORAL
  Filled 2012-04-03 (×3): qty 1

## 2012-04-03 MED ORDER — SACCHAROMYCES BOULARDII 250 MG PO CAPS: 250.0000 mg | ORAL_CAPSULE | Freq: Two times a day (BID) | ORAL | Status: AC

## 2012-04-03 MED ORDER — HYDROCODONE-ACETAMINOPHEN 5-500 MG PO TABS: 1.0000 | ORAL_TABLET | Freq: Three times a day (TID) | ORAL | Status: AC | PRN

## 2012-04-03 MED ORDER — METRONIDAZOLE 500 MG PO TABS: 500.0000 mg | ORAL_TABLET | Freq: Three times a day (TID) | ORAL | Status: DC

## 2012-04-03 MED ORDER — LEVOFLOXACIN 750 MG PO TABS: 750.0000 mg | ORAL_TABLET | Freq: Every day | ORAL | Status: AC

## 2012-04-03 NOTE — Progress Notes (Signed)
OT Note: Pt has discharge order written.  Spoke to pt--she doesn't believe she has OT needs.  She has son at home with her and feels she will be able to access regular commode.  She has a female relative assist with showering (has seat inside of tub).  HHPT was recommended.  [place], OTR/L [phone] 04/03/2012

## 2012-04-03 NOTE — Plan of Care (Signed)
Problem: Discharge Progression Outcomes Goal: Discharge plan in place and appropriate Outcome: Adequate for Discharge Discharged home to son,

## 2012-04-03 NOTE — Progress Notes (Signed)
I have taken an interval history, reviewed the chart and examined the patient. I agree with the extender's note, impression and recommendations. Continue antibiotics. OK for discharge from Gi standpoint. OP follow up with Gen Surgery, Dr. Ovidio Kin, and if needed with her gastroenterologist, Dr. Ritta Slot. We will sign off.  Venita Lick. Russella Dar MD [name]

## 2012-04-03 NOTE — Progress Notes (Signed)
Chelsey Savage  SUBJECTIVE: feels okay, no abdominal pain. Appetite okay  OBJECTIVE:  Vital signs in last 24 hours: Temp:  [98.2 F (36.8 C)-98.9 F (37.2 C)] 98.3 F (36.8 C) (08/19 0551) Pulse Rate:  [56-66] 66  (08/19 0551) Resp:  [18-20] 18  (08/19 0551) BP: (142-149)/(58-84) 144/59 mmHg (08/19 0551) SpO2:  [93 %-99 %] 93 % (08/19 0551) Last BM Date: 04/02/12 General:    Pleasant black female in NAD Heart:  Regular rate and rhythm Lungs: Respirations even and unlabored Abdomen:  Soft, nontender and nondistended. Normal bowel sounds. Percutaneous drain in place, small amount of yellow output in bulb Neurologic:  Alert and oriented,  grossly normal neurologically. Psych:  Cooperative. Normal mood and affect.  Lab Results:  [facility] 04/02/12 0654  WBC 10.8*  HGB 8.9*  HCT 25.4*  PLT 508*   BMET  Basename 04/03/12 0751 04/02/12 0654  NA 137 137  K 3.9 3.9  CL 102 101  CO2 27 30  GLUCOSE 92 91  BUN 10 15  CREATININE 1.16* 1.28*  CALCIUM 8.6 8.3*    Studies/Results: Ct Abdomen W Contrast  04/02/2012  *RADIOLOGY REPORT*  Clinical Data: Liver abscess.  Recent drain placement.  CT ABDOMEN WITH CONTRAST  Technique:  Multidetector CT imaging of the abdomen was performed following the standard protocol during bolus administration of intravenous contrast.  Contrast: 80mL OMNIPAQUE IOHEXOL 300 MG/ML  SOLN  Comparison: MR abdomen 03/29/2012.  Findings: Lung bases show a small right pleural effusion with compressive atelectasis in the right lower lobe.  Minimal dependent atelectasis in the left lower lobe. Heart size within normal limits.  No pericardial effusion.  A complex low attenuation lesion in the dome of the liver measures 7.7 x 10.3 cm (previously 8.3 x 11.5 cm on 03/29/2012).  A percutaneous pigtail catheter is seen within the inferior portion of the lesion. Liver is otherwise unremarkable.  Numerous stones are seen in the gallbladder.  Adrenal  glands and right kidney are unremarkable.  Sub centimeter low attenuation lesion in the upper pole left kidney is too small to characterize.  Spleen, pancreas, stomach and visualized bowel are unremarkable.  Atherosclerotic calcification of the arterial vasculature without abdominal aortic aneurysm.  Abdominal lymph nodes measure up to 11 mm in short axis in the porta hepatis.  No worrisome lytic or sclerotic lesions.  IMPRESSION:  1.  Slight interval decrease in size of a complex right hepatic lobe lesion, presumed to represent an abscess, with percutaneous drain in place. 2.  Small right pleural effusion with compressive atelectasis in the right lower lobe. 3.  Cholelithiasis. 4.  Borderline enlarged porta hepatis lymph node, likely reactive.  Original Report Authenticated By: Reyes Ivan, M.D.     ASSESSMENT / PLAN:  1. Citrobacter Koseri liver abscess, s/p percutaneous drain placement. Follow up CTscan on 8/18 shows decreased size of abscess. Patinet was was switched to  IV Levaquin based on abscess fluid C&S.  I spoke with surgery PA Tresa Endo) and surgery reviewed CTscan and sees no need for surgical intervention at this time.  2.  History of colon polyps, no details. Patient followed by Dr. Kinnie Scales. Sounds like she is up to date on surveillance exams.      LOS: 8 days   Chelsey Savage  04/03/2012, 8:27 AM

## 2012-04-03 NOTE — Discharge Summary (Signed)
[facility]                                                                                   Chelsey Savage, is a 71 y.o. female  DOB 1941/01/07  MRN [id number].  Admission date:  03/26/2012  Discharge Date:  04/03/2012  Primary MD  No primary provider on file.  Admitting Physician  Levie Heritage, DO  Admission Diagnosis  Hypokalemia [276.8] Urinary tract infection [599.0] Gallstones [574.20] Renal insufficiency [593.9] Elevated liver function tests [790.6] Liver mass [573.9] Fever [780.60] LACK OF APPETITE  Discharge Diagnosis     Principal Problem:  *Liver abscess Active Problems:  Hypertension  Hypothyroid  UTI (lower urinary tract infection)  Hypokalemia  Cholelithiasis  Acute kidney injury  Leukocytosis    Past Medical History  Diagnosis Date  . Hypothyroid   . Hypertension   . Breast cancer   . Allergy     Past Surgical History  Procedure Date  . Mastectomy          Discharge Diagnoses:   Principal Problem:  *Liver abscess Active Problems:  Hypertension  Hypothyroid  UTI (lower urinary tract infection)  Hypokalemia  Cholelithiasis  Acute kidney injury  Leukocytosis    Discharge Condition: Stable   Diet recommendation: See Discharge Instructions below   Consults  - ID Dr Luciana Axe x 3 over the phone today and today to discuss patient's case antibiotic and followup.                     GI, IR                      Neurosurgery Dr. Ezzard Standing and Dr. Georgana Curio who both suggested that they had nothing to offer an inpatient setting and that patient should follow outpatient with general surgery.      History of present illness and  Hospital Course:  See H&P, Labs, Consult and Test reports for all details in brief, patient was admitted for generalized weakness fatigue and anorexia which was all caused due to liver abscess, patient was initially thought to have UTI causing these symptoms however after further workup it became  clear that she had a large liver abscess cultures of which are growing Citrobacter which is Levaquin sensitive. On imaging patient had abscess which was about 11 cm in size and multiloculated, he was seen by GI, IR and had a drain placed in the abscess site on 03/30/2012 by IR. Followup CT showed the abscess had on down but only marginally, case was discussed with general surgery x2 including Dr. Ezzard Standing and jerkin who both suggested that they had nothing to offer an inpatient setting and that patient should follow in there office. Have also discussed her case multiple times with Dr. Rosetta Posner infectious disease who agrees with Levaquin but has suggested 2 and Flagyl as liver abscesses could be polymicrobial.  Patient will need close followup with ID, general surgery,GI and primary care . Her abscess has to be closely monitored by imaging and clinically, antibiotic course will depend on the future cyst size and its resolution. Now I have given her 4 weeks of  more antibiotics as suggested by ID.    Patient had acute renal failure upon admission this was preteen or due to decreased by mouth intake, it has completely resolved with hydration, continuing to hold her ACE inhibitor and HCTZ as her blood pressures are stable, request primary care physician to resume blood pressure medications as needed once creatinine remains stable for the next few days.   Patient has hypothyroidism and her TSH was was slightly high and free T3 was slightly low however in the light of her acute illness we'll not adjust her Synthroid dose and will have her primary care physician recheck TSH free T3 and free T4 in 4-6 weeks.      Today   Subjective:   Chelsey Savage today has no headache,no chest abdominal pain,no new weakness tingling or numbness, feels much better wants to go home today.    Objective:   Blood pressure 144/59, pulse 66, temperature 98.3 F (36.8 C), temperature source Oral, resp. rate 18, height 5\' 6"   (1.676 m), weight 109.68 kg (241 lb 12.8 oz), SpO2 93.00%.   Intake/Output Summary (Last 24 hours) at 04/03/12 1137 Last data filed at 04/03/12 0651  Gross per 24 hour  Intake    807 ml  Output   1300 ml  Net   -493 ml    Exam Awake Alert, Oriented *3, No new F.N deficits, Normal affect Branch.AT,PERRAL Supple Neck,No JVD, No cervical lymphadenopathy appriciated.  Symmetrical Chest wall movement, Good air movement bilaterally, CTAB RRR,No Gallops,Rubs or new Murmurs, No Parasternal Heave +ve B.Sounds, Abd Soft, Non tender, No organomegaly appriciated, No rebound -guarding or rigidity. Liver drain in place site is stable. No Cyanosis, Clubbing or edema, No new Rash or bruise  Data Review     Dg Chest 2 View  03/31/2012  *RADIOLOGY REPORT*  Clinical Data: Shortness of breath  CHEST - 2 VIEW  Comparison: 03/26/2012  Findings: Cardiomediastinal silhouette is stable.  Atherosclerotic calcifications of thoracic aorta again noted.  There is probable small right pleural effusion with right basilar atelectasis.  A drainage catheter is noted in the right upper abdomen.  No acute infiltrate or pulmonary edema. Stable degenerative changes thoracic spine.  IMPRESSION: There is probable small right pleural effusion with right basilar atelectasis.  A drainage catheter is noted in the right upper abdomen.  No acute infiltrate or pulmonary edema.  Original Report Authenticated By: Natasha Mead, M.D.   Dg Chest 2 View  03/26/2012  *RADIOLOGY REPORT*  Clinical Data: Failure to thrive  CHEST - 2 VIEW  Comparison: None.  Findings: Previous right mastectomy with vascular clips near the anterolateral chest wall.  Mild central pulmonary vascular congestion.  Mild cardiomegaly.  Tortuous atheromatous aorta.  No effusion.  Minimal lower thoracic spondylitic changes.  IMPRESSION:  Mild cardiomegaly and central pulmonary vascular congestion.  Original Report Authenticated By: Osa Craver, M.D.   Ct Abdomen W  Contrast  04/02/2012  *RADIOLOGY REPORT*  Clinical Data: Liver abscess.  Recent drain placement.  CT ABDOMEN WITH CONTRAST  Technique:  Multidetector CT imaging of the abdomen was performed following the standard protocol during bolus administration of intravenous contrast.  Contrast: 80mL OMNIPAQUE IOHEXOL 300 MG/ML  SOLN  Comparison: MR abdomen 03/29/2012.  Findings: Lung bases show a small right pleural effusion with compressive atelectasis in the right lower lobe.  Minimal dependent atelectasis in the left lower lobe. Heart size within normal limits.  No pericardial effusion.  A complex low attenuation lesion in the dome  of the liver measures 7.7 x 10.3 cm (previously 8.3 x 11.5 cm on 03/29/2012).  A percutaneous pigtail catheter is seen within the inferior portion of the lesion. Liver is otherwise unremarkable.  Numerous stones are seen in the gallbladder.  Adrenal glands and right kidney are unremarkable.  Sub centimeter low attenuation lesion in the upper pole left kidney is too small to characterize.  Spleen, pancreas, stomach and visualized bowel are unremarkable.  Atherosclerotic calcification of the arterial vasculature without abdominal aortic aneurysm.  Abdominal lymph nodes measure up to 11 mm in short axis in the porta hepatis.  No worrisome lytic or sclerotic lesions.  IMPRESSION:  1.  Slight interval decrease in size of a complex right hepatic lobe lesion, presumed to represent an abscess, with percutaneous drain in place. 2.  Small right pleural effusion with compressive atelectasis in the right lower lobe. 3.  Cholelithiasis. 4.  Borderline enlarged porta hepatis lymph node, likely reactive.  Original Report Authenticated By: Reyes Ivan, M.D.   Mr Abdomen W Wo Contrast  03/30/2012  **ADDENDUM** CREATED: 03/30/2012 09:10:04  The following was omitted from the findings section of the original report:  Additional 1.2 cm short axis enhancing nodule adjacent to the gallbladder neck (series  1303/image 64), possibly reflecting a small lymph node.  The impression remains unchanged.  **END ADDENDUM** SIGNED BY: [name], M.D.   03/30/2012  *RADIOLOGY REPORT*  Clinical Data: Follow-up liver lesion on ultrasound, history of breast cancer  MRI ABDOMEN WITH AND WITHOUT CONTRAST  Technique:  Multiplanar multisequence MR imaging of the abdomen was performed both before and after administration of intravenous contrast.  Contrast: 10mL MULTIHANCE GADOBENATE DIMEGLUMINE 529 MG/ML IV SOLN  Comparison: Ultrasound abdomen dated 03/26/2012  Findings: Motion degraded images.  11.5 x 8.3 x 9.2 cm complex cystic lesion with numerous enhancing septations in the right hepatic lobe (series 3/image 14), corresponding to the sonographic abnormality.  In the appropriate clinical setting, the appearance would be compatible with hepatic abscess, although there is no definite perilesional edema to support this.  Alternatively, while this appearance would be unusual for metastasis, a primary biliary cystadenocarcinoma could have this appearance (statistically much less common).  Mild hepatic steatosis.  Spleen, pancreas, and right adrenal gland are within normal limits. Mild nodularity of the left adrenal gland with signal loss on opposed phase imaging, likely reflecting adenomatous hyperplasia.  Cholelithiasis, without associated inflammatory changes.  No intrahepatic or extrahepatic ductal dilatation.  Subcentimeter left upper pole renal cyst (series 11/image 13). Right kidney is within normal limits.  No hydronephrosis.  No abdominal ascites.  Borderline enlarged upper abdominal nodes, including a 12 mm short- axis node along the falciform ligament (series 3/image 27) and a 9 mm short-axis aortocaval node (series 3/image 29).  No focal osseous lesions.  IMPRESSION: Motion degraded images.  11.5 cm complex cystic lesion with enhancing septations in the right hepatic lobe, possibly reflecting a hepatic abscess or  primary neoplasm such as biliary cystadenocarcinoma (statistically less likely).  The appearance is considered unusual for metastasis.  Borderline upper abdominal lymphadenopathy, as described above.  Cholelithiasis, without associated inflammatory changes.  Mild hepatic steatosis. Original Report Authenticated By: [name], M.D.   US Abdomen Complete  03/26/2012  *RADIOLOGY REPORT*  Clinical Data:  Elevated liver function test.  Gallstones.  Nausea and fever.  COMPLETE ABDOMINAL ULTRASOUND  Comparison:  None.  Findings:  Gallbladder:  Cholelithiasis.  No sonographic Murphy's sign.  The largest gallstone measures 15 mm.  Gallbladder wall  is borderline, ranging from 2 mm to 4 mm.  On the decubitus film, gallbladder is packed with gallstones with multiple echogenic foci.  Common bile duct:  Normal at 5 mm.  Portions of the common bile duct are obscured.  Liver:  There is a complex heterogeneous mass at the right posterior hepatic lobe measuring 10.3 cm x 10.2 cm x 9.2 cm.  This has internal vascular flow.  Although nonspecific, this is suspicious for neoplasm or abscess.  CT abdomen and pelvis with contrast recommended for further assessment.  IVC:  Appears normal.  Pancreas:  Partially obscured by overlying bowel gas.  Spleen:  5.8 cm. Normal echotexture.  Normal central sinus echo complex.  No calculi or hydronephrosis.  Right Kidney:  11.9 cm.  Renal cortical thinning compatible with medical renal disease.  Left Kidney:  11.9 cm.  Renal cortical thinning compatible with medical renal disease.  Abdominal aorta:  Suboptimal visualization.  26 mm in the upper abdomen.  IMPRESSION: 1.  Cholelithiasis without convincing evidence of acute cholecystitis.  No sonographic Murphy's sign.  Borderline wall thickening. 2.  10.3 cm mass in the right hepatic lobe.  Follow-up CT with infusion recommended for further assessment, depending on renal function.  Alternatively, nonemergent MRI may be useful for further  characterization. 3.  Renal cortical thinning compatible with medical renal disease. No stones or hydronephrosis.  Original Report Authenticated By: Andreas Newport, M.D.   US Renal  03/29/2012  *RADIOLOGY REPORT*  Clinical Data: 71 year old female with acute renal failure.  RENAL/URINARY TRACT ULTRASOUND COMPLETE  Comparison:  Abdomen ultrasound 03/26/2012 and earlier.  Findings:  Right Kidney:  No hydronephrosis.  Renal length stable, currently measured at 10.1 cm.  Renal cortical thinning but preserved cortical echotexture.  Left Kidney:  No hydronephrosis.  Stable renal length measured at 11.1 cm.  Better preserved cortical thickness and cortical echotexture within normal limits.  Bladder:  Unremarkable.  IMPRESSION: No acute renal findings.  Original Report Authenticated By: [name], M.D.   US Aspiration  03/31/2012  *RADIOLOGY REPORT*  Clinical history:71 year old with a liver lesion that is suspicious for an abscess or neoplasm.  PROCEDURE(S): ULTRASOUND GUIDED PLACEMENT OF A LIVER ABSCESS DRAIN; PLACEMENT OF A PERIPHERAL IV WITH ULTRASOUND GUIDANCE  Physician: Rachelle Hora. Henn, MD  Medications:Versed 2 mg, Fentanyl 100 mcg. A radiology nurse monitored the patient for moderate sedation.  Moderate sedation time:20 minutes  Fluoroscopy time: None  Procedure:The procedure was explained to the patient.  The risks and benefits of the procedure were discussed and the patient's questions were addressed.  Informed consent was obtained from the patient.  The patient no longer had IV access.  Ultrasound was used to evaluate the left arm for IV access.  A patent antecubital vein was identified.  Tourniquet was placed.  The antecubital region was prepped and draped in a sterile fashion.  18 gauge angiocath was directed in the vein with ultrasound guidance.  The IV flushed well.  Attention was directed to the liver.  Ultrasound demonstrated a large multiloculated collection in the right hepatic lobe.  The right  abdomen was prepped and draped in a sterile fashion.  Skin was anesthetized with 1% lidocaine.  The 17 gauge needle was directed into the collection and yellow purulent fluid was aspirated.  Stiff Amplatz wire was advanced into the collection with ultrasound guidance.  The tract was dilated to 10-French and a 10-French multipurpose drain was reconstituted within the collection.  Catheter position was confirmed with ultrasound.  200 ml of yellow purulent fluid was removed.  Catheter was attached to a suction bulb.  Catheter was sutured to the skin.  Findings:Large complex multiloculated fluid collection in the upper right hepatic lobe.  Fluid is suggestive for an abscess collection. 10-French drain was placed within the collection.  200 ml fluid was obtained.  Samples sent for cytology and culture.  Complications: None  Impression:Placement of a drainage catheter within the liver accessed using ultrasound guidance.  Placement of a peripheral IV with ultrasound guidance.  Original Report Authenticated By: Richarda Overlie, M.D.   Ir Venipuncture 35yrs/older By Md  03/31/2012  *RADIOLOGY REPORT*  Clinical history:71 year old with a liver lesion that is suspicious for an abscess or neoplasm.  PROCEDURE(S): ULTRASOUND GUIDED PLACEMENT OF A LIVER ABSCESS DRAIN; PLACEMENT OF A PERIPHERAL IV WITH ULTRASOUND GUIDANCE  Physician: Rachelle Hora. Henn, MD  Medications:Versed 2 mg, Fentanyl 100 mcg. A radiology nurse monitored the patient for moderate sedation.  Moderate sedation time:20 minutes  Fluoroscopy time: None  Procedure:The procedure was explained to the patient.  The risks and benefits of the procedure were discussed and the patient's questions were addressed.  Informed consent was obtained from the patient.  The patient no longer had IV access.  Ultrasound was used to evaluate the left arm for IV access.  A patent antecubital vein was identified.  Tourniquet was placed.  The antecubital region was prepped and draped in a sterile  fashion.  18 gauge angiocath was directed in the vein with ultrasound guidance.  The IV flushed well.  Attention was directed to the liver.  Ultrasound demonstrated a large multiloculated collection in the right hepatic lobe.  The right abdomen was prepped and draped in a sterile fashion.  Skin was anesthetized with 1% lidocaine.  The 17 gauge needle was directed into the collection and yellow purulent fluid was aspirated.  Stiff Amplatz wire was advanced into the collection with ultrasound guidance.  The tract was dilated to 10-French and a 10-French multipurpose drain was reconstituted within the collection.  Catheter position was confirmed with ultrasound.  200 ml of yellow purulent fluid was removed.  Catheter was attached to a suction bulb.  Catheter was sutured to the skin.  Findings:Large complex multiloculated fluid collection in the upper right hepatic lobe.  Fluid is suggestive for an abscess collection. 10-French drain was placed within the collection.  200 ml fluid was obtained.  Samples sent for cytology and culture.  Complications: None  Impression:Placement of a drainage catheter within the liver accessed using ultrasound guidance.  Placement of a peripheral IV with ultrasound guidance.  Original Report Authenticated By: Richarda Overlie, M.D.   Ir US Guide Vasc Access Left  03/31/2012  *RADIOLOGY REPORT*  Clinical history:71 year old with a liver lesion that is suspicious for an abscess or neoplasm.  PROCEDURE(S): ULTRASOUND GUIDED PLACEMENT OF A LIVER ABSCESS DRAIN; PLACEMENT OF A PERIPHERAL IV WITH ULTRASOUND GUIDANCE  Physician: Rachelle Hora. Henn, MD  Medications:Versed 2 mg, Fentanyl 100 mcg. A radiology nurse monitored the patient for moderate sedation.  Moderate sedation time:20 minutes  Fluoroscopy time: None  Procedure:The procedure was explained to the patient.  The risks and benefits of the procedure were discussed and the patient's questions were addressed.  Informed consent was obtained from the  patient.  The patient no longer had IV access.  Ultrasound was used to evaluate the left arm for IV access.  A patent antecubital vein was identified.  Tourniquet was placed.  The antecubital region was prepped and  draped in a sterile fashion.  18 gauge angiocath was directed in the vein with ultrasound guidance.  The IV flushed well.  Attention was directed to the liver.  Ultrasound demonstrated a large multiloculated collection in the right hepatic lobe.  The right abdomen was prepped and draped in a sterile fashion.  Skin was anesthetized with 1% lidocaine.  The 17 gauge needle was directed into the collection and yellow purulent fluid was aspirated.  Stiff Amplatz wire was advanced into the collection with ultrasound guidance.  The tract was dilated to 10-French and a 10-French multipurpose drain was reconstituted within the collection.  Catheter position was confirmed with ultrasound.  200 ml of yellow purulent fluid was removed.  Catheter was attached to a suction bulb.  Catheter was sutured to the skin.  Findings:Large complex multiloculated fluid collection in the upper right hepatic lobe.  Fluid is suggestive for an abscess collection. 10-French drain was placed within the collection.  200 ml fluid was obtained.  Samples sent for cytology and culture.  Complications: None  Impression:Placement of a drainage catheter within the liver accessed using ultrasound guidance.  Placement of a peripheral IV with ultrasound guidance.  Original Report Authenticated By: Richarda Overlie, M.D.       Recent Results (from the past 240 hour(s))  URINE CULTURE     Status: Normal   Collection Time   03/26/12  7:41 PM      Component Value Range Status Comment   Specimen Description URINE, CLEAN CATCH   Final    Special Requests NONE   Final    Culture  Setup Time 03/27/2012 10:28   Final    Colony Count 90,000 COLONIES/ML   Final    Culture     Final    Value: Multiple bacterial morphotypes present, none predominant.  Suggest appropriate recollection if clinically indicated.   Report Status 03/28/2012 FINAL   Final   URINE CULTURE     Status: Normal   Collection Time   03/30/12 11:24 AM      Component Value Range Status Comment   Specimen Description URINE, CATHETERIZED   Final    Special Requests in and out cath   Final    Culture  Setup Time 03/30/2012 21:52   Final    Colony Count NO GROWTH   Final    Culture NO GROWTH   Final    Report Status 03/31/2012 FINAL   Final   CULTURE, ROUTINE-ABSCESS     Status: Normal   Collection Time   03/30/12  4:48 PM      Component Value Range Status Comment   Specimen Description ABSCESS LIVER   Final    Special Requests NONE   Final    Gram Stain     Final    Value: ABUNDANT WBC PRESENT,BOTH PMN AND MONONUCLEAR     NO SQUAMOUS EPITHELIAL CELLS SEEN     NO ORGANISMS SEEN   Culture FEW CITROBACTER KOSERI   Final    Report Status 04/02/2012 FINAL   Final    Organism ID, Bacteria CITROBACTER KOSERI   Final   CULTURE, BLOOD (ROUTINE X 2)     Status: Normal (Preliminary result)   Collection Time   03/31/12  2:10 PM      Component Value Range Status Comment   Specimen Description BLOOD LEFT LITTLE FINGER  3 ML IN AEROBIC ONLY   Final    Special Requests NONE   Final    Culture  Setup Time  03/31/2012 23:59   Final    Culture     Final    Value:        BLOOD CULTURE RECEIVED NO GROWTH TO DATE CULTURE WILL BE HELD FOR 5 DAYS BEFORE ISSUING A FINAL NEGATIVE REPORT   Report Status PENDING   Incomplete   CULTURE, BLOOD (ROUTINE X 2)     Status: Normal (Preliminary result)   Collection Time   03/31/12  2:34 PM      Component Value Range Status Comment   Specimen Description BLOOD LEFT HAND   Final    Special Requests BOTTLES DRAWN AEROBIC ONLY 3CC   Final    Culture  Setup Time 03/31/2012 23:59   Final    Culture     Final    Value:        BLOOD CULTURE RECEIVED NO GROWTH TO DATE CULTURE WILL BE HELD FOR 5 DAYS BEFORE ISSUING A FINAL NEGATIVE REPORT   Report Status  PENDING   Incomplete      CBC w Diff: Lab Results  Component Value Date   WBC 10.8* 04/02/2012   HGB 8.9* 04/02/2012   HCT 25.4* 04/02/2012   PLT 508* 04/02/2012   LYMPHOPCT 10* 03/26/2012   MONOPCT 11 03/26/2012   EOSPCT 0 03/26/2012   BASOPCT 1 03/26/2012    CMP: Lab Results  Component Value Date   NA 137 04/03/2012   K 3.9 04/03/2012   CL 102 04/03/2012   CO2 27 04/03/2012   BUN 10 04/03/2012   CREATININE 1.16* 04/03/2012   PROT 6.1 03/31/2012   ALBUMIN 1.9* 03/31/2012   BILITOT 2.7* 03/31/2012   ALKPHOS 139* 03/31/2012   AST 41* 03/31/2012   ALT 38* 03/31/2012  .   Discharge Instructions     Follow with Primary MD  in 4 days   Get CBC, CMP, checked for days by Primary MD and again as instructed by your Primary MD..  Get Medicines reviewed and adjusted.  Please request your Prim.MD to go over all Hospital Tests and Procedure/Radiological results at the follow up, please get all Hospital records sent to your Prim MD by signing hospital release before you go home.  Activity: As tolerated with Full fall precautions use walker/cane & assistance as needed   Diet:  Heart healthy  For Heart failure patients - Check your Weight same time everyday, if you gain over 2 pounds, or you develop in leg swelling, experience more shortness of breath or chest pain, call your Primary MD immediately. Follow Cardiac Low Salt Diet and 1.8 lit/day fluid restriction.  Disposition Home   If you experience worsening of your admission symptoms, develop shortness of breath, life threatening emergency, suicidal or homicidal thoughts you must seek medical attention immediately by calling 911 or calling your MD immediately  if symptoms less severe.  You Must read complete instructions/literature along with all the possible adverse reactions/side effects for all the Medicines you take and that have been prescribed to you. Take any new Medicines after you have completely understood and accpet all the possible  adverse reactions/side effects.   Do not drive if your were admitted for syncope or siezures until you have seen by Primary MD or a Neurologist and advised to drive.  Do not drive when taking Pain medications.    Do not take more than prescribed Pain, Sleep and Anxiety Medications  Special Instructions: If you have smoked or chewed Tobacco  in the last 2 yrs please stop smoking, stop any  regular Alcohol  and or any Recreational drug use.  Wear Seat belts while driving.   Follow-up Information    Follow up with PYRTLE, Carie Caddy, MD. Schedule an appointment as soon as possible for a visit in 1 week.   Contact information:   [address] [place] [address] [phone]       Follow up with Abundio Miu, MD. Schedule an appointment as soon as possible for a visit in 3 days.   Contact information:   [facility] [address] [place] [address] [phone]       Follow up with Staci Righter, MD. Schedule an appointment as soon as possible for a visit in 1 week.   Contact information:   [address] [place] [address] [phone]       Follow up with [facility], MD. Schedule an appointment as soon as possible for a visit in 1 week.   Contact information:   [address] [place] [address] [phone]       Follow up with Dorrene German, MD. Schedule an appointment as soon as possible for a visit in 4 days.   Contact information:   [address] [place] [address] [phone]            Discharge Medications   Medication List  As of 04/03/2012 11:37 AM   START taking these medications         HYDROcodone-acetaminophen 5-500 MG per tablet   Commonly known as: VICODIN   Take 1 tablet by mouth every 8 (eight) hours as needed for pain.      levofloxacin 750 MG tablet   Commonly known as: LEVAQUIN   Take 1 tablet (750 mg total) by mouth  daily.      metroNIDAZOLE 500 MG tablet   Commonly known as: FLAGYL   Take 1 tablet (500 mg total) by mouth every 8 (eight) hours.      saccharomyces boulardii 250 MG capsule   Commonly known as: FLORASTOR   Take 1 capsule (250 mg total) by mouth 2 (two) times daily.         CONTINUE taking these medications         aspirin 325 MG tablet      cetirizine 10 MG tablet   Commonly known as: ZYRTEC      cholecalciferol 1000 UNITS tablet   Commonly known as: VITAMIN D      levothyroxine 100 MCG tablet   Commonly known as: SYNTHROID, LEVOTHROID         STOP taking these medications         valsartan-hydrochlorothiazide 160-25 MG per tablet          Where to get your medications    These are the prescriptions that you need to pick up.   You may get these medications from any pharmacy.         HYDROcodone-acetaminophen 5-500 MG per tablet   levofloxacin 750 MG tablet   metroNIDAZOLE 500 MG tablet   saccharomyces boulardii 250 MG capsule               Total Time in preparing paper work, data evaluation and todays exam - 35 minutes  Leroy Sea M.D on 04/03/2012 at 11:37 AM  [facility] Office  [phone]

## 2012-04-03 NOTE — Plan of Care (Signed)
Problem: Food- and Nutrition-Related Knowledge Deficit (NB-1.1) Goal: Nutrition education Formal process to instruct or train a patient/client in a skill or to impart knowledge to help patients/clients voluntarily manage or modify food choices and eating behavior to maintain or improve health.  Outcome: Completed/Met Date Met:  04/03/12 Met with pt to review low sodium heart healthy diet for hypertension. Discussed pt's current dietary habits and areas for improvement. Discussed sources of sodium in the diet and how to read a food label. Provided handout of this information. Pt expressed understanding. Expect good compliance.

## 2012-04-03 NOTE — Progress Notes (Signed)
Physical Therapy Treatment Patient Details Name: Chelsey Savage MRN: [id number] DOB: 1941-03-14 Today's Date: 04/03/2012 Time: 1445-1500 PT Time Calculation (min): 15 min  PT Assessment / Plan / Recommendation Comments on Treatment Session  Pt continues to have deconditioning, but is able to move better with O2 sats 96% on RA.  She will benefit from use of RW initially and HHPT to continue strengthening at home    Follow Up Recommendations  Home health PT    Barriers to Discharge        Equipment Recommendations  Rolling walker with 5" wheels;Other (comment) (wide)    Recommendations for Other Services    Frequency     Plan Discharge plan remains appropriate    Precautions / Restrictions     Pertinent Vitals/Pain No c/o pain   Mobility  Bed Mobility Bed Mobility: Rolling Right;Right Sidelying to Sit Rolling Right: 7: Independent Right Sidelying to Sit: HOB flat;With rails;6: Modified independent (Device/Increase time) Details for Bed Mobility Assistance: Pt. did have difficulty coming to upright. Transfers Transfers: Sit to Stand;Stand to Sit Sit to Stand: From bed;7: Independent Stand to Sit: To chair/3-in-1;With upper extremity assist;7: Independent Details for Transfer Assistance: VC to push from bd and to reach to recliner. Ambulation/Gait Ambulation/Gait Assistance Details: pt feels like she will need a rw for home Gait Pattern: Step-through pattern Gait velocity: decreased General Gait Details: pt with some unsteadiness in gait and deconditioning from bedrest Wheelchair Mobility Wheelchair Mobility: No    Exercises     PT Diagnosis:    PT Problem List:   PT Treatment Interventions:     PT Goals Acute Rehab PT Goals PT Goal: Supine/Side to Sit - Progress: Met PT Goal: Sit to Supine/Side - Progress: Progressing toward goal PT Goal: Sit to Stand - Progress: Met PT Goal: Stand to Sit - Progress: Met PT Goal: Ambulate - Progress: Progressing toward  goal  Visit Information  Last PT Received On: 04/03/12 Assistance Needed: +1    Subjective Data  Subjective: pt getting ready to go home today.   Patient Stated Goal: to get a walker for home   Cognition  Overall Cognitive Status: Appears within functional limits for tasks assessed/performed Arousal/Alertness: Awake/alert Orientation Level: Appears intact for tasks assessed Behavior During Session: [facility] for tasks performed    Balance     End of Session PT - End of Session Activity Tolerance: Patient tolerated treatment well;Treatment limited secondary to medical complications (Comment);Other (comment) (pt needing to be on [facility]) Patient left:  (on [facility], nursing aware) Nurse Communication: Mobility status   GP     Donnetta Hail 04/03/2012, 3:06 PM

## 2012-04-03 NOTE — Progress Notes (Signed)
Discharge instructions explained to pt. And her son. Prescriptions given to son. Pt also received her walker before discharge.

## 2012-04-05 ENCOUNTER — Telehealth: Payer: Self-pay | Admitting: Internal Medicine

## 2012-04-05 NOTE — Telephone Encounter (Signed)
Are you handling this? Please advise. Thanks.   Pt discharged after liver abscess on 03/26/12.

## 2012-04-05 NOTE — Telephone Encounter (Signed)
No, interventional radiology placed the drain and should manage its care

## 2012-04-07 LAB — CULTURE, BLOOD (ROUTINE X 2)
Culture: NO GROWTH
Culture: NO GROWTH

## 2012-04-11 ENCOUNTER — Other Ambulatory Visit: Payer: Self-pay | Admitting: Diagnostic Radiology

## 2012-04-11 ENCOUNTER — Telehealth: Payer: Self-pay | Admitting: Gastroenterology

## 2012-04-11 ENCOUNTER — Other Ambulatory Visit (INDEPENDENT_AMBULATORY_CARE_PROVIDER_SITE_OTHER): Payer: Self-pay

## 2012-04-11 DIAGNOSIS — K750 Abscess of liver: Secondary | ICD-10-CM

## 2012-04-11 NOTE — Telephone Encounter (Signed)
Tammy at [facility] called asking if Dr. Rhea Belton would order a CT of the Abd with IV contrast. Per notes from Dr. Russella Dar pt is to follow up for liver abcess with Dr. Kinnie Scales or Dr. Ovidio Kin.

## 2012-04-11 NOTE — Progress Notes (Signed)
I was going to put in an order for a ct but I saw it was already ordered by Dr Lowella Dandy.  Disregard this encounter.

## 2012-04-12 ENCOUNTER — Telehealth (HOSPITAL_COMMUNITY): Payer: Self-pay

## 2012-04-12 ENCOUNTER — Ambulatory Visit (HOSPITAL_COMMUNITY)
Admission: RE | Admit: 2012-04-12 | Discharge: 2012-04-12 | Disposition: A | Discharge status: Home / Self Care | Payer: Medicare Other | Source: Ambulatory Visit | Attending: Diagnostic Radiology | Admitting: Diagnostic Radiology

## 2012-04-12 ENCOUNTER — Other Ambulatory Visit (HOSPITAL_COMMUNITY): Payer: Self-pay | Admitting: Diagnostic Radiology

## 2012-04-12 ENCOUNTER — Ambulatory Visit
Admission: RE | Admit: 2012-04-12 | Discharge: 2012-04-12 | Disposition: A | Discharge status: Home / Self Care | Payer: Medicare Other | Source: Ambulatory Visit | Attending: Diagnostic Radiology | Admitting: Diagnostic Radiology

## 2012-04-12 VITALS — BP 139/63 | HR 70 | Temp 98.3°F | Resp 16 | Ht 68.0 in | Wt 241.0 lb

## 2012-04-12 DIAGNOSIS — K750 Abscess of liver: Secondary | ICD-10-CM

## 2012-04-12 DIAGNOSIS — K7689 Other specified diseases of liver: Secondary | ICD-10-CM | POA: Insufficient documentation

## 2012-04-12 DIAGNOSIS — J90 Pleural effusion, not elsewhere classified: Secondary | ICD-10-CM | POA: Insufficient documentation

## 2012-04-12 DIAGNOSIS — I700 Atherosclerosis of aorta: Secondary | ICD-10-CM | POA: Insufficient documentation

## 2012-04-12 MED ORDER — IOHEXOL 300 MG/ML  SOLN
100.0000 mL | Freq: Once | INTRAMUSCULAR | Status: AC | PRN
  Administered 2012-04-12: 100 mL via INTRAVENOUS

## 2012-04-12 NOTE — Progress Notes (Signed)
Patient ID: Chelsey Savage, female   DOB: 24-Mar-1941, 71 y.o.   MRN: [id number] Chief Complaint: Follow-up liver abscess.  History: 71 year old female who was recently an inpatient at [facility] for a liver abscess. A percutaneous liver drain was placed on 03/30/2012. The patient returns for follow-up. She says that she is feeling much better. Her appetite is coming back and she has been afebrile. There is 20-25 ml of cloudy yellow fluid draining from the catheter each day. No one has been flushing the catheter.  Exam: Vitals: 147/69 mmHg, P=69, R=15, T=98.3; Abdominal exam: The abdomen is soft but the liver is palpable. The patient is nontender. The right percutaneous catheter is in place. Mild erythema around the catheter site. The catheter was flushed with 8 ml of sterile saline. Additional cloudy purulent fluid was aspirated.  Imaging: CT of the abdomen was performed on 04/12/2012. 1) There is a residual 8.4 x 5.8 cm abscess in the right hepatic lobe, improved. Indwelling pigtail drainage catheter. 2) Suspected focal wall thickening/enhancing soft tissue along the mid body of the gallbladder worrisome for a primary gallbladder neoplasm. I have personally reviewed the CT and agree that the liver abscess is decreasing in size. The residual abscess is complex and multiloculated. Majority of the abscess is along the dome which is above the drainage catheter.  Assessment and Plan: 71 year old with a right hepatic liver abscess. The liver abscess is decreasing in size by imaging. In addition, the patient feels much better. The liver abscess is very complex and multiloculated. I would like to inject the drain under fluoroscopy and possibly reposition the catheter within the residual locules of fluid. We will try to schedule the patient for interventional radiology in the next 2 days.  The patient will continue the oral antibiotics. I have written a prescription for home  health to flush the catheter once a day.  The recent imaging demonstrates cholelithiasis and focal gallbladder wall thickening. 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 Reportedly, the patient is supposed to follow up with Dr. Ovidio Kin at [facility]. I suspect that the patient will need a cholecystectomy at some time but that will obviously be up to Dr. Ezzard Standing.

## 2012-04-12 NOTE — Telephone Encounter (Signed)
I spoke with Chelsey Savage in regards to her apt.  She had a good understanding of the date and time

## 2012-04-12 NOTE — Progress Notes (Signed)
Patient reports that she has been emptying drain Qd.  Less than 25 cc.    Appetite improving.   Denies pain.  Denies nausea, vomiting or diarrhea.   Afebrile.    Sleeping good.  Endurance:  Good.

## 2012-04-14 ENCOUNTER — Other Ambulatory Visit: Payer: Self-pay | Admitting: Diagnostic Radiology

## 2012-04-14 ENCOUNTER — Ambulatory Visit (HOSPITAL_COMMUNITY)
Admission: RE | Admit: 2012-04-14 | Discharge: 2012-04-14 | Disposition: A | Discharge status: Home / Self Care | Payer: Medicare Other | Source: Ambulatory Visit | Attending: Diagnostic Radiology | Admitting: Diagnostic Radiology

## 2012-04-14 ENCOUNTER — Other Ambulatory Visit (HOSPITAL_COMMUNITY): Payer: Self-pay | Admitting: Diagnostic Radiology

## 2012-04-14 ENCOUNTER — Encounter (HOSPITAL_COMMUNITY): Payer: Self-pay

## 2012-04-14 DIAGNOSIS — K750 Abscess of liver: Secondary | ICD-10-CM

## 2012-04-14 DIAGNOSIS — I10 Essential (primary) hypertension: Secondary | ICD-10-CM | POA: Insufficient documentation

## 2012-04-14 MED ORDER — MIDAZOLAM HCL 5 MG/5ML IJ SOLN
INTRAMUSCULAR | Status: DC | PRN
  Administered 2012-04-14: 1 mg via INTRAVENOUS

## 2012-04-14 MED ORDER — IOHEXOL 300 MG/ML  SOLN
50.0000 mL | Freq: Once | INTRAMUSCULAR | Status: AC | PRN
  Administered 2012-04-14: 20 mL via INTRAVENOUS

## 2012-04-14 MED ORDER — FENTANYL CITRATE 0.05 MG/ML IJ SOLN
INTRAMUSCULAR | Status: DC | PRN
  Administered 2012-04-14: 50 ug via INTRAVENOUS

## 2012-04-14 MED ORDER — FENTANYL CITRATE 0.05 MG/ML IJ SOLN
INTRAMUSCULAR | Status: AC
  Filled 2012-04-14: qty 2

## 2012-04-14 MED ORDER — MIDAZOLAM HCL 2 MG/2ML IJ SOLN
INTRAMUSCULAR | Status: AC
  Filled 2012-04-14: qty 2

## 2012-04-14 NOTE — H&P (Signed)
Chief Complaint: "I'm here for drain injection" Referring Physician:Henn HPI: Chelsey Savage is an 71 y.o. female with hepatic abscess. See Dr. Lowella Dandy H&P from 8/28. Abscess is smaller but loculated. She is scheduled for drain injection and possible exchange/repositioning.  Past Medical History:  Past Medical History  Diagnosis Date  . Hypothyroid   . Hypertension   . Breast cancer   . Allergy     Past Surgical History:  Past Surgical History  Procedure Date  . Mastectomy     Family History: No family history on file.  Social History:  reports that she has never smoked. She does not have any smokeless tobacco history on file. She reports that she does not drink alcohol or use illicit drugs.  Allergies: No Known Allergies  Medications: Levaquin, Synthroid, Flagyl, Florastor, Vicodin  Please HPI for pertinent positives, otherwise complete 10 system ROS negative.  Physical Exam: Blood pressure 143/72, pulse 71, temperature 98 F (36.7 C), temperature source Oral, resp. rate 18, height 5\' 8"  (1.727 m), weight 241 lb (109.317 kg), SpO2 95.00%. Body mass index is 36.64 kg/(m^2).   General Appearance:  Alert, cooperative, no distress, appears stated age  Head:  Normocephalic, without obvious abnormality, atraumatic  ENT: Unremarkable  Neck: Supple, symmetrical, trachea midline, no adenopathy, thyroid: not enlarged, symmetric, no tenderness/mass/nodules  Lungs:   Clear to auscultation bilaterally, no w/r/r, respirations unlabored without use of accessory muscles.  Chest Wall:  No tenderness or deformity  Heart:  Regular rate and rhythm, S1, S2 normal, no murmur, rub or gallop. Carotids 2+ without bruit.  Abdomen:   Soft, non-tender, non distended. RUQ drain intact, site clean.  Neurologic: Normal affect, no gross deficits.   No results found for this or any previous visit (from the past 48 hour(s)). Ct Abdomen W Contrast  04/12/2012  *RADIOLOGY REPORT*  Clinical Data: Follow up  liver abscess  CT ABDOMEN WITH CONTRAST  Technique:  Multidetector CT imaging of the abdomen was performed following the standard protocol during bolus administration of intravenous contrast.  Contrast: OMNIPAQUE IOHEXOL 300 MG/ML  SOLN  Comparison: 04/03/2011  Findings: Small right pleural effusion, minimally increased, with associated right lower lobe atelectasis.  8.4 x 5.8 cm complex cystic lesion/abscess in the right hepatic lobe, previously 10.3 x 7.7 cm.  Indwelling pigtail drainage catheter inferiorly (series 2/image 15).  Spleen, pancreas, and right adrenal gland are within normal limits.  Stable mild nodularity of the left adrenal gland (series 2/image 22).  Cholelithiasis, without associated inflammatory changes.  However, there is suspected focal wall thickening/enhancing soft tissue along the midbody the gallbladder (series 2/image 35), worrisome for primary gallbladder neoplasm.  Kidneys are within normal limits.  No hydronephrosis.  Atherosclerotic calcifications of the abdominal aorta and branch vessels.  No abdominal ascites.  No suspicious abdominal lymphadenopathy.  No peritoneal/omental nodularity is evident.  Degenerative changes of the visualized thoracolumbar spine.  IMPRESSION: Residual 8.4 x 5.8 cm abscess in the right hepatic lobe, improved. Indwelling pigtail drainage catheter inferiorly.  Suspected focal wall thickening/enhancing soft tissue along the midbody the gallbladder, worrisome for primary gallbladder neoplasm.  These results were called by telephone on 04/12/2012 at 1225 hours to Dr. Richarda Overlie, who verbally acknowledged these results.   Original Report Authenticated By: [name], M.D.    [facility] Eval [facility]mt  04/12/2012  *RADIOLOGY REPORT*  ESTABLISHED PATIENT OFFICE VISIT - LEVEL II [phone])  Chief Complaint:  Follow-up liver abscess.  History:  71 year old female who was recently an  inpatient at [facility] for a liver abscess.  A percutaneous  liver drain was placed on 03/30/2012.  The patient returns for follow-up. She says that she is feeling much better.  Her appetite is coming back and she has been afebrile.  There is 20-25 ml of cloudy yellow fluid draining from the catheter each day.  No one has been flushing the catheter.  Exam:  Vitals:  147/69 mmHg, P=69, R=15, T=98.3; Abdominal exam: The abdomen is soft but the liver is palpable. The patient is nontender.  The right percutaneous catheter is in place.  Mild erythema around the catheter site.  The catheter was flushed with 8 ml of sterile saline.  Additional cloudy purulent fluid was aspirated.  Imaging:  CT of the abdomen was performed on 04/12/2012.  1) There is a residual 8.4 x 5.8 cm abscess in the right hepatic lobe, improved.  Indwelling pigtail drainage catheter.  2) Suspected focal wall thickening/enhancing soft tissue along the mid body of the gallbladder worrisome for a primary gallbladder neoplasm.  I have personally reviewed the CT and agree that the liver abscess is decreasing in size.  The residual abscess is complex and multiloculated.  Majority of the abscess is along the dome which is above the drainage catheter.  Assessment and Plan:  71 year old with a right hepatic liver abscess.  The liver abscess is decreasing in size by imaging.  In addition, the patient feels much better.  The liver abscess is very complex and multiloculated.  I would like to inject the drain under fluoroscopy and possibly reposition the catheter within the residual locules of fluid.  We will try to schedule the patient for interventional radiology in the next 2 days.  The patient will continue the oral antibiotics. I have written a prescription for home health to flush the catheter once a day.  The recent imaging demonstrates cholelithiasis and focal gallbladder wall thickening.  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  Reportedly, the patient is supposed to follow up with Dr. Ovidio Kin at  [facility].  I suspect that the patient will need a cholecystectomy at some time but that will obviously be up to Dr. Ezzard Standing.   Original Report Authenticated By: Richarda Overlie, M.D.     Assessment/Plan Hepatic abscess with RUQ perc drain. For drain injection today with exchange/repositioning. Reviewed procedure with pt and son, including risks and complications. Labs from last week ok. Consent signed in chart  Brayton El PA-C 04/14/2012, 7:50 AM

## 2012-04-14 NOTE — Procedures (Signed)
The drainage catheter was injected with contrast and demonstrated multiple small collections.  The drain was below the largest collection.  The drain was removed and a catheter was manipulated into larger collection.  A new 10 French drain was placed and positioned in larger collection.  A left brachial IV was placed with [place] access because the patient had no peripheral access.  Moderate sedation was given for this procedure.  Plan to follow up in clinic and drain injection.

## 2012-04-25 ENCOUNTER — Encounter: Payer: Self-pay | Admitting: Internal Medicine

## 2012-04-25 ENCOUNTER — Ambulatory Visit (INDEPENDENT_AMBULATORY_CARE_PROVIDER_SITE_OTHER): Payer: Medicare Other | Admitting: Internal Medicine

## 2012-04-25 VITALS — BP 143/80 | HR 78 | Temp 97.7°F | Ht 68.0 in | Wt 237.0 lb

## 2012-04-25 DIAGNOSIS — N179 Acute kidney failure, unspecified: Secondary | ICD-10-CM

## 2012-04-25 DIAGNOSIS — K750 Abscess of liver: Secondary | ICD-10-CM

## 2012-04-25 NOTE — Patient Instructions (Signed)
Continue with current antibiotics

## 2012-04-25 NOTE — Assessment & Plan Note (Signed)
She does have mildly decreased renal function that was on the full dose Levaquin. If she continues beyond 2 weeks we'll consider dose adjustment.

## 2012-04-25 NOTE — Assessment & Plan Note (Addendum)
She is doing well with her therapy. She will continue with the antibiotics for now pending reevaluation of the drain tomorrow. If the drain is removed, I will consider stopping therapy in 2 weeks. She will return in 2 weeks' time to see me.  She is on Flagyl as well due to presumed polymicrobial nature of this.  Total time > 45 minutes including coordination of care and evaluation of record

## 2012-04-25 NOTE — Progress Notes (Signed)
  Subjective:    Patient ID: Chelsey Savage, female    DOB: [birth year], 71 y.o.   MRN: [id number]  HPI She comes in as a new patient with liver abscess. She was discussed over the phone during her recent hospitalization where she was noted to have a liver abscess. A drain was placed and she continues to have a drain in place now for since her hospitalization. She did have this drained moved to another pocket and she is scheduled to have a reevaluation tomorrow. She feels well and has no significant side effects with the medication which is Levaquin and Flagyl. She has no fever or chills. She does complain of a metallic taste that she equates to be Flagyl. No pain in her abdomen. She did have a positive culture with Citrobacter which was pan sensitive.   Review of Systems  Constitutional: Negative for fever, chills, fatigue and unexpected weight change.  Gastrointestinal: Negative for nausea, abdominal pain and diarrhea.  Skin: Negative for rash.       Objective:   Physical Exam  Constitutional: She appears well-developed and well-nourished. No distress.       Morbidly obese  Abdominal:       JP drain in place with minimal serosanguineous drainage          Assessment & Plan:

## 2012-04-26 ENCOUNTER — Ambulatory Visit
Admission: RE | Admit: 2012-04-26 | Discharge: 2012-04-26 | Disposition: A | Discharge status: Home / Self Care | Payer: Medicare Other | Source: Ambulatory Visit | Attending: Diagnostic Radiology | Admitting: Diagnostic Radiology

## 2012-04-26 DIAGNOSIS — K750 Abscess of liver: Secondary | ICD-10-CM

## 2012-04-26 NOTE — Progress Notes (Signed)
Patient ID: Chelsey Savage, female   DOB: 1940/12/17, 71 y.o.   MRN: [id number] Chief Complaint: Follow-up liver abscess.  History: 71 year old with history of liver abscess and percutaneous drain. The drain was recently repositioned on 04/14/2012. Since the drain was repositioned, the patient states that there has been minimal drainage. She denies abdominal pain, fevers or chills. She is still taking oral antibiotics. She says that she has flushing the catheter once a day but it appears that she is flushing the suction bulb rather than the catheter.  Exam: Vitals: Temperature 98.1 degrees Fahrenheit, pulse 65, respirations 18, blood pressure 136/74 mmHg.  General: No acute distress.  Abdomen: The right upper abdominal abscess drain is still in place. The catheter site is clean. There is minimal red serous material within the suction bulb. I flushed the catheter with 8 ml of sterile saline but was unable to aspirate the fluid.  Imaging: The abscess drain was injected under fluoroscopy. The drain injection confirmed that the abscess cavity has resolved. The contrast is draining around the catheter and around the liver. As a result, the catheter was cut and removed.  Assessment and Plan: 71 year old female with history of liver abscess. The patient has done very well with antibiotics and percutaneous drain. It appears that the liver abscess has resolved based on the drain injection. As a result, the drain was completely removed.  Instructed the patient to continue the antibiotics as directed by Dr. Luciana Axe. I also reminded the patient that she needs to follow-up with Dr. Ezzard Standing at [facility]. Previous CT imaging demonstrated abnormality of the gallbladder which raised concern for gallbladder neoplasm.  No plans to follow up with Interventional Radiology at this time.

## 2012-05-09 ENCOUNTER — Ambulatory Visit (INDEPENDENT_AMBULATORY_CARE_PROVIDER_SITE_OTHER): Payer: Medicare Other | Admitting: Internal Medicine

## 2012-05-09 ENCOUNTER — Encounter: Payer: Self-pay | Admitting: Internal Medicine

## 2012-05-09 VITALS — BP 176/72 | HR 48 | Temp 98.7°F | Ht 65.0 in | Wt 247.0 lb

## 2012-05-09 DIAGNOSIS — K750 Abscess of liver: Secondary | ICD-10-CM

## 2012-05-09 NOTE — Progress Notes (Signed)
  Subjective:    Patient ID: Chelsey Savage, female    DOB: Nov 14, 1940, 71 y.o.   MRN: [id number]  HPI  Subjective:    Patient ID: Chelsey Savage, female    DOB: 08-16-1941, 71 y.o.   MRN: [id number]  HPI She comes in for followup of her liver abscess. She was seen prior to having reevaluation by interventional radiology and sense did have her drain removed since the abscess had completely resolved. She's had no problems including no fever or chills. She has continued with antibiotics but is about finished. No complaints   Review of Systems  Constitutional: Negative for fever, chills, fatigue and unexpected weight change.  Gastrointestinal: Negative for nausea, abdominal pain and diarrhea.  Skin: Negative for rash.       Objective:   Physical Exam  Constitutional: She appears well-developed and well-nourished. No distress.       Morbidly obese           Assessment & Plan:     Review of Systems     Objective:   Physical Exam        Assessment & Plan:

## 2012-05-09 NOTE — Patient Instructions (Signed)
Finish your current antibiotics and then stop

## 2012-05-09 NOTE — Assessment & Plan Note (Signed)
She is doing well and no need to continue antibiotic therapy since she has had complete drainage and completed an extra 2 weeks. She will return on a p.r.n. Basis.

## 2012-05-10 ENCOUNTER — Telehealth: Payer: Self-pay

## 2012-05-10 NOTE — Telephone Encounter (Signed)
Pt states her Diovan was discontinued in the hospital. She wonders if she needs to restart.  Her previous primary care physician started this medication.  She does not currently have a PCP.  Please advise.   Chelsey Savage K Ariella Voit,RN

## 2012-05-11 NOTE — Telephone Encounter (Signed)
I am not managing her blood pressure. She should get established with a PCP

## 2012-05-12 NOTE — Telephone Encounter (Signed)
Called patient and left a voice mail she needs to establish with a primary care doctor regarding her blood pressure. Wendall Mola CMA

## 2012-12-12 ENCOUNTER — Other Ambulatory Visit: Payer: Self-pay

## 2012-12-12 DIAGNOSIS — Z9011 Acquired absence of right breast and nipple: Secondary | ICD-10-CM

## 2012-12-12 DIAGNOSIS — Z1231 Encounter for screening mammogram for malignant neoplasm of breast: Secondary | ICD-10-CM

## 2013-01-19 ENCOUNTER — Ambulatory Visit
Admission: RE | Admit: 2013-01-19 | Discharge: 2013-01-19 | Disposition: A | Discharge status: Home / Self Care | Payer: Medicare Other | Source: Ambulatory Visit

## 2013-01-19 DIAGNOSIS — Z1231 Encounter for screening mammogram for malignant neoplasm of breast: Secondary | ICD-10-CM

## 2013-01-19 DIAGNOSIS — Z9011 Acquired absence of right breast and nipple: Secondary | ICD-10-CM

## 2014-01-29 ENCOUNTER — Other Ambulatory Visit: Payer: Self-pay

## 2014-01-29 DIAGNOSIS — Z1231 Encounter for screening mammogram for malignant neoplasm of breast: Secondary | ICD-10-CM

## 2014-02-03 ENCOUNTER — Emergency Department (HOSPITAL_COMMUNITY)
Admission: EM | Admit: 2014-02-03 | Discharge: 2014-02-03 | Disposition: A | Discharge status: Home / Self Care | Payer: Medicare Other | Attending: Emergency Medicine | Admitting: Emergency Medicine

## 2014-02-03 ENCOUNTER — Encounter (HOSPITAL_COMMUNITY): Payer: Self-pay | Admitting: Emergency Medicine

## 2014-02-03 DIAGNOSIS — J3489 Other specified disorders of nose and nasal sinuses: Secondary | ICD-10-CM | POA: Insufficient documentation

## 2014-02-03 DIAGNOSIS — I10 Essential (primary) hypertension: Secondary | ICD-10-CM | POA: Insufficient documentation

## 2014-02-03 DIAGNOSIS — Z79899 Other long term (current) drug therapy: Secondary | ICD-10-CM | POA: Insufficient documentation

## 2014-02-03 DIAGNOSIS — Z853 Personal history of malignant neoplasm of breast: Secondary | ICD-10-CM | POA: Insufficient documentation

## 2014-02-03 DIAGNOSIS — E039 Hypothyroidism, unspecified: Secondary | ICD-10-CM | POA: Insufficient documentation

## 2014-02-03 DIAGNOSIS — IMO0002 Reserved for concepts with insufficient information to code with codable children: Secondary | ICD-10-CM

## 2014-02-03 DIAGNOSIS — H8110 Benign paroxysmal vertigo, unspecified ear: Secondary | ICD-10-CM | POA: Insufficient documentation

## 2014-02-03 LAB — BASIC METABOLIC PANEL
BUN: 22 mg/dL (ref 6–23)
CHLORIDE: 97 meq/L (ref 96–112)
CO2: 25 meq/L (ref 19–32)
CREATININE: 1.32 mg/dL — AB (ref 0.50–1.10)
Calcium: 9.9 mg/dL (ref 8.4–10.5)
GFR calc Af Amer: 45 mL/min — ABNORMAL LOW (ref >90)
GFR calc non Af Amer: 39 mL/min — ABNORMAL LOW (ref >90)
GLUCOSE: 102 mg/dL — AB (ref 70–99)
Potassium: 3.3 mEq/L — ABNORMAL LOW (ref 3.7–5.3)
Sodium: 139 mEq/L (ref 137–147)

## 2014-02-03 LAB — CBC
HEMATOCRIT: 40 % (ref 36.0–46.0)
Hemoglobin: 14.1 g/dL (ref 12.0–15.0)
MCH: 28.7 pg (ref 26.0–34.0)
MCHC: 35.3 g/dL (ref 30.0–36.0)
MCV: 81.3 fL (ref 78.0–100.0)
Platelets: 239 10*3/uL (ref 150–400)
RBC: 4.92 MIL/uL (ref 3.87–5.11)
RDW: 13.5 % (ref 11.5–15.5)
WBC: 9.8 10*3/uL (ref 4.0–10.5)

## 2014-02-03 LAB — I-STAT TROPONIN, ED: Troponin i, poc: 0.01 ng/mL (ref 0.00–0.08)

## 2014-02-03 MED ORDER — MECLIZINE HCL 12.5 MG PO TABS: 12.5000 mg | ORAL_TABLET | Freq: Three times a day (TID) | ORAL | Status: DC | PRN

## 2014-02-03 MED ORDER — MECLIZINE HCL 25 MG PO TABS
25.0000 mg | ORAL_TABLET | Freq: Once | ORAL | Status: AC
  Administered 2014-02-03: 25 mg via ORAL
  Filled 2014-02-03: qty 1

## 2014-02-03 NOTE — ED Notes (Signed)
Pt reports to ed with c/o dizziness, sts had few episodes lately.

## 2014-02-03 NOTE — ED Notes (Signed)
Main Lab called for lab collection dt unsuccessful attempts at Lab draws.

## 2014-02-03 NOTE — ED Notes (Signed)
Attempted [place] IV w/o success. Robyn, [place] notified and gave order to do foot stick. Estill Bamberg, Phlebotomy to obtain samples

## 2014-02-03 NOTE — ED Notes (Addendum)
Adonis Brook called from lab and stated that she does not have enough blood to run Troponin. PA Robin notified of delay and Estill Bamberg A notified for another stick.

## 2014-02-03 NOTE — ED Notes (Signed)
Art stick performed by respiratory top obtain blood

## 2014-02-03 NOTE — Discharge Instructions (Signed)
Take meclizine as prescribed as needed for dizziness. Follow up with your primary care physician.  Benign Positional Vertigo Vertigo means you feel like you or your surroundings are moving when they are not. Benign positional vertigo is the most common form of vertigo. Benign means that the cause of your condition is not serious. Benign positional vertigo is more common in older adults. CAUSES  Benign positional vertigo is the result of an upset in the labyrinth system. This is an area in the middle ear that helps control your balance. This may be caused by a viral infection, head injury, or repetitive motion. However, often no specific cause is found. SYMPTOMS  Symptoms of benign positional vertigo occur when you move your head or eyes in different directions. Some of the symptoms may include:  Loss of balance and falls.  Vomiting.  Blurred vision.  Dizziness.  Nausea.  Involuntary eye movements (nystagmus). DIAGNOSIS  Benign positional vertigo is usually diagnosed by physical exam. If the specific cause of your benign positional vertigo is unknown, your caregiver may perform imaging tests, such as magnetic resonance imaging (MRI) or computed tomography (CT). TREATMENT  Your caregiver may recommend movements or procedures to correct the benign positional vertigo. Medicines such as meclizine, benzodiazepines, and medicines for nausea may be used to treat your symptoms. In rare cases, if your symptoms are caused by certain conditions that affect the inner ear, you may need surgery. HOME CARE INSTRUCTIONS   Follow your caregiver's instructions.  Move slowly. Do not make sudden body or head movements.  Avoid driving.  Avoid operating heavy machinery.  Avoid performing any tasks that would be dangerous to you or others during a vertigo episode.  Drink enough fluids to keep your urine clear or pale yellow. SEEK IMMEDIATE MEDICAL CARE IF:   You develop problems with walking,  weakness, numbness, or using your arms, hands, or legs.  You have difficulty speaking.  You develop severe headaches.  Your nausea or vomiting continues or gets worse.  You develop visual changes.  Your family or friends notice any behavioral changes.  Your condition gets worse.  You have a fever.  You develop a stiff neck or sensitivity to light. MAKE SURE YOU:   Understand these instructions.  Will watch your condition.  Will get help right away if you are not doing well or get worse. Document Released: 05/10/2006 Document Revised: 10/25/2011 Document Reviewed: 04/22/2011 [facility] Patient Information 2015 [place], [place]. This information is not intended to replace advice given to you by your health care provider. Make sure you discuss any questions you have with your health care provider.  Dizziness Dizziness is a common problem. It is a feeling of unsteadiness or light-headedness. You may feel like you are about to faint. Dizziness can lead to injury if you stumble or fall. A person of any age group can suffer from dizziness, but dizziness is more common in older adults. CAUSES  Dizziness can be caused by many different things, including:  Middle ear problems.  Standing for too long.  Infections.  An allergic reaction.  Aging.  An emotional response to something, such as the sight of blood.  Side effects of medicines.  Tiredness.  Problems with circulation or blood pressure.  Excessive use of alcohol or medicines, or illegal drug use.  Breathing too fast (hyperventilation).  An irregular heart rhythm (arrhythmia).  A low red blood cell count (anemia).  Pregnancy.  Vomiting, diarrhea, fever, or other illnesses that cause body fluid loss (dehydration).  Diseases or conditions such as Parkinson's disease, high blood pressure (hypertension), diabetes, and thyroid problems.  Exposure to extreme heat. DIAGNOSIS  Your health care provider will ask about  your symptoms, perform a physical exam, and perform an electrocardiogram (ECG) to record the electrical activity of your heart. Your health care provider may also perform other heart or blood tests to determine the cause of your dizziness. These may include:  Transthoracic echocardiogram (TTE). During echocardiography, sound waves are used to evaluate how blood flows through your heart.  Transesophageal echocardiogram (TEE).  Cardiac monitoring. This allows your health care provider to monitor your heart rate and rhythm in real time.  Holter monitor. This is a portable device that records your heartbeat and can help diagnose heart arrhythmias. It allows your health care provider to track your heart activity for several days if needed.  Stress tests by exercise or by giving medicine that makes the heart beat faster. TREATMENT  Treatment of dizziness depends on the cause of your symptoms and can vary greatly. HOME CARE INSTRUCTIONS   Drink enough fluids to keep your urine clear or pale yellow. This is especially important in very hot weather. In older adults, it is also important in cold weather.  Take your medicine exactly as directed if your dizziness is caused by medicines. When taking blood pressure medicines, it is especially important to get up slowly.  Rise slowly from chairs and steady yourself until you feel okay.  In the morning, first sit up on the side of the bed. When you feel okay, stand slowly while holding onto something until you know your balance is fine.  Move your legs often if you need to stand in one place for a long time. Tighten and relax your muscles in your legs while standing.  Have someone stay with you for 1-2 days if dizziness continues to be a problem. Do this until you feel you are well enough to stay alone. Have the person call your health care provider if he or she notices changes in you that are concerning.  Do not drive or use heavy machinery if you feel  dizzy.  Do not drink alcohol. SEEK IMMEDIATE MEDICAL CARE IF:   Your dizziness or light-headedness gets worse.  You feel nauseous or vomit.  You have problems talking, walking, or using your arms, hands, or legs.  You feel weak.  You are not thinking clearly or you have trouble forming sentences. It may take a friend or family member to notice this.  You have chest pain, abdominal pain, shortness of breath, or sweating.  Your vision changes.  You notice any bleeding.  You have side effects from medicine that seems to be getting worse rather than better. MAKE SURE YOU:   Understand these instructions.  Will watch your condition.  Will get help right away if you are not doing well or get worse. Document Released: 01/26/2001 Document Revised: 08/07/2013 Document Reviewed: 02/19/2011 [facility] Patient Information 2015 [place], [place]. This information is not intended to replace advice given to you by your health care provider. Make sure you discuss any questions you have with your health care provider.  Vertigo Vertigo means you feel like you or your surroundings are moving when they are not. Vertigo can be dangerous if it occurs when you are at work, driving, or performing difficult activities.  CAUSES  Vertigo occurs when there is a conflict of signals sent to your brain from the visual and sensory systems in your body. There  are many different causes of vertigo, including:  Infections, especially in the inner ear.  A bad reaction to a drug or misuse of alcohol and medicines.  Withdrawal from drugs or alcohol.  Rapidly changing positions, such as lying down or rolling over in bed.  A migraine headache.  Decreased blood flow to the brain.  Increased pressure in the brain from a head injury, infection, tumor, or bleeding. SYMPTOMS  You may feel as though the world is spinning around or you are falling to the ground. Because your balance is upset, vertigo can cause nausea  and vomiting. You may have involuntary eye movements (nystagmus). DIAGNOSIS  Vertigo is usually diagnosed by physical exam. If the cause of your vertigo is unknown, your caregiver may perform imaging tests, such as an MRI scan (magnetic resonance imaging). TREATMENT  Most cases of vertigo resolve on their own, without treatment. Depending on the cause, your caregiver may prescribe certain medicines. If your vertigo is related to body position issues, your caregiver may recommend movements or procedures to correct the problem. In rare cases, if your vertigo is caused by certain inner ear problems, you may need surgery. HOME CARE INSTRUCTIONS   Follow your caregiver's instructions.  Avoid driving.  Avoid operating heavy machinery.  Avoid performing any tasks that would be dangerous to you or others during a vertigo episode.  Tell your caregiver if you notice that certain medicines seem to be causing your vertigo. Some of the medicines used to treat vertigo episodes can actually make them worse in some people. SEEK IMMEDIATE MEDICAL CARE IF:   Your medicines do not relieve your vertigo or are making it worse.  You develop problems with talking, walking, weakness, or using your arms, hands, or legs.  You develop severe headaches.  Your nausea or vomiting continues or gets worse.  You develop visual changes.  A family member notices behavioral changes.  Your condition gets worse. MAKE SURE YOU:  Understand these instructions.  Will watch your condition.  Will get help right away if you are not doing well or get worse. Document Released: 05/12/2005 Document Revised: 10/25/2011 Document Reviewed: 02/18/2011 [facility] Patient Information 2015 [place], [place]. This information is not intended to replace advice given to you by your health care provider. Make sure you discuss any questions you have with your health care provider.

## 2014-02-03 NOTE — ED Notes (Signed)
2 unsuccessful attempts at blood draw. Will have another person to attempt.

## 2014-02-03 NOTE — ED Notes (Signed)
Requested from Dr. Jeneen Rinks if we could ask respiratory to perform an art stick for troponin lab. Dr. Jeneen Rinks gave verbal to proceed since pt is hard stick. Respiratory called and notified

## 2014-02-03 NOTE — ED Notes (Addendum)
Notified Chelsey Savage in the lab that blood is being sent in pediatric tubes d/t amt that could be obtained

## 2014-02-03 NOTE — ED Notes (Signed)
Unsuccessful attempts at Lab draws will inform provider and call main lab

## 2014-02-03 NOTE — ED Provider Notes (Signed)
Patient discussed with PA Gwenlyn Perking. She examined independently. History reviewed. I agree with Michele Mcalpine PAs assessment.  Seems to be in frequently occurring positional vertigo. She does not have carotid bruit. I cannot reproduce her symptoms with pressure over the carotid, neck movements, twisting, bending, or placing the patient in the position which she states initiates his symptoms. No nystagmus or vertigo laying supine or with position changes in bed. Agree with assessment and plan. Agree with EKG and limited labs and treatment with meclizine.  Tanna Furry, MD 02/03/14 [phone]

## 2014-02-03 NOTE — ED Notes (Signed)
PA at bedside.

## 2014-02-03 NOTE — ED Notes (Signed)
Unsuccessful attempt from [employer]. Will call IV Team.

## 2014-02-03 NOTE — ED Provider Notes (Signed)
CSN: [id number]     Arrival date & time 02/03/14  4034 History   First MD Initiated Contact with Patient 02/03/14 [phone]     Chief Complaint  Patient presents with  . Dizziness     (Consider location/radiation/quality/duration/timing/severity/associated sxs/prior Treatment) HPI Comments: 73 year old female with a past medical history of hypothyroidism, hypertension, or cancer and seasonal allergies presents emergency department complaining of dizziness beginning around 7:00 this morning. Patient states she feels dizzy when she leans towards the left only, subsiding when she stands up straight or is in any other position. Describes the dizziness as slight unsteadiness. Denies a sensation of spinning or the room spinning. Denies associated nausea, vomiting, headache, vision change, lightheadedness, numbness, weakness, chest pain, shortness of breath, fever or chills. States she has been slightly congested lately and has been using a nasal spray for this. States she had a similar episode about 6 weeks back that she did not get evaluated for and went away on its own.  Patient is a 73 y.o. female presenting with dizziness. The history is provided by the patient.  Dizziness   Past Medical History  Diagnosis Date  . Hypothyroid   . Hypertension   . Breast cancer   . Allergy    Past Surgical History  Procedure Laterality Date  . Mastectomy     No family history on file. History  Substance Use Topics  . Smoking status: Never Smoker   . Smokeless tobacco: Never Used  . Alcohol Use: No   OB History   Grav Para Term Preterm Abortions TAB SAB Ect Mult Living                 Review of Systems  Neurological: Positive for dizziness.  All other systems reviewed and are negative.     Allergies  Review of patient's allergies indicates no known allergies.  Home Medications   Prior to Admission medications   Medication Sig Start Date End Date Taking? Authorizing Provider  Calcium  Carbonate-Vitamin D (CALTRATE 600+D) 600-400 MG-UNIT per chew tablet Chew 1 tablet by mouth 2 (two) times daily with a meal.   Yes Historical Provider, MD  cetirizine (ZYRTEC) 10 MG tablet Take 10 mg by mouth daily.   Yes Historical Provider, MD  levothyroxine (SYNTHROID, LEVOTHROID) 100 MCG tablet Take 100 mcg by mouth daily.   Yes Historical Provider, MD  multivitamin-lutein (OCUVITE-LUTEIN) CAPS capsule Take 1 capsule by mouth daily.   Yes Historical Provider, MD  valsartan-hydrochlorothiazide (DIOVAN-HCT) 160-25 MG per tablet Take 1 tablet by mouth daily.  01/03/14  Yes Historical Provider, MD  meclizine (ANTIVERT) 12.5 MG tablet Take 1 tablet (12.5 mg total) by mouth 3 (three) times daily as needed for dizziness. 02/03/14   Illene Labrador, PA-C   BP 133/44  Pulse 62  Temp(Src) 98.3 F (36.8 C) (Oral)  Resp 18  SpO2 98% Physical Exam  Nursing note and vitals reviewed. Constitutional: She is oriented to person, place, and time. She appears well-developed and well-nourished. No distress.  HENT:  Head: Normocephalic and atraumatic.  Right Ear: Hearing, tympanic membrane and ear canal normal.  Left Ear: Hearing, tympanic membrane and ear canal normal.  Nose: Mucosal edema present. Right sinus exhibits no maxillary sinus tenderness and no frontal sinus tenderness. Left sinus exhibits no maxillary sinus tenderness and no frontal sinus tenderness.  Mouth/Throat: Oropharynx is clear and moist.  Eyes: Conjunctivae and EOM are normal. Pupils are equal, round, and reactive to light.  Neck: Normal range of motion.  Neck supple. No spinous process tenderness and no muscular tenderness present. Carotid bruit is not present.  Cardiovascular: Normal rate, regular rhythm, normal heart sounds and intact distal pulses.   No extremity edema.  Pulmonary/Chest: Effort normal and breath sounds normal. No respiratory distress.  Abdominal: Soft. Bowel sounds are normal. There is no tenderness.  Musculoskeletal:  Normal range of motion. She exhibits no edema.  Neurological: She is alert and oriented to person, place, and time. She has normal strength. No cranial nerve deficit or sensory deficit. She displays a negative Romberg sign. Coordination and gait normal. GCS eye subscore is 4. GCS verbal subscore is 5. GCS motor subscore is 6.  Strength upper and lower extremities 5/5 and equal bilateral. Moves limbs without ataxia. Speech fluent and goal oriented. Sensation intact to both sharp and light touch.  Skin: Skin is warm and dry. No rash noted. She is not diaphoretic.  Psychiatric: She has a normal mood and affect. Her behavior is normal.    ED Course  Procedures (including critical care time) Labs Review Labs Reviewed  BASIC METABOLIC PANEL - Abnormal; Notable for the following:    Potassium 3.3 (*)    Glucose, Bld 102 (*)    Creatinine, Ser 1.32 (*)    GFR calc non Af Amer 39 (*)    GFR calc Af Amer 45 (*)    All other components within normal limits  CBC  I-STAT TROPOININ, ED  I-STAT TROPOININ, ED    Imaging Review No results found.   EKG Interpretation   Date/Time:  Sunday February 03 2014 09:21:38 EDT Ventricular Rate:  57 PR Interval:  124 QRS Duration: 87 QT Interval:  485 QTC Calculation: 472 R Axis:   0 Text Interpretation:  Sinus rhythm No ST changes. No Ectopy. No comparison  EKG. Confirmed by Jeneen Rinks  MD, [place] ([id number]) on 02/03/2014 9:26:41 AM      MDM   Final diagnoses:  Positional vertigo, left   Patient presenting with dizziness when leaning towards the left only. She is well appearing and in no apparent distress. Vital signs stable, afebrile. No associated symptoms. Nonfocal neurologic exam. Plan to obtain basic labs including CBC, BMP, troponin along with EKG. Will give meclizine. Symptoms possibly from nasal congestion and allergies with sinus congestion, however she is already on flonase and zyrtec. No sinus tenderness. 2:18 PM Pt's symptoms have not returned  since receiving meclizine. Will d/c home with rx for meclizine. Workup negative. Creatinine at baseline. Troponin negative. Doubt cardiac or neurologic. Stable for d/c. F/u with PCP, she has appt this week. Return precautions given. Patient states understanding of treatment care plan and is agreeable.  Case discussed with attending Dr. Jeneen Rinks who also evaluated patient and agrees with plan of care.    Illene Labrador, PA-C 02/03/14 1420

## 2014-02-03 NOTE — ED Notes (Addendum)
Started having dizziness this a.m. About 0630 when she leaned over. No C/o weakness and denies any pain other than her chronic knee pain. She reports dizziness is only when she leans over and states " I just feel like I'm losing my balance".

## 2014-02-05 ENCOUNTER — Ambulatory Visit
Admission: RE | Admit: 2014-02-05 | Discharge: 2014-02-05 | Disposition: A | Discharge status: Home / Self Care | Payer: Medicare Other | Source: Ambulatory Visit

## 2014-02-05 DIAGNOSIS — Z1231 Encounter for screening mammogram for malignant neoplasm of breast: Secondary | ICD-10-CM

## 2014-02-06 NOTE — ED Provider Notes (Signed)
Medical screening examination/treatment/procedure(s) were conducted as a shared visit with non-physician practitioner(s) and myself.  I personally evaluated the patient during the encounter.   EKG Interpretation   Date/Time:  Sunday February 03 2014 09:21:38 EDT Ventricular Rate:  57 PR Interval:  124 QRS Duration: 87 QT Interval:  485 QTC Calculation: 472 R Axis:   0 Text Interpretation:  Sinus rhythm No ST changes. No Ectopy. No comparison  EKG. Confirmed by Jeneen Rinks  MD, [place] ([id number]) on 02/03/2014 9:26:41 AM      Patient seen and examined. Care discussed with Michele Mcalpine, PA.  Symptoms are reproduced on exam. No carotid bruits. This seems to be positional vertigo. EKG evaluation including labs are reassuring. I agree with evaluation and management. I agree with outpatient treatment with meclizine for vertigo.  Tanna Furry, MD 02/06/14 [phone]

## 2014-03-02 ENCOUNTER — Emergency Department (HOSPITAL_COMMUNITY)
Admission: EM | Admit: 2014-03-02 | Discharge: 2014-03-02 | Disposition: A | Discharge status: Home / Self Care | Payer: Medicare Other | Attending: Emergency Medicine | Admitting: Emergency Medicine

## 2014-03-02 ENCOUNTER — Encounter (HOSPITAL_COMMUNITY): Payer: Self-pay | Admitting: Emergency Medicine

## 2014-03-02 ENCOUNTER — Emergency Department (HOSPITAL_COMMUNITY): Payer: Medicare Other

## 2014-03-02 DIAGNOSIS — R5381 Other malaise: Secondary | ICD-10-CM | POA: Diagnosis present

## 2014-03-02 DIAGNOSIS — Z79899 Other long term (current) drug therapy: Secondary | ICD-10-CM | POA: Diagnosis not present

## 2014-03-02 DIAGNOSIS — I10 Essential (primary) hypertension: Secondary | ICD-10-CM | POA: Diagnosis not present

## 2014-03-02 DIAGNOSIS — E039 Hypothyroidism, unspecified: Secondary | ICD-10-CM | POA: Insufficient documentation

## 2014-03-02 DIAGNOSIS — Z853 Personal history of malignant neoplasm of breast: Secondary | ICD-10-CM | POA: Insufficient documentation

## 2014-03-02 DIAGNOSIS — K222 Esophageal obstruction: Secondary | ICD-10-CM

## 2014-03-02 DIAGNOSIS — K219 Gastro-esophageal reflux disease without esophagitis: Secondary | ICD-10-CM | POA: Insufficient documentation

## 2014-03-02 DIAGNOSIS — R5383 Other fatigue: Secondary | ICD-10-CM | POA: Diagnosis present

## 2014-03-02 LAB — CBC
HEMATOCRIT: 37.3 % (ref 36.0–46.0)
HEMOGLOBIN: 12.8 g/dL (ref 12.0–15.0)
MCH: 28.5 pg (ref 26.0–34.0)
MCHC: 34.3 g/dL (ref 30.0–36.0)
MCV: 83.1 fL (ref 78.0–100.0)
Platelets: 254 10*3/uL (ref 150–400)
RBC: 4.49 MIL/uL (ref 3.87–5.11)
RDW: 13.2 % (ref 11.5–15.5)
WBC: 11.9 10*3/uL — AB (ref 4.0–10.5)

## 2014-03-02 LAB — BASIC METABOLIC PANEL
ANION GAP: 15 (ref 5–15)
BUN: 21 mg/dL (ref 6–23)
CALCIUM: 10.2 mg/dL (ref 8.4–10.5)
CO2: 27 meq/L (ref 19–32)
CREATININE: 1.53 mg/dL — AB (ref 0.50–1.10)
Chloride: 97 mEq/L (ref 96–112)
GFR calc Af Amer: 38 mL/min — ABNORMAL LOW (ref >90)
GFR calc non Af Amer: 33 mL/min — ABNORMAL LOW (ref >90)
GLUCOSE: 120 mg/dL — AB (ref 70–99)
Potassium: 4.5 mEq/L (ref 3.7–5.3)
Sodium: 139 mEq/L (ref 137–147)

## 2014-03-02 LAB — I-STAT TROPONIN, ED: Troponin i, poc: 0 ng/mL (ref 0.00–0.08)

## 2014-03-02 MED ORDER — OMEPRAZOLE 20 MG PO CPDR: 20.0000 mg | DELAYED_RELEASE_CAPSULE | Freq: Every day | ORAL | Status: DC

## 2014-03-02 NOTE — ED Notes (Signed)
Pt reports hx of GERD that has worsened since Tuesday, unrelieved by OTC medication. Pt reports she has no trouble keeping down water, but she has only been able to keep down a little bit of food. Pt reports weakness r/t decreased food intake. Denies any pain at this time, states mid-chest pain only after eating. Pt A&O x 4.

## 2014-03-02 NOTE — ED Notes (Signed)
Pt states in the last week she has hx of reflux and feelings that food is going to come back up.  States she has had decreased appetite and weakness - "probably d/t not eating".  Pt has hx of reflux with no history of dilation or stricture.  Pt denies "chest pain".  Also recently had steroid shot on Monday in rt knee.  Pt states symptoms came up after shot.  Denies any additional prednisone or steroids post injection.

## 2014-03-02 NOTE — ED Provider Notes (Signed)
CSN: [id number]     Arrival date & time 03/02/14  1439 History   First MD Initiated Contact with Patient 03/02/14 1544     Chief Complaint  Patient presents with  . Gastrophageal Reflux  . Fatigue     (Consider location/radiation/quality/duration/timing/severity/associated sxs/prior Treatment) HPI Comments: 73 year old female the past medical history of acid reflux, hypothyroidism, hypertension and breast cancer presents to the emergency department complaining of an exacerbation of her acid reflux x5 days. Patient states she is able to swallow foods, however  feels the need to spit them back up. She does not need to spit backup "pinwheel cookies" or water. States since she has not been eating as much, she is starting to feel weak. She has an appetite but does not want to eat certain things. States she has been drinking a lot of coffee, orange juice and eating tomato sauce. The other day she had cubed steak and felt as if it was difficult to swallow. No pain with swallowing. She does not take any antacids or reflux medications. Denies chest pain, shortness of breath, nausea, vomiting, abdominal pain, fever, chills. Denies any history of esophageal dilation or stricture. She tried taking a holistic medication with no relief.  Patient is a 73 y.o. female presenting with GERD. The history is provided by the patient and a relative.  Gastrophageal Reflux Associated symptoms include weakness.    Past Medical History  Diagnosis Date  . Hypothyroid   . Hypertension   . Breast cancer   . Allergy    Past Surgical History  Procedure Laterality Date  . Mastectomy     History reviewed. No pertinent family history. History  Substance Use Topics  . Smoking status: Never Smoker   . Smokeless tobacco: Never Used  . Alcohol Use: No   OB History   Grav Para Term Preterm Abortions TAB SAB Ect Mult Living                 Review of Systems  Gastrointestinal:       + "spitting up" food.   Neurological: Positive for weakness.  All other systems reviewed and are negative.     Allergies  Review of patient's allergies indicates no known allergies.  Home Medications   Prior to Admission medications   Medication Sig Start Date End Date Taking? Authorizing Provider  Calcium Carbonate-Vitamin D (CALTRATE 600+D) 600-400 MG-UNIT per chew tablet Chew 1 tablet by mouth 2 (two) times daily with a meal.   Yes Historical Provider, MD  cetirizine (ZYRTEC) 10 MG tablet Take 10 mg by mouth daily.   Yes Historical Provider, MD  levothyroxine (SYNTHROID, LEVOTHROID) 100 MCG tablet Take 100 mcg by mouth daily.   Yes Historical Provider, MD  meclizine (ANTIVERT) 12.5 MG tablet Take 1 tablet (12.5 mg total) by mouth 3 (three) times daily as needed for dizziness. 02/03/14  Yes Wavie Hashimi Barrie Lyme, PA-C  multivitamin-lutein [facility]) CAPS capsule Take 1 capsule by mouth daily.   Yes Historical Provider, MD  valsartan-hydrochlorothiazide (DIOVAN-HCT) 160-25 MG per tablet Take 1 tablet by mouth daily.  01/03/14  Yes Historical Provider, MD  omeprazole (PRILOSEC) 20 MG capsule Take 1 capsule (20 mg total) by mouth daily. 03/02/14   Illene Labrador, PA-C   BP 139/54  Pulse 68  Temp(Src) 98.3 F (36.8 C) (Oral)  Resp 18  SpO2 96% Physical Exam  Nursing note and vitals reviewed. Constitutional: She is oriented to person, place, and time. She appears well-developed and well-nourished. No distress.  HENT:  Head: Normocephalic and atraumatic.  Mouth/Throat: Oropharynx is clear and moist.  Eyes: Conjunctivae and EOM are normal. Pupils are equal, round, and reactive to light.  Neck: Normal range of motion. Neck supple. No JVD present.  Cardiovascular: Normal rate, regular rhythm, normal heart sounds and intact distal pulses.   Pulmonary/Chest: Effort normal and breath sounds normal. No respiratory distress.  Abdominal: Soft. Bowel sounds are normal. She exhibits no distension. There is no  tenderness.  Musculoskeletal: Normal range of motion. She exhibits no edema.  Neurological: She is alert and oriented to person, place, and time. She has normal strength. No sensory deficit.  Speech fluent, goal oriented. Moves limbs without ataxia. Equal grip strength bilateral.  Skin: Skin is warm and dry. She is not diaphoretic.  Psychiatric: She has a normal mood and affect. Her behavior is normal.    ED Course  Procedures (including critical care time) Labs Review Labs Reviewed  CBC - Abnormal; Notable for the following:    WBC 11.9 (*)    All other components within normal limits  BASIC METABOLIC PANEL - Abnormal; Notable for the following:    Glucose, Bld 120 (*)    Creatinine, Ser 1.53 (*)    GFR calc non Af Amer 33 (*)    GFR calc Af Amer 38 (*)    All other components within normal limits  Randolm Idol, ED    Imaging Review Dg Chest 2 View  03/02/2014   CLINICAL DATA:  Fatigue. Weakness. Hypertension. Gastroesophageal reflux.  EXAM: CHEST  2 VIEW  COMPARISON:  03/31/2012  FINDINGS: Right mastectomy. Atherosclerotic aortic arch. Heart size within normal limits.  Thoracic spondylosis. The lungs appear clear. No acute thoracic findings.  IMPRESSION: 1. No acute thoracic findings. 2. Incidental findings include atherosclerosis, thoracic spondylosis, and right mastectomy.   Electronically Signed   By: Sherryl Barters M.D.   On: 03/02/2014 16:40     EKG Interpretation   Date/Time:  Saturday March 02 2014 15:27:13 EDT Ventricular Rate:  59 PR Interval:  114 QRS Duration: 83 QT Interval:  465 QTC Calculation: 461 R Axis:   -5 Text Interpretation:  Sinus rhythm Borderline short PR interval Abnormal  R-wave progression, early transition LVH by voltage No significant change  was found Confirmed by CAMPOS  MD, KEVIN ([id number]) on 03/02/2014 3:36:18 PM      MDM   Final diagnoses:  GERD with stricture   Pt presenting with symptoms of acid reflux. No abdominal pain, CP,  n/v. She is well appearing and in NAD. AFVSS. Doubt cardiac in nature, however will obtain CP workup given age. She is eating crackers and drinking water and able to tolerate. Plan to d/c home on PPI, refer to GI, pt most likely has esophageal stricture. 5:47 PM Workup negative. Labs at baseline. Patient stable for discharge home with Prilosec. Followup with her gastroenterologist Dr. Hilarie Fredrickson who she has seen in the past. Return precautions given. Patient states understanding of treatment care plan and is agreeable.  Case discussed with attending Dr. Venora Maples who also evaluated patient and agrees with plan of care.     Illene Labrador, PA-C 03/02/14 [phone]

## 2014-03-02 NOTE — ED Provider Notes (Signed)
Medical screening examination/treatment/procedure(s) were conducted as a shared visit with non-physician practitioner(s) and myself.  I personally evaluated the patient during the encounter.   EKG Interpretation   Date/Time:  Saturday March 02 2014 15:27:13 EDT Ventricular Rate:  59 PR Interval:  114 QRS Duration: 83 QT Interval:  465 QTC Calculation: 461 R Axis:   -5 Text Interpretation:  Sinus rhythm Borderline short PR interval Abnormal  R-wave progression, early transition LVH by voltage No significant change  was found Confirmed by Tylon Kemmerling  MD, Terasa Orsini ([id number]) on 03/02/2014 3:36:18 PM      Suspect GERD and developing esophageal stricture. Outpatient GI follow up. Soft diet   Hoy Morn, MD 03/02/14 [phone]

## 2014-03-02 NOTE — Discharge Instructions (Signed)
Take prilosec as directed daily for your reflux. Follow up with your gastroenterologist.  Esophageal Stricture The esophagus is the long, narrow tube which carries food and liquid from the mouth to the stomach. Sometimes a part of the esophagus becomes narrow and makes it difficult, painful, or even impossible to swallow. This is called an esophageal stricture.  CAUSES  Common causes of blockage or strictures of the esophagus are:  Exposure of the lower esophagus to the acid from the stomach may cause narrowing.  Hiatal hernia in which a small part of the stomach bulges up through the diaphragm can cause a narrowing in the bottom of the esophagus.  Scleroderma is a tissue disorder that affects the esophagus and makes swallowing difficult.  Achalasia is an absence of nerves in the lower esophagus and to the esophageal sphincter. This absence of nerves may be congenital (present since birth). This can cause irregular spasms which do not allow food and fluid through.  Strictures may develop from swallowing materials which damage the esophagus. Examples are acids or alkalis such as lye.  Schatzki's Ring is a narrow ring of non-cancerous tissue which narrows the lower esophagus. The cause of this is unknown.  Growths can block the esophagus. SYMPTOMS  Some of the problems are difficulty swallowing or pain with swallowing. DIAGNOSIS  Your caregiver often suspects this problem by taking a medical history. They will also do a physical exam. They may then take X-rays and/or perform an endoscopy. Endoscopy is an exam in which a tube like a small flexible telescope is used to look at your esophagus.  TREATMENT  One form of treatment is to dilate the narrow area. This means to stretch it.  When this is not successful, chest surgery may be required. This is a much more extensive form of treatment with a longer recovery time. Both of the above treatments make the passage of food and water into the  stomach easier. They also make it easier for stomach contents to bubble back into the esophagus. Special medications may be used following the procedure to help prevent further narrowing. Medications may be used to lower the amount of acid in the stomach juice.  SEEK IMMEDIATE MEDICAL CARE IF:   Your swallowing is becoming more painful, difficult, or you are unable to swallow.  You vomit up blood.  You develop black tarry stools.  You develop chills.  You have a fever.  You develop chest or abdominal pain.  You develop shortness of breath, feel lightheaded, or faint. Follow up with medical care as your caregiver suggests. Document Released: 04/12/2006 Document Revised: 10/25/2011 Document Reviewed: 05/19/2006 [facility] Patient Information 2015 [place], [place]. This information is not intended to replace advice given to you by your health care provider. Make sure you discuss any questions you have with your health care provider.  Gastroesophageal Reflux Disease, Adult Gastroesophageal reflux disease (GERD) happens when acid from your stomach flows up into the esophagus. When acid comes in contact with the esophagus, the acid causes soreness (inflammation) in the esophagus. Over time, GERD may create small holes (ulcers) in the lining of the esophagus. CAUSES   Increased body weight. This puts pressure on the stomach, making acid rise from the stomach into the esophagus.  Smoking. This increases acid production in the stomach.  Drinking alcohol. This causes decreased pressure in the lower esophageal sphincter (valve or ring of muscle between the esophagus and stomach), allowing acid from the stomach into the esophagus.  Late evening meals and  a full stomach. This increases pressure and acid production in the stomach.  A malformed lower esophageal sphincter. Sometimes, no cause is found. SYMPTOMS   Burning pain in the lower part of the mid-chest behind the breastbone and in the  mid-stomach area. This may occur twice a week or more often.  Trouble swallowing.  Sore throat.  Dry cough.  Asthma-like symptoms including chest tightness, shortness of breath, or wheezing. DIAGNOSIS  Your caregiver may be able to diagnose GERD based on your symptoms. In some cases, X-rays and other tests may be done to check for complications or to check the condition of your stomach and esophagus. TREATMENT  Your caregiver may recommend over-the-counter or prescription medicines to help decrease acid production. Ask your caregiver before starting or adding any new medicines.  HOME CARE INSTRUCTIONS   Change the factors that you can control. Ask your caregiver for guidance concerning weight loss, quitting smoking, and alcohol consumption.  Avoid foods and drinks that make your symptoms worse, such as:  Caffeine or alcoholic drinks.  Chocolate.  Peppermint or mint flavorings.  Garlic and onions.  Spicy foods.  Citrus fruits, such as oranges, lemons, or limes.  Tomato-based foods such as sauce, chili, salsa, and pizza.  Fried and fatty foods.  Avoid lying down for the 3 hours prior to your bedtime or prior to taking a nap.  Eat small, frequent meals instead of large meals.  Wear loose-fitting clothing. Do not wear anything tight around your waist that causes pressure on your stomach.  Raise the head of your bed 6 to 8 inches with wood blocks to help you sleep. Extra pillows will not help.  Only take over-the-counter or prescription medicines for pain, discomfort, or fever as directed by your caregiver.  Do not take aspirin, ibuprofen, or other nonsteroidal anti-inflammatory drugs (NSAIDs). SEEK IMMEDIATE MEDICAL CARE IF:   You have pain in your arms, neck, jaw, teeth, or back.  Your pain increases or changes in intensity or duration.  You develop nausea, vomiting, or sweating (diaphoresis).  You develop shortness of breath, or you faint.  Your vomit is green,  yellow, black, or looks like coffee grounds or blood.  Your stool is red, bloody, or black. These symptoms could be signs of other problems, such as heart disease, gastric bleeding, or esophageal bleeding. MAKE SURE YOU:   Understand these instructions.  Will watch your condition.  Will get help right away if you are not doing well or get worse. Document Released: 05/12/2005 Document Revised: 10/25/2011 Document Reviewed: 02/19/2011 [facility] Patient Information 2015 [place], [place]. This information is not intended to replace advice given to you by your health care provider. Make sure you discuss any questions you have with your health care provider.

## 2014-03-04 ENCOUNTER — Telehealth: Payer: Self-pay | Admitting: Internal Medicine

## 2014-03-04 NOTE — Telephone Encounter (Signed)
Patient was in the ER for dysphagia/  She will come in and see Tye Savoy RNP on 03/08/14 1:45 for a 2.  She verbalized understanding of appt date and time

## 2014-03-06 ENCOUNTER — Encounter: Payer: Self-pay | Admitting: *Deleted

## 2014-03-08 ENCOUNTER — Ambulatory Visit (INDEPENDENT_AMBULATORY_CARE_PROVIDER_SITE_OTHER): Payer: Medicare Other | Admitting: Nurse Practitioner

## 2014-03-08 ENCOUNTER — Encounter: Payer: Self-pay | Admitting: Nurse Practitioner

## 2014-03-08 VITALS — BP 110/54 | HR 68 | Ht 65.75 in | Wt 246.1 lb

## 2014-03-08 DIAGNOSIS — R1319 Other dysphagia: Secondary | ICD-10-CM | POA: Insufficient documentation

## 2014-03-08 NOTE — Patient Instructions (Signed)
You have been scheduled for a Barium Esophogram with tablet at [facility] (1st floor of the hospital) on 03-13-2014 at 930 am. Please arrive 15 minutes prior to your appointment for registration. Make certain not to have anything to eat or drink 6 hours prior to your test. If you need to reschedule for any reason, please contact radiology at [phone] to do so. __________________________________________________________________ A barium swallow is an examination that concentrates on views of the esophagus. This tends to be a double contrast exam (barium and two liquids which, when combined, create a gas to distend the wall of the oesophagus) or single contrast (non-ionic iodine based). The study is usually tailored to your symptoms so a good history is essential. Attention is paid during the study to the form, structure and configuration of the esophagus, looking for functional disorders (such as aspiration, dysphagia, achalasia, motility and reflux) EXAMINATION You may be asked to change into a gown, depending on the type of swallow being performed. A radiologist and radiographer will perform the procedure. The radiologist will advise you of the type of contrast selected for your procedure and direct you during the exam. You will be asked to stand, sit or lie in several different positions and to hold a small amount of fluid in your mouth before being asked to swallow while the imaging is performed .In some instances you may be asked to swallow barium coated marshmallows to assess the motility of a solid food bolus. The exam can be recorded as a digital or video fluoroscopy procedure. POST PROCEDURE It will take 1-2 days for the barium to pass through your system. To facilitate this, it is important, unless otherwise directed, to increase your fluids for the next 24-48hrs and to resume your normal diet.  This test typically takes about 30 minutes to  perform. __________________________________________________________________________________

## 2014-03-08 NOTE — Progress Notes (Addendum)
History of Present Illness:   Patient is a 73 year old female who we met when she was hospitalized August 2013. We saw her at that time for anemia  and an large liver mass which turned out to be an abscess.  The patient comes in today for evaluation of what she refers to as reflux. She was in the emergency department on Tuesday for a several day history of swallowing problems. No significant heartburn. No regurgitation. Patient describes reflux as feeling like food gets stuck in chest. No odynophagia. She otherwise feels fine. ED started her on Prilosec  Current Medications, Allergies, Past Medical History, Past Surgical History, Family History and Social History were reviewed in [employer] record.  Studies:   Dg Chest 2 View  03/02/2014   CLINICAL DATA:  Fatigue. Weakness. Hypertension. Gastroesophageal reflux.  EXAM: CHEST  2 VIEW  COMPARISON:  03/31/2012  FINDINGS: Right mastectomy. Atherosclerotic aortic arch. Heart size within normal limits.  Thoracic spondylosis. The lungs appear clear. No acute thoracic findings.  IMPRESSION: 1. No acute thoracic findings. 2. Incidental findings include atherosclerosis, thoracic spondylosis, and right mastectomy.   Electronically Signed   By: Sherryl Barters M.D.   On: 03/02/2014 16:40   Physical Exam: General: Pleasant, obese, black female in no acute distress Head: Normocephalic and atraumatic Eyes:  sclerae anicteric, conjunctiva pink  Ears: Normal auditory acuity Lungs: Clear throughout to auscultation Heart: Regular rate and rhythm Abdomen: Soft, non distended, non-tender. No masses, no hepatomegaly. Normal bowel sounds Musculoskeletal: Symmetrical with no gross deformities  Extremities: No edema  Neurological: Alert oriented x 4, grossly nonfocal Psychological:  Alert and cooperative. Normal mood and affect  Assessment and Recommendations:  pleasant 73 year old female with solid food dysphasia, relatively new  onset. No significant GERD history. Emergency Department recently started her on Prilosec. Rule out stricture, could just be dysmotility. For further evaluation will schedule barium swallow with tablet. If stricture present then patient will likely need EGD with dilation.     Addendum: 03/13/14. Received colonoscopy report from Dr. Irish Lack.  1. Colonoscopy March 2005 (initial screening). One polyps removed from left colon. Path c/w adenoma  2. Colonoscopy for surveillance June 2008. Exam to cecum, excellent prep. No polyps, + mild diverticulosis  Will scan reports into Epic. Timing of surveillance colonoscopy per primary GI physician.   Addendum: Reviewed and agree with initial management. Barium swallow has been performed  FINDINGS:  The patient ingested thick and thin barium without difficulty. There  was no laryngeal penetration of the barium. The thoracic esophagus  distended well. 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 There  was no fixed stricture nor evidence of esophagitis. There was a  small reducible hiatal hernia. No reflux was elicited. The barium  tablet passed without difficulty. 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  IMPRESSION:  There is no fixed stricture nor evidence of esophagitis nor other  acute abnormality of the esophagus. 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  Given these findings, there is likely an element of dysmotility. Would continue daily PPI. Dilation would likely not provide benefit given the lack of stricture as demonstrated by barium swallow with tablet Her history of adenomatous colon polyps is reviewed, last colonoscopy '08 normal without polyps, surveillance would have been due in 2013, thus she is due now for surveillance colonoscopy, which I recommend This can be scheduled with me or Dr. Earlean Shawl who  performed her previous  colonoscopies  Jerene Bears, MD

## 2014-03-13 ENCOUNTER — Ambulatory Visit (HOSPITAL_COMMUNITY)
Admission: RE | Admit: 2014-03-13 | Discharge: 2014-03-13 | Disposition: A | Discharge status: Home / Self Care | Payer: Medicare Other | Source: Ambulatory Visit | Attending: Nurse Practitioner | Admitting: Nurse Practitioner

## 2014-03-13 DIAGNOSIS — R1310 Dysphagia, unspecified: Secondary | ICD-10-CM | POA: Diagnosis present

## 2014-03-13 DIAGNOSIS — K449 Diaphragmatic hernia without obstruction or gangrene: Secondary | ICD-10-CM | POA: Insufficient documentation

## 2014-03-13 DIAGNOSIS — K2289 Other specified disease of esophagus: Secondary | ICD-10-CM | POA: Diagnosis not present

## 2014-03-13 DIAGNOSIS — K228 Other specified diseases of esophagus: Secondary | ICD-10-CM | POA: Diagnosis not present

## 2014-03-13 DIAGNOSIS — R1319 Other dysphagia: Secondary | ICD-10-CM

## 2014-05-20 ENCOUNTER — Encounter: Payer: Self-pay | Admitting: Internal Medicine

## 2014-05-20 ENCOUNTER — Ambulatory Visit: Payer: Medicare Other | Admitting: Internal Medicine

## 2014-05-20 NOTE — Progress Notes (Signed)
Patient ID: Chelsey Savage, female   DOB: [birth year], 73 y.o.   MRN: [id number]  The patient's chart has been reviewed by Dr. Hilarie Fredrickson  and the recommendations are noted below.  No follow up necessary Follow-up urgent.  Locate patient immediately. Follow-up necessary. Contact patient and schedule visit in ___ weeks. Follow-up advised. Contact patient and schedule visit in ___ weeks.  Outcome of communication with the patient:    Patient also seen by Dr. Earlean Shawl, followup as needed  Letter mailed to patient  rms

## 2014-09-21 ENCOUNTER — Emergency Department (HOSPITAL_COMMUNITY)
Admission: EM | Admit: 2014-09-21 | Discharge: 2014-09-21 | Disposition: A | Discharge status: Home / Self Care | Payer: Medicare Other | Attending: Emergency Medicine | Admitting: Emergency Medicine

## 2014-09-21 ENCOUNTER — Encounter (HOSPITAL_COMMUNITY): Payer: Self-pay

## 2014-09-21 DIAGNOSIS — E039 Hypothyroidism, unspecified: Secondary | ICD-10-CM | POA: Diagnosis not present

## 2014-09-21 DIAGNOSIS — Z853 Personal history of malignant neoplasm of breast: Secondary | ICD-10-CM | POA: Diagnosis not present

## 2014-09-21 DIAGNOSIS — K219 Gastro-esophageal reflux disease without esophagitis: Secondary | ICD-10-CM | POA: Insufficient documentation

## 2014-09-21 DIAGNOSIS — Z79899 Other long term (current) drug therapy: Secondary | ICD-10-CM | POA: Insufficient documentation

## 2014-09-21 DIAGNOSIS — I10 Essential (primary) hypertension: Secondary | ICD-10-CM | POA: Diagnosis not present

## 2014-09-21 DIAGNOSIS — Z86010 Personal history of colonic polyps: Secondary | ICD-10-CM | POA: Insufficient documentation

## 2014-09-21 DIAGNOSIS — J069 Acute upper respiratory infection, unspecified: Secondary | ICD-10-CM | POA: Diagnosis not present

## 2014-09-21 DIAGNOSIS — Z87891 Personal history of nicotine dependence: Secondary | ICD-10-CM | POA: Insufficient documentation

## 2014-09-21 DIAGNOSIS — R05 Cough: Secondary | ICD-10-CM | POA: Diagnosis present

## 2014-09-21 MED ORDER — FLUTICASONE PROPIONATE 50 MCG/ACT NA SUSP: 2.0000 | Freq: Every day | NASAL | Status: DC

## 2014-09-21 NOTE — ED Provider Notes (Signed)
CSN: [id number]     Arrival date & time 09/21/14  0958 History   First MD Initiated Contact with Patient 09/21/14 1006     Chief Complaint  Patient presents with  . Sinus congestion   . Headache  . Cough     (Consider location/radiation/quality/duration/timing/severity/associated sxs/prior Treatment) Patient is a 74 y.o. female presenting with headaches and cough. The history is provided by the patient.  Headache Associated symptoms: congestion, cough and sinus pressure   Associated symptoms: no abdominal pain, no back pain, no diarrhea, no dizziness, no pain, no fatigue, no fever, no nausea, no neck pain and no vomiting   Congestion:    Location:  Nasal Cough:    Cough characteristics:  Non-productive   Severity:  Mild   Onset quality:  Gradual   Duration:  1 week   Timing:  Constant   Progression:  Unchanged   Chronicity:  New Cough Associated symptoms: chest pain (mild chest wall pain w/ cough) and rhinorrhea   Associated symptoms: no fever, no headaches and no shortness of breath     Past Medical History  Diagnosis Date  . Hypertension   . Breast cancer     right  . Allergy   . GERD (gastroesophageal reflux disease)   . Hypothyroidism   . Liver abscess   . Colon polyp   . Vertigo    Past Surgical History  Procedure Laterality Date  . Mastectomy Right 1979  . Heel spur surgery Right    Family History  Problem Relation Age of Onset  . Anuerysm Mother     type unknown  . Diabetes Son     x 2   History  Substance Use Topics  . Smoking status: Former Smoker    Types: Cigarettes    Quit date: 08/16/1997  . Smokeless tobacco: Never Used  . Alcohol Use: No   OB History    No data available     Review of Systems  Constitutional: Negative for fever and fatigue.  HENT: Positive for congestion, rhinorrhea, sinus pressure and sneezing. Negative for drooling.   Eyes: Negative for pain.  Respiratory: Positive for cough. Negative for shortness of breath.    Cardiovascular: Positive for chest pain (mild chest wall pain w/ cough).  Gastrointestinal: Negative for nausea, vomiting, abdominal pain and diarrhea.  Genitourinary: Negative for dysuria and hematuria.  Musculoskeletal: Negative for back pain, gait problem and neck pain.  Skin: Negative for color change.  Neurological: Negative for dizziness and headaches.  Hematological: Negative for adenopathy.  Psychiatric/Behavioral: Negative for behavioral problems.  All other systems reviewed and are negative.     Allergies  Review of patient's allergies indicates no known allergies.  Home Medications   Prior to Admission medications   Medication Sig Start Date End Date Taking? Authorizing Provider  Calcium Carbonate-Vitamin D (CALTRATE 600+D) 600-400 MG-UNIT per chew tablet Chew 1 tablet by mouth 2 (two) times daily with a meal.   Yes Historical Provider, MD  cetirizine (ZYRTEC) 10 MG tablet Take 10 mg by mouth daily.   Yes Historical Provider, MD  levothyroxine (SYNTHROID, LEVOTHROID) 125 MCG tablet Take 125 mcg by mouth daily before breakfast.   Yes Historical Provider, MD  meclizine (ANTIVERT) 12.5 MG tablet Take 1 tablet (12.5 mg total) by mouth 3 (three) times daily as needed for dizziness. 02/03/14  Yes Robyn M Hess, PA-C  multivitamin-lutein (OCUVITE-LUTEIN) CAPS capsule Take 1 capsule by mouth daily.   Yes Historical Provider, MD  omeprazole (PRILOSEC) 20  MG capsule Take 1 capsule (20 mg total) by mouth daily. 03/02/14  Yes Robyn M Hess, PA-C  potassium chloride (MICRO-K) 10 MEQ CR capsule Take 10 mEq by mouth 2 (two) times daily.   Yes Historical Provider, MD  valsartan-hydrochlorothiazide (DIOVAN-HCT) 160-25 MG per tablet Take 1 tablet by mouth daily.  01/03/14  Yes Historical Provider, MD   BP 132/58 mmHg  Pulse 63  Temp(Src) 98.5 F (36.9 C) (Oral)  Resp 16  SpO2 97% Physical Exam  Constitutional: She is oriented to person, place, and time. She appears well-developed and  well-nourished.  HENT:  Head: Normocephalic.  Mouth/Throat: Oropharynx is clear and moist. No oropharyngeal exudate.  Normal-appearing tympanic membranes bilaterally.  No significant lymphadenopathy on the head or neck.  No mastoid tenderness.  Eyes: Conjunctivae and EOM are normal. Pupils are equal, round, and reactive to light.  Neck: Normal range of motion. Neck supple.  Cardiovascular: Normal rate, regular rhythm, normal heart sounds and intact distal pulses.  Exam reveals no gallop and no friction rub.   No murmur heard. Pulmonary/Chest: Effort normal and breath sounds normal. No respiratory distress. She has no wheezes.  Abdominal: Soft. Bowel sounds are normal. There is no tenderness. There is no rebound and no guarding.  Musculoskeletal: Normal range of motion. She exhibits no edema or tenderness.  Neurological: She is alert and oriented to person, place, and time.  Normal strength and sensation in all extremities.  Skin: Skin is warm and dry.  Psychiatric: She has a normal mood and affect. Her behavior is normal.  Nursing note and vitals reviewed.   ED Course  Procedures (including critical care time) Labs Review Labs Reviewed - No data to display  Imaging Review No results found.   EKG Interpretation None      MDM   Final diagnoses:  Viral URI    10:44 AM 74 y.o. female w hx of HTN, hypothyroidism who presents with URI symptoms for the last week. She denies any fevers. She has had a mild cough, nasal congestion, and today began feeling generalized weakness. She is afebrile and vital signs are unremarkable here. I offered a chest x-ray but do not think it is absolutely necessary, she agrees. Will recommend symptomatic treatment as I believe this is just a viral URI. Will recommend Flonase, daily Zyrtec, and scheduled Tylenol and next few days. She denies HA on my exam only has sinus congestion.   10:46 AM:  I have discussed the diagnosis/risks/treatment options  with the patient and believe the pt to be eligible for discharge home to follow-up with her pcp in 3-4 days if no better. We also discussed returning to the ED immediately if new or worsening sx occur. We discussed the sx which are most concerning (e.g., fever, worsening cough, sob) that necessitate immediate return. Medications administered to the patient during their visit and any new prescriptions provided to the patient are listed below.  Medications given during this visit Medications - No data to display  New Prescriptions   FLUTICASONE (FLONASE) 50 MCG/ACT NASAL SPRAY    Place 2 sprays into both nostrils daily.       Pamella Pert, MD 09/21/14 1053

## 2014-09-21 NOTE — Discharge Instructions (Signed)
Upper Respiratory Infection, Adult An upper respiratory infection (URI) is also sometimes known as the common cold. The upper respiratory tract includes the nose, sinuses, throat, trachea, and bronchi. Bronchi are the airways leading to the lungs. Most people improve within 1 week, but symptoms can last up to 2 weeks. A residual cough may last even longer.  CAUSES Many different viruses can infect the tissues lining the upper respiratory tract. The tissues become irritated and inflamed and often become very moist. Mucus production is also common. A cold is contagious. You can easily spread the virus to others by oral contact. This includes kissing, sharing a glass, coughing, or sneezing. Touching your mouth or nose and then touching a surface, which is then touched by another person, can also spread the virus. SYMPTOMS  Symptoms typically develop 1 to 3 days after you come in contact with a cold virus. Symptoms vary from person to person. They may include:  Runny nose.  Sneezing.  Nasal congestion.  Sinus irritation.  Sore throat.  Loss of voice (laryngitis).  Cough.  Fatigue.  Muscle aches.  Loss of appetite.  Headache.  Low-grade fever. DIAGNOSIS  You might diagnose your own cold based on familiar symptoms, since most people get a cold 2 to 3 times a year. Your caregiver can confirm this based on your exam. Most importantly, your caregiver can check that your symptoms are not due to another disease such as strep throat, sinusitis, pneumonia, asthma, or epiglottitis. Blood tests, throat tests, and X-rays are not necessary to diagnose a common cold, but they may sometimes be helpful in excluding other more serious diseases. Your caregiver will decide if any further tests are required. RISKS AND COMPLICATIONS  You may be at risk for a more severe case of the common cold if you smoke cigarettes, have chronic heart disease (such as heart failure) or lung disease (such as asthma), or if  you have a weakened immune system. The very young and very old are also at risk for more serious infections. Bacterial sinusitis, middle ear infections, and bacterial pneumonia can complicate the common cold. The common cold can worsen asthma and chronic obstructive pulmonary disease (COPD). Sometimes, these complications can require emergency medical care and may be life-threatening. PREVENTION  The best way to protect against getting a cold is to practice good hygiene. Avoid oral or hand contact with people with cold symptoms. Wash your hands often if contact occurs. There is no clear evidence that vitamin C, vitamin E, echinacea, or exercise reduces the chance of developing a cold. However, it is always recommended to get plenty of rest and practice good nutrition. TREATMENT  Treatment is directed at relieving symptoms. There is no cure. Antibiotics are not effective, because the infection is caused by a virus, not by bacteria. Treatment may include:  Increased fluid intake. Sports drinks offer valuable electrolytes, sugars, and fluids.  Breathing heated mist or steam (vaporizer or shower).  Eating chicken soup or other clear broths, and maintaining good nutrition.  Getting plenty of rest.  Using gargles or lozenges for comfort.  Controlling fevers with ibuprofen or acetaminophen as directed by your caregiver.  Increasing usage of your inhaler if you have asthma. Zinc gel and zinc lozenges, taken in the first 24 hours of the common cold, can shorten the duration and lessen the severity of symptoms. Pain medicines may help with fever, muscle aches, and throat pain. A variety of non-prescription medicines are available to treat congestion and runny nose. Your caregiver   can make recommendations and may suggest nasal or lung inhalers for other symptoms.  HOME CARE INSTRUCTIONS   Only take over-the-counter or prescription medicines for pain, discomfort, or fever as directed by your  caregiver.  Use a warm mist humidifier or inhale steam from a shower to increase air moisture. This may keep secretions moist and make it easier to breathe.  Drink enough water and fluids to keep your urine clear or pale yellow.  Rest as needed.  Return to work when your temperature has returned to normal or as your caregiver advises. You may need to stay home longer to avoid infecting others. You can also use a face mask and careful hand washing to prevent spread of the virus. SEEK MEDICAL CARE IF:   After the first few days, you feel you are getting worse rather than better.  You need your caregiver's advice about medicines to control symptoms.  You develop chills, worsening shortness of breath, or brown or red sputum. These may be signs of pneumonia.  You develop yellow or brown nasal discharge or pain in the face, especially when you bend forward. These may be signs of sinusitis.  You develop a fever, swollen neck glands, pain with swallowing, or white areas in the back of your throat. These may be signs of strep throat. SEEK IMMEDIATE MEDICAL CARE IF:   You have a fever.  You develop severe or persistent headache, ear pain, sinus pain, or chest pain.  You develop wheezing, a prolonged cough, cough up blood, or have a change in your usual mucus (if you have chronic lung disease).  You develop sore muscles or a stiff neck. Document Released: 01/26/2001 Document Revised: 10/25/2011 Document Reviewed: 11/07/2013 [employer] Patient Information 2015 [employer]. This information is not intended to replace advice given to you by your health care provider. Make sure you discuss any questions you have with your health care provider.  

## 2014-09-21 NOTE — ED Notes (Signed)
Pt c/o sinus congestion, headache, cough, and generally not feeling well x 1 week.  Denies pain.  Pt has used nose spray and cough drops w/ little relief.

## 2015-01-16 ENCOUNTER — Other Ambulatory Visit: Payer: Self-pay

## 2015-01-16 DIAGNOSIS — Z1231 Encounter for screening mammogram for malignant neoplasm of breast: Secondary | ICD-10-CM

## 2015-02-18 ENCOUNTER — Ambulatory Visit
Admission: RE | Admit: 2015-02-18 | Discharge: 2015-02-18 | Disposition: A | Discharge status: Home / Self Care | Payer: Medicare Other | Source: Ambulatory Visit

## 2015-02-18 DIAGNOSIS — Z1231 Encounter for screening mammogram for malignant neoplasm of breast: Secondary | ICD-10-CM

## 2015-07-11 ENCOUNTER — Encounter (HOSPITAL_COMMUNITY): Payer: Self-pay | Admitting: Emergency Medicine

## 2015-07-11 ENCOUNTER — Emergency Department (HOSPITAL_COMMUNITY)
Admission: EM | Admit: 2015-07-11 | Discharge: 2015-07-11 | Disposition: A | Discharge status: Home / Self Care | Payer: Medicare Other | Attending: Emergency Medicine | Admitting: Emergency Medicine

## 2015-07-11 DIAGNOSIS — Z7951 Long term (current) use of inhaled steroids: Secondary | ICD-10-CM | POA: Diagnosis not present

## 2015-07-11 DIAGNOSIS — Z86010 Personal history of colonic polyps: Secondary | ICD-10-CM | POA: Diagnosis not present

## 2015-07-11 DIAGNOSIS — Z79899 Other long term (current) drug therapy: Secondary | ICD-10-CM | POA: Insufficient documentation

## 2015-07-11 DIAGNOSIS — K219 Gastro-esophageal reflux disease without esophagitis: Secondary | ICD-10-CM | POA: Diagnosis not present

## 2015-07-11 DIAGNOSIS — Z853 Personal history of malignant neoplasm of breast: Secondary | ICD-10-CM | POA: Insufficient documentation

## 2015-07-11 DIAGNOSIS — G8929 Other chronic pain: Secondary | ICD-10-CM | POA: Insufficient documentation

## 2015-07-11 DIAGNOSIS — I10 Essential (primary) hypertension: Secondary | ICD-10-CM | POA: Insufficient documentation

## 2015-07-11 DIAGNOSIS — Z87891 Personal history of nicotine dependence: Secondary | ICD-10-CM | POA: Diagnosis not present

## 2015-07-11 DIAGNOSIS — R197 Diarrhea, unspecified: Secondary | ICD-10-CM | POA: Diagnosis not present

## 2015-07-11 DIAGNOSIS — E039 Hypothyroidism, unspecified: Secondary | ICD-10-CM | POA: Insufficient documentation

## 2015-07-11 HISTORY — DX: Other chronic pain: G89.29

## 2015-07-11 HISTORY — DX: Pain in unspecified knee: M25.569

## 2015-07-11 LAB — COMPREHENSIVE METABOLIC PANEL
ALBUMIN: 3.8 g/dL (ref 3.5–5.0)
ALT: 13 U/L — AB (ref 14–54)
AST: 16 U/L (ref 15–41)
Alkaline Phosphatase: 70 U/L (ref 38–126)
Anion gap: 9 (ref 5–15)
BUN: 22 mg/dL — AB (ref 6–20)
CHLORIDE: 102 mmol/L (ref 101–111)
CO2: 25 mmol/L (ref 22–32)
CREATININE: 1.82 mg/dL — AB (ref 0.44–1.00)
Calcium: 9.8 mg/dL (ref 8.9–10.3)
GFR calc Af Amer: 30 mL/min — ABNORMAL LOW (ref >60)
GFR calc non Af Amer: 26 mL/min — ABNORMAL LOW (ref >60)
GLUCOSE: 128 mg/dL — AB (ref 65–99)
Potassium: 3.8 mmol/L (ref 3.5–5.1)
SODIUM: 136 mmol/L (ref 135–145)
Total Bilirubin: 0.7 mg/dL (ref 0.3–1.2)
Total Protein: 7.7 g/dL (ref 6.5–8.1)

## 2015-07-11 LAB — URINALYSIS, ROUTINE W REFLEX MICROSCOPIC
Bilirubin Urine: NEGATIVE
Glucose, UA: NEGATIVE mg/dL
Ketones, ur: NEGATIVE mg/dL
Nitrite: NEGATIVE
PROTEIN: NEGATIVE mg/dL
Specific Gravity, Urine: 1.02 (ref 1.005–1.030)
pH: 5 (ref 5.0–8.0)

## 2015-07-11 LAB — CBC
HCT: 36 % (ref 36.0–46.0)
Hemoglobin: 12.2 g/dL (ref 12.0–15.0)
MCH: 27.7 pg (ref 26.0–34.0)
MCHC: 33.9 g/dL (ref 30.0–36.0)
MCV: 81.6 fL (ref 78.0–100.0)
PLATELETS: 252 10*3/uL (ref 150–400)
RBC: 4.41 MIL/uL (ref 3.87–5.11)
RDW: 13.5 % (ref 11.5–15.5)
WBC: 9.9 10*3/uL (ref 4.0–10.5)

## 2015-07-11 LAB — URINE MICROSCOPIC-ADD ON

## 2015-07-11 LAB — LIPASE, BLOOD: Lipase: 25 U/L (ref 11–51)

## 2015-07-11 MED ORDER — LOPERAMIDE HCL 2 MG PO CAPS
4.0000 mg | ORAL_CAPSULE | ORAL | Status: DC | PRN
  Administered 2015-07-11: 4 mg via ORAL
  Filled 2015-07-11: qty 2

## 2015-07-11 NOTE — ED Notes (Signed)
Patient c/o diarrhea x5-6 episodes since 0200. Patient denies anyone else at home being ill. Patient denies recent antibiotic use. Patient took mylanta at home. Patient reports recently having blood in her urine at her PCP office.

## 2015-07-11 NOTE — ED Provider Notes (Addendum)
CSN: [id number]     Arrival date & time 07/11/15  [id number] History   First MD Initiated Contact with Patient 07/11/15 0913     Chief Complaint  Patient presents with  . Diarrhea     (Consider location/radiation/quality/duration/timing/severity/associated sxs/prior Treatment) HPI Comments: Patient presents with diarrhea. She states she woke up this morning about 2 AM and started having loose stools. She's had 5-6 episodes of stool since that time. They're loose and nonbloody. She denies abdominal pain. She denies any nausea or vomiting. She feels like it may be something that she ate yesterday. She denies any recent antibiotic usage. She denies any urinary symptoms although she was told about a week ago by her PCP that she had some blood in her urine and a probable UTI. She was not started on antibiotics.  Patient is a 75 y.o. female presenting with diarrhea.  Diarrhea Associated symptoms: no abdominal pain, no arthralgias, no chills, no diaphoresis, no fever, no headaches and no vomiting     Past Medical History  Diagnosis Date  . Hypertension   . Breast cancer ([place])     right  . Allergy   . GERD (gastroesophageal reflux disease)   . Hypothyroidism   . Liver abscess   . Colon polyp   . Vertigo   . Chronic knee pain     right   Past Surgical History  Procedure Laterality Date  . Mastectomy Right 1979  . Heel spur surgery Right   . Tonsillectomy     Family History  Problem Relation Age of Onset  . Anuerysm Mother     type unknown  . Diabetes Son     x 2   Social History  Substance Use Topics  . Smoking status: Former Smoker    Types: Cigarettes    Quit date: 08/16/1997  . Smokeless tobacco: Never Used  . Alcohol Use: No   OB History    No data available     Review of Systems  Constitutional: Negative for fever, chills, diaphoresis and fatigue.  HENT: Negative for congestion, rhinorrhea and sneezing.   Eyes: Negative.   Respiratory: Negative for cough, chest  tightness and shortness of breath.   Cardiovascular: Negative for chest pain and leg swelling.  Gastrointestinal: Positive for diarrhea. Negative for nausea, vomiting, abdominal pain and blood in stool.  Genitourinary: Negative for frequency, hematuria, flank pain and difficulty urinating.  Musculoskeletal: Negative for back pain and arthralgias.  Skin: Negative for rash.  Neurological: Negative for dizziness, speech difficulty, weakness, numbness and headaches.      Allergies  Review of patient's allergies indicates no known allergies.  Home Medications   Prior to Admission medications   Medication Sig Start Date End Date Taking? Authorizing Provider  acetaminophen (TYLENOL) 650 MG CR tablet Take 1,300 mg by mouth daily as needed for pain.   Yes Historical Provider, MD  Calcium Carbonate-Vitamin D (CALTRATE 600+D) 600-400 MG-UNIT per chew tablet Chew 1 tablet by mouth 2 (two) times daily with a meal.   Yes Historical Provider, MD  cetirizine (ZYRTEC) 10 MG tablet Take 10 mg by mouth daily.   Yes Historical Provider, MD  levothyroxine (SYNTHROID, LEVOTHROID) 100 MCG tablet Take 100 mcg by mouth daily before breakfast.   Yes Historical Provider, MD  multivitamin-lutein (OCUVITE-LUTEIN) CAPS capsule Take 1 capsule by mouth daily.   Yes Historical Provider, MD  omeprazole (PRILOSEC) 20 MG capsule Take 1 capsule (20 mg total) by mouth daily. 03/02/14  Yes Carman Ching,  PA-C  potassium chloride (MICRO-K) 10 MEQ CR capsule Take 10 mEq by mouth 2 (two) times daily.   Yes Historical Provider, MD  valsartan-hydrochlorothiazide (DIOVAN-HCT) 160-25 MG per tablet Take 1 tablet by mouth daily.  01/03/14  Yes Historical Provider, MD  fluticasone (FLONASE) 50 MCG/ACT nasal spray Place 2 sprays into both nostrils daily. 09/21/14 09/28/14  Pamella Pert, MD  meclizine (ANTIVERT) 12.5 MG tablet Take 1 tablet (12.5 mg total) by mouth 3 (three) times daily as needed for dizziness. 02/03/14   Robyn M Hess, PA-C    BP 141/63 mmHg  Pulse 63  Temp(Src) 98.7 F (37.1 C) (Oral)  Resp 18  Ht 5\' 5"  (1.651 m)  Wt 250 lb (113.399 kg)  BMI 41.60 kg/m2  SpO2 98% Physical Exam  Constitutional: She is oriented to person, place, and time. She appears well-developed and well-nourished.  HENT:  Head: Normocephalic and atraumatic.  Eyes: Pupils are equal, round, and reactive to light.  Neck: Normal range of motion. Neck supple.  Cardiovascular: Normal rate, regular rhythm and normal heart sounds.   Pulmonary/Chest: Effort normal and breath sounds normal. No respiratory distress. She has no wheezes. She has no rales. She exhibits no tenderness.  Abdominal: Soft. Bowel sounds are normal. There is no tenderness. There is no rebound and no guarding.  Musculoskeletal: Normal range of motion. She exhibits no edema.  Lymphadenopathy:    She has no cervical adenopathy.  Neurological: She is alert and oriented to person, place, and time.  Skin: Skin is warm and dry. No rash noted.  Psychiatric: She has a normal mood and affect.    ED Course  Procedures (including critical care time) Labs Review Results for orders placed or performed during the hospital encounter of 07/11/15  Lipase, blood  Result Value Ref Range   Lipase 25 11 - 51 U/L  Comprehensive metabolic panel  Result Value Ref Range   Sodium 136 135 - 145 mmol/L   Potassium 3.8 3.5 - 5.1 mmol/L   Chloride 102 101 - 111 mmol/L   CO2 25 22 - 32 mmol/L   Glucose, Bld 128 (H) 65 - 99 mg/dL   BUN 22 (H) 6 - 20 mg/dL   Creatinine, Ser 1.82 (H) 0.44 - 1.00 mg/dL   Calcium 9.8 8.9 - 10.3 mg/dL   Total Protein 7.7 6.5 - 8.1 g/dL   Albumin 3.8 3.5 - 5.0 g/dL   AST 16 15 - 41 U/L   ALT 13 (L) 14 - 54 U/L   Alkaline Phosphatase 70 38 - 126 U/L   Total Bilirubin 0.7 0.3 - 1.2 mg/dL   GFR calc non Af Amer 26 (L) >60 mL/min   GFR calc Af Amer 30 (L) >60 mL/min   Anion gap 9 5 - 15  CBC  Result Value Ref Range   WBC 9.9 4.0 - 10.5 K/uL   RBC 4.41 3.87  - 5.11 MIL/uL   Hemoglobin 12.2 12.0 - 15.0 g/dL   HCT 36.0 36.0 - 46.0 %   MCV 81.6 78.0 - 100.0 fL   MCH 27.7 26.0 - 34.0 pg   MCHC 33.9 30.0 - 36.0 g/dL   RDW 13.5 11.5 - 15.5 %   Platelets 252 150 - 400 K/uL  Urinalysis, Routine w reflex microscopic (not at [facility])  Result Value Ref Range   Color, Urine YELLOW YELLOW   APPearance CLOUDY (A) CLEAR   Specific Gravity, Urine 1.020 1.005 - 1.030   pH 5.0 5.0 - 8.0  Glucose, UA NEGATIVE NEGATIVE mg/dL   Hgb urine dipstick SMALL (A) NEGATIVE   Bilirubin Urine NEGATIVE NEGATIVE   Ketones, ur NEGATIVE NEGATIVE mg/dL   Protein, ur NEGATIVE NEGATIVE mg/dL   Nitrite NEGATIVE NEGATIVE   Leukocytes, UA MODERATE (A) NEGATIVE  Urine microscopic-add on  Result Value Ref Range   Squamous Epithelial / LPF 0-5 (A) NONE SEEN   WBC, UA 6-30 0 - 5 WBC/hpf   RBC / HPF 6-30 0 - 5 RBC/hpf   Bacteria, UA FEW (A) NONE SEEN   Casts HYALINE CASTS (A) NEGATIVE   No results found.    Imaging Review No results found. I have personally reviewed and evaluated these images and lab results as part of my medical decision-making.   EKG Interpretation None       MDM   Final diagnoses:  Diarrhea, unspecified type    Patient presents with diarrhea starting several hours ago. She has no associated abdominal pain or fevers. No vomiting. Her abdominal exam is benign. She's had no further episodes of diarrhea in the ED. Her labs are unremarkable. Her creatinine is elevated but it's only slightly higher than her prior values. She does have some leukocytes and a small amount of blood in her urine. She is currently not having urinary symptoms. It was sent for culture but I did not treated with antibiotics at this point. She was encouraged in clear fluids and bland diet to help with the diarrhea. She was encouraged to follow-up with her PCP. Return precautions were given. She does not have risk factors for C. difficile.    Malvin Johns, MD 07/11/15  [place], MD 07/11/15 [phone]

## 2015-07-11 NOTE — Discharge Instructions (Signed)
Diarrhea Diarrhea is frequent loose and watery bowel movements. It can cause you to feel weak and dehydrated. Dehydration can cause you to become tired and thirsty, have a dry mouth, and have decreased urination that often is dark yellow. Diarrhea is a sign of another problem, most often an infection that will not last long. In most cases, diarrhea typically lasts 2-3 days. However, it can last longer if it is a sign of something more serious. It is important to treat your diarrhea as directed by your caregiver to lessen or prevent future episodes of diarrhea. CAUSES  Some common causes include:  Gastrointestinal infections caused by viruses, bacteria, or parasites.  Food poisoning or food allergies.  Certain medicines, such as antibiotics, chemotherapy, and laxatives.  Artificial sweeteners and fructose.  Digestive disorders. HOME CARE INSTRUCTIONS  Ensure adequate fluid intake (hydration): Have 1 cup (8 oz) of fluid for each diarrhea episode. Avoid fluids that contain simple sugars or sports drinks, fruit juices, whole milk products, and sodas. Your urine should be clear or pale yellow if you are drinking enough fluids. Hydrate with an oral rehydration solution that you can purchase at pharmacies, retail stores, and online. You can prepare an oral rehydration solution at home by mixing the following ingredients together:   - tsp table salt.   tsp baking soda.   tsp salt substitute containing potassium chloride.  1  tablespoons sugar.  1 L (34 oz) of water.  Certain foods and beverages may increase the speed at which food moves through the gastrointestinal (GI) tract. These foods and beverages should be avoided and include:  Caffeinated and alcoholic beverages.  High-fiber foods, such as raw fruits and vegetables, nuts, seeds, and whole grain breads and cereals.  Foods and beverages sweetened with sugar alcohols, such as xylitol, sorbitol, and mannitol.  Some foods may be well  tolerated and may help thicken stool including:  Starchy foods, such as rice, toast, pasta, low-sugar cereal, oatmeal, grits, baked potatoes, crackers, and bagels.  Bananas.  Applesauce.  Add probiotic-rich foods to help increase healthy bacteria in the GI tract, such as yogurt and fermented milk products.  Wash your hands well after each diarrhea episode.  Only take over-the-counter or prescription medicines as directed by your caregiver.  Take a warm bath to relieve any burning or pain from frequent diarrhea episodes. SEEK IMMEDIATE MEDICAL CARE IF:   You are unable to keep fluids down.  You have persistent vomiting.  You have blood in your stool, or your stools are black and tarry.  You do not urinate in 6-8 hours, or there is only a small amount of very dark urine.  You have abdominal pain that increases or localizes.  You have weakness, dizziness, confusion, or light-headedness.  You have a severe headache.  Your diarrhea gets worse or does not get better.  You have a fever or persistent symptoms for more than 2-3 days.  You have a fever and your symptoms suddenly get worse. MAKE SURE YOU:   Understand these instructions.  Will watch your condition.  Will get help right away if you are not doing well or get worse.   This information is not intended to replace advice given to you by your health care provider. Make sure you discuss any questions you have with your health care provider.   Document Released: 07/23/2002 Document Revised: 08/23/2014 Document Reviewed: 04/09/2012 Elsevier Interactive Patient Education 2016 Elsevier Inc.   Food Choices to Help Relieve Diarrhea, Adult When you have   diarrhea, the foods you eat and your eating habits are very important. Choosing the right foods and drinks can help relieve diarrhea. Also, because diarrhea can last up to 7 days, you need to replace lost fluids and electrolytes (such as sodium, potassium, and chloride) in  order to help prevent dehydration.  WHAT GENERAL GUIDELINES DO I NEED TO FOLLOW?  Slowly drink 1 cup (8 oz) of fluid for each episode of diarrhea. If you are getting enough fluid, your urine will be clear or pale yellow.  Eat starchy foods. Some good choices include white rice, white toast, pasta, low-fiber cereal, baked potatoes (without the skin), saltine crackers, and bagels.  Avoid large servings of any cooked vegetables.  Limit fruit to two servings per day. A serving is  cup or 1 small piece.  Choose foods with less than 2 g of fiber per serving.  Limit fats to less than 8 tsp (38 g) per day.  Avoid fried foods.  Eat foods that have probiotics in them. Probiotics can be found in certain dairy products.  Avoid foods and beverages that may increase the speed at which food moves through the stomach and intestines (gastrointestinal tract). Things to avoid include:  High-fiber foods, such as dried fruit, raw fruits and vegetables, nuts, seeds, and whole grain foods.  Spicy foods and high-fat foods.  Foods and beverages sweetened with high-fructose corn syrup, honey, or sugar alcohols such as xylitol, sorbitol, and mannitol. WHAT FOODS ARE RECOMMENDED? Grains White rice. White, French, or pita breads (fresh or toasted), including plain rolls, buns, or bagels. White pasta. Saltine, soda, or graham crackers. Pretzels. Low-fiber cereal. Cooked cereals made with water (such as cornmeal, farina, or cream cereals). Plain muffins. Matzo. Melba toast. Zwieback.  Vegetables Potatoes (without the skin). Strained tomato and vegetable juices. Most well-cooked and canned vegetables without seeds. Tender lettuce. Fruits Cooked or canned applesauce, apricots, cherries, fruit cocktail, grapefruit, peaches, pears, or plums. Fresh bananas, apples without skin, cherries, grapes, cantaloupe, grapefruit, peaches, oranges, or plums.  Meat and Other Protein Products Baked or boiled chicken. Eggs. Tofu.  Fish. Seafood. Smooth peanut butter. Ground or well-cooked tender beef, ham, veal, lamb, pork, or poultry.  Dairy Plain yogurt, kefir, and unsweetened liquid yogurt. Lactose-free milk, buttermilk, or soy milk. Plain hard cheese. Beverages Sport drinks. Clear broths. Diluted fruit juices (except prune). Regular, caffeine-free sodas such as ginger ale. Water. Decaffeinated teas. Oral rehydration solutions. Sugar-free beverages not sweetened with sugar alcohols. Other Bouillon, broth, or soups made from recommended foods.  The items listed above may not be a complete list of recommended foods or beverages. Contact your dietitian for more options. WHAT FOODS ARE NOT RECOMMENDED? Grains Whole grain, whole wheat, bran, or rye breads, rolls, pastas, crackers, and cereals. Wild or brown rice. Cereals that contain more than 2 g of fiber per serving. Corn tortillas or taco shells. Cooked or dry oatmeal. Granola. Popcorn. Vegetables Raw vegetables. Cabbage, broccoli, Brussels sprouts, artichokes, baked beans, beet greens, corn, kale, legumes, peas, sweet potatoes, and yams. Potato skins. Cooked spinach and cabbage. Fruits Dried fruit, including raisins and dates. Raw fruits. Stewed or dried prunes. Fresh apples with skin, apricots, mangoes, pears, raspberries, and strawberries.  Meat and Other Protein Products Chunky peanut butter. Nuts and seeds. Beans and lentils. Bacon.  Dairy High-fat cheeses. Milk, chocolate milk, and beverages made with milk, such as milk shakes. Cream. Ice cream. Sweets and Desserts Sweet rolls, doughnuts, and sweet breads. Pancakes and waffles. Fats and Oils Butter. Cream sauces.   Margarine. Salad oils. Plain salad dressings. Olives. Avocados.  Beverages Caffeinated beverages (such as coffee, tea, soda, or energy drinks). Alcoholic beverages. Fruit juices with pulp. Prune juice. Soft drinks sweetened with high-fructose corn syrup or sugar alcohols. Other Coconut. Hot sauce.  Chili powder. Mayonnaise. Gravy. Cream-based or milk-based soups.  The items listed above may not be a complete list of foods and beverages to avoid. Contact your dietitian for more information. WHAT SHOULD I DO IF I BECOME DEHYDRATED? Diarrhea can sometimes lead to dehydration. Signs of dehydration include dark urine and dry mouth and skin. If you think you are dehydrated, you should rehydrate with an oral rehydration solution. These solutions can be purchased at pharmacies, retail stores, or online.  Drink -1 cup (120-240 mL) of oral rehydration solution each time you have an episode of diarrhea. If drinking this amount makes your diarrhea worse, try drinking smaller amounts more often. For example, drink 1-3 tsp (5-15 mL) every 5-10 minutes.  A general rule for staying hydrated is to drink 1-2 L of fluid per day. Talk to your health care provider about the specific amount you should be drinking each day. Drink enough fluids to keep your urine clear or pale yellow.   This information is not intended to replace advice given to you by your health care provider. Make sure you discuss any questions you have with your health care provider.   Document Released: 10/23/2003 Document Revised: 08/23/2014 Document Reviewed: 06/25/2013 Elsevier Interactive Patient Education 2016 Elsevier Inc.   

## 2015-07-12 LAB — URINE CULTURE

## 2016-01-22 ENCOUNTER — Other Ambulatory Visit: Payer: Self-pay | Admitting: Family

## 2016-01-22 ENCOUNTER — Other Ambulatory Visit: Payer: Self-pay | Admitting: Licensed Clinical Social Worker

## 2016-01-22 ENCOUNTER — Other Ambulatory Visit: Payer: Self-pay

## 2016-01-22 DIAGNOSIS — Z1231 Encounter for screening mammogram for malignant neoplasm of breast: Secondary | ICD-10-CM

## 2016-02-27 ENCOUNTER — Ambulatory Visit
Admission: RE | Admit: 2016-02-27 | Discharge: 2016-02-27 | Disposition: A | Discharge status: Home / Self Care | Payer: Medicare Other | Source: Ambulatory Visit

## 2016-02-27 DIAGNOSIS — Z1231 Encounter for screening mammogram for malignant neoplasm of breast: Secondary | ICD-10-CM

## 2016-04-09 ENCOUNTER — Other Ambulatory Visit: Payer: Self-pay

## 2016-05-11 ENCOUNTER — Emergency Department (HOSPITAL_COMMUNITY)
Admission: EM | Admit: 2016-05-11 | Discharge: 2016-05-12 | Disposition: A | Discharge status: Home / Self Care | Payer: Medicare Other | Attending: Emergency Medicine | Admitting: Emergency Medicine

## 2016-05-11 ENCOUNTER — Encounter (HOSPITAL_COMMUNITY): Payer: Self-pay

## 2016-05-11 DIAGNOSIS — Z853 Personal history of malignant neoplasm of breast: Secondary | ICD-10-CM | POA: Diagnosis not present

## 2016-05-11 DIAGNOSIS — R531 Weakness: Secondary | ICD-10-CM

## 2016-05-11 DIAGNOSIS — N289 Disorder of kidney and ureter, unspecified: Secondary | ICD-10-CM

## 2016-05-11 DIAGNOSIS — Z79899 Other long term (current) drug therapy: Secondary | ICD-10-CM | POA: Insufficient documentation

## 2016-05-11 DIAGNOSIS — E039 Hypothyroidism, unspecified: Secondary | ICD-10-CM | POA: Insufficient documentation

## 2016-05-11 DIAGNOSIS — Z87891 Personal history of nicotine dependence: Secondary | ICD-10-CM | POA: Insufficient documentation

## 2016-05-11 DIAGNOSIS — I10 Essential (primary) hypertension: Secondary | ICD-10-CM | POA: Insufficient documentation

## 2016-05-11 LAB — CBG MONITORING, ED: Glucose-Capillary: 142 mg/dL — ABNORMAL HIGH (ref 65–99)

## 2016-05-11 NOTE — ED Notes (Signed)
Bed: [id number] Expected date:  Expected time:  Means of arrival:  Comments: 75yo F weakness

## 2016-05-11 NOTE — ED Triage Notes (Signed)
cbg per EMS 142

## 2016-05-11 NOTE — ED Triage Notes (Signed)
Pt was eating dinner about two hours ago ans tried to get up to put her plate up and felt dizzy and weak, the dizziness went away but still felt weak, pt denies chest pain, nausea or vomiting.

## 2016-05-12 LAB — COMPREHENSIVE METABOLIC PANEL
ALBUMIN: 3.4 g/dL — AB (ref 3.5–5.0)
ALT: 17 U/L (ref 14–54)
AST: 19 U/L (ref 15–41)
Alkaline Phosphatase: 77 U/L (ref 38–126)
Anion gap: 10 (ref 5–15)
BILIRUBIN TOTAL: 0.4 mg/dL (ref 0.3–1.2)
BUN: 25 mg/dL — AB (ref 6–20)
CHLORIDE: 106 mmol/L (ref 101–111)
CO2: 23 mmol/L (ref 22–32)
Calcium: 9.3 mg/dL (ref 8.9–10.3)
Creatinine, Ser: 1.63 mg/dL — ABNORMAL HIGH (ref 0.44–1.00)
GFR calc Af Amer: 34 mL/min — ABNORMAL LOW (ref >60)
GFR, EST NON AFRICAN AMERICAN: 30 mL/min — AB (ref >60)
GLUCOSE: 118 mg/dL — AB (ref 65–99)
POTASSIUM: 3.9 mmol/L (ref 3.5–5.1)
Sodium: 139 mmol/L (ref 135–145)
Total Protein: 7.3 g/dL (ref 6.5–8.1)

## 2016-05-12 LAB — CBC WITH DIFFERENTIAL/PLATELET
Basophils Absolute: 0 10*3/uL (ref 0.0–0.1)
Basophils Relative: 0 %
Eosinophils Absolute: 0.1 10*3/uL (ref 0.0–0.7)
Eosinophils Relative: 1 %
HEMATOCRIT: 31.8 % — AB (ref 36.0–46.0)
HEMOGLOBIN: 11.2 g/dL — AB (ref 12.0–15.0)
LYMPHS ABS: 2.1 10*3/uL (ref 0.7–4.0)
LYMPHS PCT: 18 %
MCH: 27.4 pg (ref 26.0–34.0)
MCHC: 35.2 g/dL (ref 30.0–36.0)
MCV: 77.8 fL — AB (ref 78.0–100.0)
Monocytes Absolute: 1.2 10*3/uL — ABNORMAL HIGH (ref 0.1–1.0)
Monocytes Relative: 10 %
NEUTROS PCT: 71 %
Neutro Abs: 8.2 10*3/uL — ABNORMAL HIGH (ref 1.7–7.7)
Platelets: 260 10*3/uL (ref 150–400)
RBC: 4.09 MIL/uL (ref 3.87–5.11)
RDW: 13.4 % (ref 11.5–15.5)
WBC: 11.5 10*3/uL — AB (ref 4.0–10.5)

## 2016-05-12 LAB — URINE MICROSCOPIC-ADD ON
RBC / HPF: NONE SEEN RBC/hpf (ref 0–5)
Squamous Epithelial / LPF: NONE SEEN

## 2016-05-12 LAB — URINALYSIS, ROUTINE W REFLEX MICROSCOPIC
Bilirubin Urine: NEGATIVE
GLUCOSE, UA: NEGATIVE mg/dL
Hgb urine dipstick: NEGATIVE
Ketones, ur: NEGATIVE mg/dL
Nitrite: NEGATIVE
PH: 5 (ref 5.0–8.0)
Protein, ur: NEGATIVE mg/dL
Specific Gravity, Urine: 1.018 (ref 1.005–1.030)

## 2016-05-12 LAB — TROPONIN I: Troponin I: <0.03 ng/mL (ref <0.03)

## 2016-05-12 NOTE — Discharge Instructions (Signed)
Your evaluation today did not show an obvious cause for the weakness. Should problems recur, consider return for further testing. Otherwise, follow-up with your primary care provider.

## 2016-05-12 NOTE — ED Notes (Signed)
[facility] notified for pt transport 

## 2016-05-12 NOTE — ED Provider Notes (Signed)
[facility] DEPT Provider Note   CSN: [id number] Arrival date & time: 05/11/16  2218  By signing my name below, I, Higinio Plan, attest that this documentation has been prepared under the direction and in the presence of Delora Fuel, MD . Electronically Signed: Higinio Plan, Scribe. 05/12/2016. 1:47 AM.  History   Chief Complaint Chief Complaint  Patient presents with  . Weakness   The history is provided by the patient. No language interpreter was used.   HPI Comments: Chelsey Savage is a 75 y.o. female with PMHx of HTN and vertigo, who presents to the Emergency Department complaining of gradually improving, dizziness that began at ~8:00 PM yesterday evening. Pt reports she was finishing dinner when she got up to put away her plate and suddenly felt weak and dizzy like she was going to pass out. She notes this sensation lasted for ~2 hours and states her dizziness was relieved after sitting down for a while after the incident. She reports associated headache but denies chest pain and nausea.    Past Medical History:  Diagnosis Date  . Allergy   . Breast cancer ([place])    right  . Chronic knee pain    right  . Colon polyp   . GERD (gastroesophageal reflux disease)   . Hypertension   . Hypothyroidism   . Liver abscess   . Vertigo     Patient Active Problem List   Diagnosis Date Noted  . Other dysphagia 03/08/2014  . Liver abscess 04/02/2012  . Acute kidney injury ([place]) 03/27/2012  . Hypertension 03/26/2012  . Hypothyroid 03/26/2012  . UTI (lower urinary tract infection) 03/26/2012  . Hypokalemia 03/26/2012  . Cholelithiasis 03/26/2012    Past Surgical History:  Procedure Laterality Date  . HEEL SPUR SURGERY Right   . MASTECTOMY Right 1979  . TONSILLECTOMY      OB History    No data available       Home Medications    Prior to Admission medications   Medication Sig Start Date End Date Taking? Authorizing Provider  acetaminophen (TYLENOL) 650 MG CR tablet  Take 1,300 mg by mouth daily as needed for pain.   Yes Historical Provider, MD  Calcium Carbonate-Vitamin D (CALTRATE 600+D) 600-400 MG-UNIT per chew tablet Chew 1 tablet by mouth 2 (two) times daily with a meal.   Yes Historical Provider, MD  cetirizine (ZYRTEC) 10 MG tablet Take 10 mg by mouth daily.   Yes Historical Provider, MD  levothyroxine (SYNTHROID, LEVOTHROID) 100 MCG tablet Take 100 mcg by mouth daily before breakfast.   Yes Historical Provider, MD  multivitamin-lutein (OCUVITE-LUTEIN) CAPS capsule Take 1 capsule by mouth daily.   Yes Historical Provider, MD  potassium chloride (MICRO-K) 10 MEQ CR capsule Take 10 mEq by mouth 2 (two) times daily.   Yes Historical Provider, MD  valsartan-hydrochlorothiazide (DIOVAN-HCT) 160-25 MG per tablet Take 1 tablet by mouth daily.  01/03/14  Yes Historical Provider, MD  fluticasone (FLONASE) 50 MCG/ACT nasal spray Place 2 sprays into both nostrils daily. 09/21/14 09/28/14  Pamella Pert, MD  meclizine (ANTIVERT) 12.5 MG tablet Take 1 tablet (12.5 mg total) by mouth 3 (three) times daily as needed for dizziness. Patient not taking: Reported on 05/11/2016 02/03/14   Carman Ching, PA-C  omeprazole (PRILOSEC) 20 MG capsule Take 1 capsule (20 mg total) by mouth daily. Patient not taking: Reported on 05/11/2016 03/02/14   Carman Ching, PA-C    Family History Family History  Problem Relation Age  of Onset  . Anuerysm Mother     type unknown  . Diabetes Son     x 2    Social History Social History  Substance Use Topics  . Smoking status: Former Smoker    Types: Cigarettes    Quit date: 08/16/1997  . Smokeless tobacco: Never Used  . Alcohol use No     Allergies   Review of patient's allergies indicates no known allergies.   Review of Systems Review of Systems  Cardiovascular: Negative for chest pain.  Gastrointestinal: Negative for nausea.  Neurological: Positive for dizziness, weakness and headaches.   Physical Exam Updated Vital  Signs BP 127/60 (BP Location: Right Arm)   Pulse 60   Temp 99 F (37.2 C) (Oral)   Resp 23   SpO2 100%   Physical Exam  Constitutional: She is oriented to person, place, and time. She appears well-developed and well-nourished.  HENT:  Head: Normocephalic and atraumatic.  Eyes: EOM are normal. Pupils are equal, round, and reactive to light.  Neck: Normal range of motion. Neck supple. No JVD present.  Cardiovascular: Normal rate, regular rhythm and normal heart sounds.   No murmur heard. Pulmonary/Chest: Effort normal and breath sounds normal. She has no wheezes. She has no rales. She exhibits no tenderness.  Abdominal: Soft. Bowel sounds are normal. She exhibits no distension and no mass. There is no tenderness.  Musculoskeletal: Normal range of motion. She exhibits no edema.  Lymphadenopathy:    She has no cervical adenopathy.  Neurological: She is alert and oriented to person, place, and time. No cranial nerve deficit. She exhibits normal muscle tone. Coordination normal.  Skin: Skin is warm and dry. No rash noted.  Psychiatric: She has a normal mood and affect. Her behavior is normal. Judgment and thought content normal.  Nursing note and vitals reviewed.  ED Treatments / Results  Labs (all labs ordered are listed, but only abnormal results are displayed) Labs Reviewed  COMPREHENSIVE METABOLIC PANEL - Abnormal; Notable for the following:       Result Value   Glucose, Bld 118 (*)    BUN 25 (*)    Creatinine, Ser 1.63 (*)    Albumin 3.4 (*)    GFR calc non Af Amer 30 (*)    GFR calc Af Amer 34 (*)    All other components within normal limits  CBC WITH DIFFERENTIAL/PLATELET - Abnormal; Notable for the following:    WBC 11.5 (*)    Hemoglobin 11.2 (*)    HCT 31.8 (*)    MCV 77.8 (*)    Neutro Abs 8.2 (*)    Monocytes Absolute 1.2 (*)    All other components within normal limits  URINALYSIS, ROUTINE W REFLEX MICROSCOPIC (NOT AT [facility]) - Abnormal; Notable for the following:     APPearance CLOUDY (*)    Leukocytes, UA LARGE (*)    All other components within normal limits  URINE MICROSCOPIC-ADD ON - Abnormal; Notable for the following:    Bacteria, UA FEW (*)    All other components within normal limits  CBG MONITORING, ED - Abnormal; Notable for the following:    Glucose-Capillary 142 (*)    All other components within normal limits  TROPONIN I    EKG  EKG Interpretation  Date/Time:  Wednesday May 12 2016 00:45:32 EDT Ventricular Rate:  56 PR Interval:    QRS Duration: 94 QT Interval:  474 QTC Calculation: 458 R Axis:   4 Text Interpretation:  Sinus  rhythm Abnormal R-wave progression, early transition When compared with ECG of 03/02/2014, No significant change was found Confirmed by [facility]  MD, Avontae Burkhead ([id number]) on 05/12/2016 2:12:15 AM      Procedures Procedures (including critical care time)  Medications Ordered in ED Medications - No data to display  DIAGNOSTIC STUDIES:  Oxygen Saturation is 100% on RA, normal by my interpretation.    COORDINATION OF CARE:  1:46 AM Discussed treatment plan with pt at bedside and pt agreed to plan.  Initial Impression / Assessment and Plan / ED Course  I have reviewed the triage vital signs and the nursing notes.  Pertinent labs & imaging results that were available during my care of the patient were reviewed by me and considered in my medical decision making (see chart for details).  Clinical Course   Transient weakness of uncertain cause. Patient states she is symptom-free currently. ED evaluation is ordered including ECG, metabolic panel, urinalysis, orthostatic vital signs. Old records are reviewed showing no relevant past visits. ED evaluation shows mild anemia and mild renal insufficiency which are unchanged from baseline. With static vital signs are unremarkable. No evidence of urinary tract infection. ECG is unremarkable and troponin is normal. Patient continues to be pain-free in the ED. She is  sad discharged with instructions to follow-up with PCP.  I personally performed the services described in this documentation, which was scribed in my presence. The recorded information has been reviewed and is accurate.   Final Clinical Impressions(s) / ED Diagnoses   Final diagnoses:  Weakness  Renal insufficiency    New Prescriptions New Prescriptions   No medications on file     Delora Fuel, MD [id number]

## 2016-10-11 ENCOUNTER — Other Ambulatory Visit: Payer: Self-pay | Admitting: Nephrology

## 2016-10-11 DIAGNOSIS — N183 Chronic kidney disease, stage 3 unspecified: Secondary | ICD-10-CM

## 2016-10-15 ENCOUNTER — Ambulatory Visit
Admission: RE | Admit: 2016-10-15 | Discharge: 2016-10-15 | Disposition: A | Discharge status: Home / Self Care | Payer: Medicare Other | Source: Ambulatory Visit | Attending: Nephrology | Admitting: Nephrology

## 2016-10-15 DIAGNOSIS — N183 Chronic kidney disease, stage 3 unspecified: Secondary | ICD-10-CM

## 2017-01-20 ENCOUNTER — Other Ambulatory Visit: Payer: Self-pay | Admitting: Nurse Practitioner

## 2017-01-20 DIAGNOSIS — Z1231 Encounter for screening mammogram for malignant neoplasm of breast: Secondary | ICD-10-CM

## 2017-01-20 DIAGNOSIS — Z9011 Acquired absence of right breast and nipple: Secondary | ICD-10-CM

## 2017-02-28 ENCOUNTER — Other Ambulatory Visit: Payer: Self-pay | Admitting: Nurse Practitioner

## 2017-02-28 ENCOUNTER — Ambulatory Visit
Admission: RE | Admit: 2017-02-28 | Discharge: 2017-02-28 | Disposition: A | Discharge status: Home / Self Care | Payer: Medicare Other | Source: Ambulatory Visit | Attending: Nurse Practitioner | Admitting: Nurse Practitioner

## 2017-02-28 DIAGNOSIS — Z1231 Encounter for screening mammogram for malignant neoplasm of breast: Secondary | ICD-10-CM

## 2017-02-28 DIAGNOSIS — Z9011 Acquired absence of right breast and nipple: Secondary | ICD-10-CM

## 2017-07-21 ENCOUNTER — Other Ambulatory Visit: Payer: Self-pay

## 2017-07-27 ENCOUNTER — Other Ambulatory Visit: Payer: Self-pay | Admitting: Internal Medicine

## 2017-07-27 ENCOUNTER — Other Ambulatory Visit: Payer: Self-pay | Admitting: Nurse Practitioner

## 2017-07-27 DIAGNOSIS — E2839 Other primary ovarian failure: Secondary | ICD-10-CM

## 2017-08-12 ENCOUNTER — Ambulatory Visit
Admission: RE | Admit: 2017-08-12 | Discharge: 2017-08-12 | Disposition: A | Discharge status: Home / Self Care | Payer: Medicare Other | Source: Ambulatory Visit | Attending: Nurse Practitioner | Admitting: Nurse Practitioner

## 2017-08-12 DIAGNOSIS — E2839 Other primary ovarian failure: Secondary | ICD-10-CM

## 2017-08-30 ENCOUNTER — Emergency Department (HOSPITAL_COMMUNITY)
Admission: EM | Admit: 2017-08-30 | Discharge: 2017-08-30 | Disposition: A | Discharge status: Home / Self Care | Payer: Medicare Other | Attending: Emergency Medicine | Admitting: Emergency Medicine

## 2017-08-30 ENCOUNTER — Encounter (HOSPITAL_COMMUNITY): Payer: Self-pay | Admitting: Emergency Medicine

## 2017-08-30 DIAGNOSIS — I10 Essential (primary) hypertension: Secondary | ICD-10-CM | POA: Diagnosis not present

## 2017-08-30 DIAGNOSIS — M25512 Pain in left shoulder: Secondary | ICD-10-CM | POA: Diagnosis not present

## 2017-08-30 DIAGNOSIS — Z79899 Other long term (current) drug therapy: Secondary | ICD-10-CM | POA: Diagnosis not present

## 2017-08-30 DIAGNOSIS — E039 Hypothyroidism, unspecified: Secondary | ICD-10-CM | POA: Insufficient documentation

## 2017-08-30 DIAGNOSIS — Z853 Personal history of malignant neoplasm of breast: Secondary | ICD-10-CM | POA: Insufficient documentation

## 2017-08-30 DIAGNOSIS — R1310 Dysphagia, unspecified: Secondary | ICD-10-CM | POA: Diagnosis not present

## 2017-08-30 DIAGNOSIS — M25511 Pain in right shoulder: Secondary | ICD-10-CM | POA: Diagnosis not present

## 2017-08-30 DIAGNOSIS — Z87891 Personal history of nicotine dependence: Secondary | ICD-10-CM | POA: Diagnosis not present

## 2017-08-30 DIAGNOSIS — K219 Gastro-esophageal reflux disease without esophagitis: Secondary | ICD-10-CM | POA: Diagnosis present

## 2017-08-30 MED ORDER — OMEPRAZOLE 20 MG PO CPDR: 20.0000 mg | DELAYED_RELEASE_CAPSULE | Freq: Every day | ORAL | 0 refills | Status: AC

## 2017-08-30 NOTE — ED Notes (Signed)
Pt has stated a mastectomy on right side. Blood pressure was taken on the left arm.

## 2017-08-30 NOTE — ED Provider Notes (Addendum)
[facility] DEPT Provider Note   CSN: [id number] Arrival date & time: 08/30/17  1320     History   Chief Complaint Chief Complaint  Patient presents with  . Shoulder Pain  . Gastroesophageal Reflux    HPI Chelsey Savage is a 77 y.o. female.  Pt presents to the ED today primarily because of food occasionally getting stuck in her esophagus.  The pt has had this problem in the past and has seen GI.  Unfortunately, she did not know who she saw and was afraid to take any meds for it.  According to old chart, pt was seen in the past by [facility].  She was put on prilosec (which pt has not taken in a few years).  They did a barium swallow which showed dysmotility, not a stricture and did not think stretching her esophagus would be beneficial, but recommended an EGD which looks like pt never received.  It looks like pt missed her appt and never rescheduled.  Pt said it only happens when she eats a whole grain sandwich which will sometimes get stuck and she will have to make herself throw up.  She said she is able to swallow fine today.  She was also c/o bilateral shoulder pain which she thinks is from having to sleep on a chair due to her heat being out.  Her furnace was fixed yesterday and she was able to sleep in bed.  That feels fine now and she is in no pain.      Past Medical History:  Diagnosis Date  . Allergy   . Breast cancer ([place])    right  . Chronic knee pain    right  . Colon polyp   . GERD (gastroesophageal reflux disease)   . Hypertension   . Hypothyroidism   . Liver abscess   . Vertigo     Patient Active Problem List   Diagnosis Date Noted  . Other dysphagia 03/08/2014  . Liver abscess 04/02/2012  . Acute kidney injury ([place]) 03/27/2012  . Hypertension 03/26/2012  . Hypothyroid 03/26/2012  . UTI (lower urinary tract infection) 03/26/2012  . Hypokalemia 03/26/2012  . Cholelithiasis 03/26/2012    Past Surgical History:    Procedure Laterality Date  . HEEL SPUR SURGERY Right   . MASTECTOMY Right 1979  . TONSILLECTOMY      OB History    No data available       Home Medications    Prior to Admission medications   Medication Sig Start Date End Date Taking? Authorizing Provider  acetaminophen (TYLENOL) 650 MG CR tablet Take 1,300 mg by mouth daily as needed for pain.   Yes [provider]  Calcium Carbonate-Vitamin D (CALTRATE 600+D) 600-400 MG-UNIT per chew tablet Chew 1 tablet by mouth 2 (two) times daily with a meal.   Yes [provider]  cetirizine (ZYRTEC) 10 MG tablet Take 10 mg by mouth daily.   Yes [provider]  levothyroxine (SYNTHROID, LEVOTHROID) 100 MCG tablet Take 100 mcg by mouth daily before breakfast.   Yes [provider]  multivitamin-lutein (OCUVITE-LUTEIN) CAPS capsule Take 1 capsule by mouth daily.   Yes [provider]  potassium chloride (MICRO-K) 10 MEQ CR capsule Take 10 mEq by mouth 2 (two) times daily.   Yes [provider]  sodium chloride (OCEAN) 0.65 % SOLN nasal spray Place 2 sprays into both nostrils as needed for congestion.   Yes [provider]  valsartan-hydrochlorothiazide (  DIOVAN-HCT) 160-25 MG per tablet Take 1 tablet by mouth daily.  01/03/14  Yes [provider]  omeprazole (PRILOSEC) 20 MG capsule Take 1 capsule (20 mg total) by mouth daily. 08/30/17   Isla Pence, MD    Family History Family History  Problem Relation Age of Onset  . Anuerysm Mother        type unknown  . Diabetes Son        x 2    Social History Social History   Tobacco Use  . Smoking status: Former Smoker    Types: Cigarettes    Last attempt to quit: 08/16/1997    Years since quitting: 20.0  . Smokeless tobacco: Never Used  Substance Use Topics  . Alcohol use: No  . Drug use: No     Allergies   Patient has no known allergies.   Review of Systems Review of Systems  Gastrointestinal:        Difficulty swallowing  All other systems reviewed and are negative.    Physical Exam Updated Vital Signs BP (!) 119/44 (BP Location: Left Arm)   Pulse 74   Temp 97.6 F (36.4 C) (Oral)   Resp 16   SpO2 96%   Physical Exam  Constitutional: She is oriented to person, place, and time. She appears well-developed and well-nourished.  HENT:  Head: Normocephalic and atraumatic.  Right Ear: External ear normal.  Left Ear: External ear normal.  Nose: Nose normal.  Mouth/Throat: Oropharynx is clear and moist.  Eyes: Conjunctivae and EOM are normal. Pupils are equal, round, and reactive to light.  Neck: Normal range of motion. Neck supple.  Cardiovascular: Normal rate, regular rhythm, normal heart sounds and intact distal pulses.  Pulmonary/Chest: Effort normal and breath sounds normal.  Abdominal: Soft. Bowel sounds are normal.  Musculoskeletal: Normal range of motion.  Neurological: She is alert and oriented to person, place, and time.  Skin: Skin is warm. Capillary refill takes less than 2 seconds.  Psychiatric: She has a normal mood and affect. Her behavior is normal. Judgment and thought content normal.  Nursing note and vitals reviewed.    ED Treatments / Results  Labs (all labs ordered are listed, but only abnormal results are displayed) Labs Reviewed - No data to display  EKG  EKG Interpretation None       Radiology No results found.  Procedures Procedures (including critical care time)  Medications Ordered in ED Medications - No data to display   Initial Impression / Assessment and Plan / ED Course  I have reviewed the triage vital signs and the nursing notes.  Pertinent labs & imaging results that were available during my care of the patient were reviewed by me and considered in my medical decision making (see chart for details).  Pt will be started back on prilosec as she said it worked for her in the past.  She is encouraged to f/u with [facility].   Return if worse.  Final Clinical Impressions(s) / ED Diagnoses   Final diagnoses:  Dysphagia, unspecified type    ED Discharge Orders        Ordered    omeprazole (PRILOSEC) 20 MG capsule  Daily     08/30/17 1941       Isla Pence, MD 08/30/17 Marjo Bicker    Isla Pence, MD 08/30/17 1949

## 2017-08-30 NOTE — ED Triage Notes (Signed)
Patient presents with multiple complaints, first she is having chronic acid reflux issues worsening over the past week with swallowing problems. Pt adds about 2-3 days ago worsening bilateral shoulder pain from her arthritis, denies any injury. Pt denies any chest pain or shortness of breathe. Pt ambulatory with cane. Pt reports taking tylenol at home that relieved shoulder pain.

## 2017-08-30 NOTE — ED Provider Notes (Signed)
Patient placed in Quick Look pathway, seen and evaluated   Chief Complaint: acid reflux; bilateral shoulder pain  HPI:   Reports worsening of acid reflux for the past week. No longer of antacid medications. Feels like she needs to belch after eating but sometimes unable to. Reports relief with burping. Acid feeling in throat. Worse with eating certain foods. Also reports bilateral shoulder pain for the past 2-3 days. Describes pain as soreness; better with Tylenol. 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  Denies chest pain, shortness of breath, hemoptysis, URI symptoms, vomiting, dyspnea on exertion or any exertional components. Denies prior cardiac history.  ROS: reflux, bilateral shoulder pain  Physical Exam:   Gen: No distress  Neuro: Awake and Alert  Skin: Warm    Focused Exam: lungs CTAB, RRR, no abdominal TTP, no chest TTP. Appears overall well with no difficulty breathing or speaking. No TTP of shoulders at this time because states she took Tylenol PTA with resolution of her symptoms.   Initiation of care has begun. The patient has been counseled on the process, plan, and necessity for staying for the completion/evaluation, and the remainder of the medical screening examination   Delia Heady, PA-C 08/30/17 1355    Virgel Manifold, MD 08/30/17 [phone]

## 2017-09-08 ENCOUNTER — Inpatient Hospital Stay (HOSPITAL_COMMUNITY)
Admission: EM | Admit: 2017-09-08 | Discharge: 2017-10-14 | DRG: 987 | Disposition: E | Payer: Medicare Other | Attending: Internal Medicine | Admitting: Internal Medicine

## 2017-09-08 ENCOUNTER — Encounter (HOSPITAL_COMMUNITY): Payer: Self-pay | Admitting: Emergency Medicine

## 2017-09-08 ENCOUNTER — Other Ambulatory Visit: Payer: Self-pay

## 2017-09-08 DIAGNOSIS — E669 Obesity, unspecified: Secondary | ICD-10-CM | POA: Diagnosis present

## 2017-09-08 DIAGNOSIS — I248 Other forms of acute ischemic heart disease: Secondary | ICD-10-CM | POA: Diagnosis present

## 2017-09-08 DIAGNOSIS — Z79890 Hormone replacement therapy (postmenopausal): Secondary | ICD-10-CM

## 2017-09-08 DIAGNOSIS — N179 Acute kidney failure, unspecified: Secondary | ICD-10-CM | POA: Diagnosis present

## 2017-09-08 DIAGNOSIS — K661 Hemoperitoneum: Secondary | ICD-10-CM | POA: Diagnosis not present

## 2017-09-08 DIAGNOSIS — E8770 Fluid overload, unspecified: Secondary | ICD-10-CM | POA: Diagnosis not present

## 2017-09-08 DIAGNOSIS — R739 Hyperglycemia, unspecified: Secondary | ICD-10-CM | POA: Diagnosis not present

## 2017-09-08 DIAGNOSIS — R7989 Other specified abnormal findings of blood chemistry: Secondary | ICD-10-CM

## 2017-09-08 DIAGNOSIS — R1310 Dysphagia, unspecified: Secondary | ICD-10-CM

## 2017-09-08 DIAGNOSIS — C23 Malignant neoplasm of gallbladder: Secondary | ICD-10-CM | POA: Diagnosis not present

## 2017-09-08 DIAGNOSIS — Z833 Family history of diabetes mellitus: Secondary | ICD-10-CM

## 2017-09-08 DIAGNOSIS — T8119XA Other postprocedural shock, initial encounter: Secondary | ICD-10-CM | POA: Diagnosis not present

## 2017-09-08 DIAGNOSIS — K8021 Calculus of gallbladder without cholecystitis with obstruction: Secondary | ICD-10-CM | POA: Diagnosis present

## 2017-09-08 DIAGNOSIS — R17 Unspecified jaundice: Secondary | ICD-10-CM | POA: Diagnosis present

## 2017-09-08 DIAGNOSIS — K828 Other specified diseases of gallbladder: Secondary | ICD-10-CM

## 2017-09-08 DIAGNOSIS — K831 Obstruction of bile duct: Secondary | ICD-10-CM

## 2017-09-08 DIAGNOSIS — R0603 Acute respiratory distress: Secondary | ICD-10-CM

## 2017-09-08 DIAGNOSIS — Z0189 Encounter for other specified special examinations: Secondary | ICD-10-CM

## 2017-09-08 DIAGNOSIS — J9600 Acute respiratory failure, unspecified whether with hypoxia or hypercapnia: Secondary | ICD-10-CM

## 2017-09-08 DIAGNOSIS — E872 Acidosis, unspecified: Secondary | ICD-10-CM

## 2017-09-08 DIAGNOSIS — R945 Abnormal results of liver function studies: Secondary | ICD-10-CM

## 2017-09-08 DIAGNOSIS — Z452 Encounter for adjustment and management of vascular access device: Secondary | ICD-10-CM

## 2017-09-08 DIAGNOSIS — J9601 Acute respiratory failure with hypoxia: Secondary | ICD-10-CM | POA: Diagnosis not present

## 2017-09-08 DIAGNOSIS — D62 Acute posthemorrhagic anemia: Secondary | ICD-10-CM | POA: Diagnosis not present

## 2017-09-08 DIAGNOSIS — E860 Dehydration: Secondary | ICD-10-CM | POA: Diagnosis present

## 2017-09-08 DIAGNOSIS — N184 Chronic kidney disease, stage 4 (severe): Secondary | ICD-10-CM | POA: Diagnosis present

## 2017-09-08 DIAGNOSIS — K219 Gastro-esophageal reflux disease without esophagitis: Secondary | ICD-10-CM | POA: Diagnosis present

## 2017-09-08 DIAGNOSIS — D689 Coagulation defect, unspecified: Secondary | ICD-10-CM | POA: Diagnosis present

## 2017-09-08 DIAGNOSIS — N170 Acute kidney failure with tubular necrosis: Secondary | ICD-10-CM | POA: Diagnosis present

## 2017-09-08 DIAGNOSIS — Z6841 Body Mass Index (BMI) 40.0 and over, adult: Secondary | ICD-10-CM

## 2017-09-08 DIAGNOSIS — H9190 Unspecified hearing loss, unspecified ear: Secondary | ICD-10-CM | POA: Diagnosis present

## 2017-09-08 DIAGNOSIS — R160 Hepatomegaly, not elsewhere classified: Secondary | ICD-10-CM

## 2017-09-08 DIAGNOSIS — R609 Edema, unspecified: Secondary | ICD-10-CM

## 2017-09-08 DIAGNOSIS — M25561 Pain in right knee: Secondary | ICD-10-CM | POA: Diagnosis present

## 2017-09-08 DIAGNOSIS — Z9011 Acquired absence of right breast and nipple: Secondary | ICD-10-CM

## 2017-09-08 DIAGNOSIS — C221 Intrahepatic bile duct carcinoma: Secondary | ICD-10-CM | POA: Diagnosis present

## 2017-09-08 DIAGNOSIS — I129 Hypertensive chronic kidney disease with stage 1 through stage 4 chronic kidney disease, or unspecified chronic kidney disease: Secondary | ICD-10-CM | POA: Diagnosis present

## 2017-09-08 DIAGNOSIS — I10 Essential (primary) hypertension: Secondary | ICD-10-CM | POA: Diagnosis present

## 2017-09-08 DIAGNOSIS — Z853 Personal history of malignant neoplasm of breast: Secondary | ICD-10-CM

## 2017-09-08 DIAGNOSIS — D509 Iron deficiency anemia, unspecified: Secondary | ICD-10-CM | POA: Diagnosis present

## 2017-09-08 DIAGNOSIS — Z978 Presence of other specified devices: Secondary | ICD-10-CM

## 2017-09-08 DIAGNOSIS — R578 Other shock: Secondary | ICD-10-CM

## 2017-09-08 DIAGNOSIS — K922 Gastrointestinal hemorrhage, unspecified: Secondary | ICD-10-CM

## 2017-09-08 DIAGNOSIS — E039 Hypothyroidism, unspecified: Secondary | ICD-10-CM | POA: Diagnosis present

## 2017-09-08 DIAGNOSIS — K7200 Acute and subacute hepatic failure without coma: Secondary | ICD-10-CM | POA: Diagnosis not present

## 2017-09-08 DIAGNOSIS — K750 Abscess of liver: Secondary | ICD-10-CM | POA: Diagnosis present

## 2017-09-08 DIAGNOSIS — I9581 Postprocedural hypotension: Secondary | ICD-10-CM | POA: Diagnosis not present

## 2017-09-08 DIAGNOSIS — H9319 Tinnitus, unspecified ear: Secondary | ICD-10-CM | POA: Diagnosis present

## 2017-09-08 DIAGNOSIS — G8929 Other chronic pain: Secondary | ICD-10-CM | POA: Diagnosis present

## 2017-09-08 DIAGNOSIS — Z66 Do not resuscitate: Secondary | ICD-10-CM | POA: Diagnosis not present

## 2017-09-08 DIAGNOSIS — T161XXA Foreign body in right ear, initial encounter: Secondary | ICD-10-CM

## 2017-09-08 DIAGNOSIS — Z87891 Personal history of nicotine dependence: Secondary | ICD-10-CM

## 2017-09-08 DIAGNOSIS — R0600 Dyspnea, unspecified: Secondary | ICD-10-CM

## 2017-09-08 DIAGNOSIS — K91840 Postprocedural hemorrhage and hematoma of a digestive system organ or structure following a digestive system procedure: Secondary | ICD-10-CM | POA: Diagnosis not present

## 2017-09-08 DIAGNOSIS — Z86010 Personal history of colonic polyps: Secondary | ICD-10-CM

## 2017-09-08 DIAGNOSIS — Y848 Other medical procedures as the cause of abnormal reaction of the patient, or of later complication, without mention of misadventure at the time of the procedure: Secondary | ICD-10-CM | POA: Diagnosis not present

## 2017-09-08 DIAGNOSIS — N189 Chronic kidney disease, unspecified: Secondary | ICD-10-CM

## 2017-09-08 DIAGNOSIS — E874 Mixed disorder of acid-base balance: Secondary | ICD-10-CM | POA: Diagnosis not present

## 2017-09-08 DIAGNOSIS — A419 Sepsis, unspecified organism: Secondary | ICD-10-CM | POA: Diagnosis not present

## 2017-09-08 DIAGNOSIS — Z886 Allergy status to analgesic agent status: Secondary | ICD-10-CM

## 2017-09-08 DIAGNOSIS — R579 Shock, unspecified: Secondary | ICD-10-CM

## 2017-09-08 NOTE — ED Triage Notes (Signed)
Pt states she has decreased energy and decreased appetite for the past week  States today she has a crunching noise in her right ear   Pt was seen here last Tuesday for difficulty eating but states she has been taking her medicine and it has gotten better

## 2017-09-09 ENCOUNTER — Encounter (HOSPITAL_COMMUNITY): Payer: Self-pay | Admitting: Internal Medicine

## 2017-09-09 ENCOUNTER — Inpatient Hospital Stay (HOSPITAL_COMMUNITY): Payer: Medicare Other

## 2017-09-09 ENCOUNTER — Emergency Department (HOSPITAL_COMMUNITY): Payer: Medicare Other

## 2017-09-09 DIAGNOSIS — E874 Mixed disorder of acid-base balance: Secondary | ICD-10-CM | POA: Diagnosis not present

## 2017-09-09 DIAGNOSIS — R195 Other fecal abnormalities: Secondary | ICD-10-CM | POA: Diagnosis not present

## 2017-09-09 DIAGNOSIS — M25561 Pain in right knee: Secondary | ICD-10-CM | POA: Diagnosis present

## 2017-09-09 DIAGNOSIS — D509 Iron deficiency anemia, unspecified: Secondary | ICD-10-CM | POA: Diagnosis not present

## 2017-09-09 DIAGNOSIS — R630 Anorexia: Secondary | ICD-10-CM | POA: Diagnosis not present

## 2017-09-09 DIAGNOSIS — D62 Acute posthemorrhagic anemia: Secondary | ICD-10-CM | POA: Diagnosis not present

## 2017-09-09 DIAGNOSIS — R17 Unspecified jaundice: Secondary | ICD-10-CM | POA: Diagnosis present

## 2017-09-09 DIAGNOSIS — R531 Weakness: Secondary | ICD-10-CM | POA: Diagnosis not present

## 2017-09-09 DIAGNOSIS — Z86010 Personal history of colonic polyps: Secondary | ICD-10-CM | POA: Diagnosis not present

## 2017-09-09 DIAGNOSIS — G8929 Other chronic pain: Secondary | ICD-10-CM | POA: Diagnosis present

## 2017-09-09 DIAGNOSIS — D689 Coagulation defect, unspecified: Secondary | ICD-10-CM | POA: Diagnosis present

## 2017-09-09 DIAGNOSIS — A419 Sepsis, unspecified organism: Secondary | ICD-10-CM | POA: Diagnosis not present

## 2017-09-09 DIAGNOSIS — R160 Hepatomegaly, not elsewhere classified: Secondary | ICD-10-CM | POA: Diagnosis not present

## 2017-09-09 DIAGNOSIS — N170 Acute kidney failure with tubular necrosis: Secondary | ICD-10-CM | POA: Diagnosis present

## 2017-09-09 DIAGNOSIS — T8119XA Other postprocedural shock, initial encounter: Secondary | ICD-10-CM | POA: Diagnosis not present

## 2017-09-09 DIAGNOSIS — E872 Acidosis: Secondary | ICD-10-CM | POA: Diagnosis not present

## 2017-09-09 DIAGNOSIS — K838 Other specified diseases of biliary tract: Secondary | ICD-10-CM

## 2017-09-09 DIAGNOSIS — K8021 Calculus of gallbladder without cholecystitis with obstruction: Secondary | ICD-10-CM | POA: Diagnosis present

## 2017-09-09 DIAGNOSIS — R935 Abnormal findings on diagnostic imaging of other abdominal regions, including retroperitoneum: Secondary | ICD-10-CM | POA: Diagnosis not present

## 2017-09-09 DIAGNOSIS — K219 Gastro-esophageal reflux disease without esophagitis: Secondary | ICD-10-CM | POA: Diagnosis present

## 2017-09-09 DIAGNOSIS — N189 Chronic kidney disease, unspecified: Secondary | ICD-10-CM | POA: Diagnosis not present

## 2017-09-09 DIAGNOSIS — K7200 Acute and subacute hepatic failure without coma: Secondary | ICD-10-CM | POA: Diagnosis not present

## 2017-09-09 DIAGNOSIS — K91840 Postprocedural hemorrhage and hematoma of a digestive system organ or structure following a digestive system procedure: Secondary | ICD-10-CM | POA: Diagnosis not present

## 2017-09-09 DIAGNOSIS — T161XXA Foreign body in right ear, initial encounter: Secondary | ICD-10-CM | POA: Diagnosis not present

## 2017-09-09 DIAGNOSIS — Z6841 Body Mass Index (BMI) 40.0 and over, adult: Secondary | ICD-10-CM | POA: Diagnosis not present

## 2017-09-09 DIAGNOSIS — R578 Other shock: Secondary | ICD-10-CM

## 2017-09-09 DIAGNOSIS — Z853 Personal history of malignant neoplasm of breast: Secondary | ICD-10-CM | POA: Diagnosis not present

## 2017-09-09 DIAGNOSIS — Y848 Other medical procedures as the cause of abnormal reaction of the patient, or of later complication, without mention of misadventure at the time of the procedure: Secondary | ICD-10-CM | POA: Diagnosis not present

## 2017-09-09 DIAGNOSIS — I129 Hypertensive chronic kidney disease with stage 1 through stage 4 chronic kidney disease, or unspecified chronic kidney disease: Secondary | ICD-10-CM | POA: Diagnosis present

## 2017-09-09 DIAGNOSIS — K8050 Calculus of bile duct without cholangitis or cholecystitis without obstruction: Secondary | ICD-10-CM | POA: Diagnosis not present

## 2017-09-09 DIAGNOSIS — R579 Shock, unspecified: Secondary | ICD-10-CM | POA: Diagnosis not present

## 2017-09-09 DIAGNOSIS — E039 Hypothyroidism, unspecified: Secondary | ICD-10-CM | POA: Diagnosis present

## 2017-09-09 DIAGNOSIS — N184 Chronic kidney disease, stage 4 (severe): Secondary | ICD-10-CM | POA: Diagnosis present

## 2017-09-09 DIAGNOSIS — H9311 Tinnitus, right ear: Secondary | ICD-10-CM

## 2017-09-09 DIAGNOSIS — C23 Malignant neoplasm of gallbladder: Secondary | ICD-10-CM | POA: Diagnosis present

## 2017-09-09 DIAGNOSIS — K828 Other specified diseases of gallbladder: Secondary | ICD-10-CM | POA: Diagnosis not present

## 2017-09-09 DIAGNOSIS — I248 Other forms of acute ischemic heart disease: Secondary | ICD-10-CM | POA: Diagnosis present

## 2017-09-09 DIAGNOSIS — C221 Intrahepatic bile duct carcinoma: Secondary | ICD-10-CM | POA: Diagnosis present

## 2017-09-09 DIAGNOSIS — J9600 Acute respiratory failure, unspecified whether with hypoxia or hypercapnia: Secondary | ICD-10-CM | POA: Diagnosis not present

## 2017-09-09 DIAGNOSIS — K750 Abscess of liver: Secondary | ICD-10-CM | POA: Diagnosis present

## 2017-09-09 DIAGNOSIS — K661 Hemoperitoneum: Secondary | ICD-10-CM | POA: Diagnosis not present

## 2017-09-09 DIAGNOSIS — R945 Abnormal results of liver function studies: Secondary | ICD-10-CM | POA: Diagnosis not present

## 2017-09-09 DIAGNOSIS — N179 Acute kidney failure, unspecified: Secondary | ICD-10-CM | POA: Diagnosis not present

## 2017-09-09 DIAGNOSIS — J9601 Acute respiratory failure with hypoxia: Secondary | ICD-10-CM | POA: Diagnosis not present

## 2017-09-09 LAB — COMPREHENSIVE METABOLIC PANEL
ALK PHOS: 299 U/L — AB (ref 38–126)
ALT: 129 U/L — AB (ref 14–54)
ALT: 108 U/L — AB (ref 14–54)
AST: 116 U/L — ABNORMAL HIGH (ref 15–41)
AST: 92 U/L — AB (ref 15–41)
Albumin: 2.6 g/dL — ABNORMAL LOW (ref 3.5–5.0)
Albumin: 2.4 g/dL — ABNORMAL LOW (ref 3.5–5.0)
Alkaline Phosphatase: 282 U/L — ABNORMAL HIGH (ref 38–126)
Anion gap: 12 (ref 5–15)
Anion gap: 10 (ref 5–15)
BUN: 48 mg/dL — ABNORMAL HIGH (ref 6–20)
BUN: 44 mg/dL — AB (ref 6–20)
CHLORIDE: 115 mmol/L — AB (ref 101–111)
CO2: 19 mmol/L — AB (ref 22–32)
CO2: 18 mmol/L — AB (ref 22–32)
CREATININE: 3 mg/dL — AB (ref 0.44–1.00)
CREATININE: 2.77 mg/dL — AB (ref 0.44–1.00)
Calcium: 9.7 mg/dL (ref 8.9–10.3)
Calcium: 9.2 mg/dL (ref 8.9–10.3)
Chloride: 109 mmol/L (ref 101–111)
GFR calc non Af Amer: 14 mL/min — ABNORMAL LOW (ref >60)
GFR calc non Af Amer: 16 mL/min — ABNORMAL LOW (ref >60)
GFR, EST AFRICAN AMERICAN: 16 mL/min — AB (ref >60)
GFR, EST AFRICAN AMERICAN: 18 mL/min — AB (ref >60)
GLUCOSE: 109 mg/dL — AB (ref 65–99)
Glucose, Bld: 105 mg/dL — ABNORMAL HIGH (ref 65–99)
Potassium: 4.4 mmol/L (ref 3.5–5.1)
Potassium: 4.5 mmol/L (ref 3.5–5.1)
SODIUM: 140 mmol/L (ref 135–145)
SODIUM: 143 mmol/L (ref 135–145)
Total Bilirubin: 15.3 mg/dL — ABNORMAL HIGH (ref 0.3–1.2)
Total Bilirubin: 14.4 mg/dL — ABNORMAL HIGH (ref 0.3–1.2)
Total Protein: 7.3 g/dL (ref 6.5–8.1)
Total Protein: 6.7 g/dL (ref 6.5–8.1)

## 2017-09-09 LAB — CBG MONITORING, ED
GLUCOSE-CAPILLARY: 92 mg/dL (ref 65–99)
Glucose-Capillary: 102 mg/dL — ABNORMAL HIGH (ref 65–99)
Glucose-Capillary: 89 mg/dL (ref 65–99)

## 2017-09-09 LAB — CBC WITH DIFFERENTIAL/PLATELET
Basophils Absolute: 0 10*3/uL (ref 0.0–0.1)
Basophils Absolute: 0 10*3/uL (ref 0.0–0.1)
Basophils Relative: 0 %
Basophils Relative: 0 %
Eosinophils Absolute: 0.1 10*3/uL (ref 0.0–0.7)
Eosinophils Absolute: 0.1 10*3/uL (ref 0.0–0.7)
Eosinophils Relative: 1 %
Eosinophils Relative: 1 %
HCT: 25.7 % — ABNORMAL LOW (ref 36.0–46.0)
HEMATOCRIT: 24.6 % — AB (ref 36.0–46.0)
HEMOGLOBIN: 9.1 g/dL — AB (ref 12.0–15.0)
Hemoglobin: 8.6 g/dL — ABNORMAL LOW (ref 12.0–15.0)
LYMPHS ABS: 1.9 10*3/uL (ref 0.7–4.0)
LYMPHS PCT: 15 %
LYMPHS PCT: 12 %
Lymphs Abs: 1.1 10*3/uL (ref 0.7–4.0)
MCH: 28 pg (ref 26.0–34.0)
MCH: 27.7 pg (ref 26.0–34.0)
MCHC: 35.4 g/dL (ref 30.0–36.0)
MCHC: 35 g/dL (ref 30.0–36.0)
MCV: 79.1 fL (ref 78.0–100.0)
MCV: 79.1 fL (ref 78.0–100.0)
MONOS PCT: 12 %
Monocytes Absolute: 1.5 10*3/uL — ABNORMAL HIGH (ref 0.1–1.0)
Monocytes Absolute: 1.2 10*3/uL — ABNORMAL HIGH (ref 0.1–1.0)
Monocytes Relative: 12 %
NEUTROS ABS: 7.4 10*3/uL (ref 1.7–7.7)
NEUTROS PCT: 72 %
Neutro Abs: 9 10*3/uL — ABNORMAL HIGH (ref 1.7–7.7)
Neutrophils Relative %: 75 %
Platelets: 390 10*3/uL (ref 150–400)
Platelets: 361 10*3/uL (ref 150–400)
RBC: 3.25 MIL/uL — AB (ref 3.87–5.11)
RBC: 3.11 MIL/uL — ABNORMAL LOW (ref 3.87–5.11)
RDW: 16.3 % — ABNORMAL HIGH (ref 11.5–15.5)
RDW: 16.5 % — AB (ref 11.5–15.5)
WBC: 12.5 10*3/uL — AB (ref 4.0–10.5)
WBC: 9.9 10*3/uL (ref 4.0–10.5)

## 2017-09-09 LAB — URINALYSIS, ROUTINE W REFLEX MICROSCOPIC
Glucose, UA: NEGATIVE mg/dL
Ketones, ur: NEGATIVE mg/dL
Nitrite: NEGATIVE
PROTEIN: NEGATIVE mg/dL
SPECIFIC GRAVITY, URINE: 1.012 (ref 1.005–1.030)
pH: 5 (ref 5.0–8.0)

## 2017-09-09 LAB — POC OCCULT BLOOD, ED: Fecal Occult Bld: POSITIVE — AB

## 2017-09-09 LAB — LIPASE, BLOOD: Lipase: 23 U/L (ref 11–51)

## 2017-09-09 LAB — TSH: TSH: 0.542 u[IU]/mL (ref 0.350–4.500)

## 2017-09-09 LAB — GLUCOSE, CAPILLARY
GLUCOSE-CAPILLARY: 76 mg/dL (ref 65–99)
Glucose-Capillary: 94 mg/dL (ref 65–99)

## 2017-09-09 LAB — CREATININE, URINE, RANDOM: Creatinine, Urine: 114.7 mg/dL

## 2017-09-09 LAB — SODIUM, URINE, RANDOM: Sodium, Ur: 86 mmol/L

## 2017-09-09 MED ORDER — PANTOPRAZOLE SODIUM 40 MG IV SOLR
40.0000 mg | Freq: Two times a day (BID) | INTRAVENOUS | Status: DC
  Administered 2017-09-09 – 2017-09-16 (×16): 40 mg via INTRAVENOUS
  Filled 2017-09-09 (×16): qty 40

## 2017-09-09 MED ORDER — ENSURE ENLIVE PO LIQD
237.0000 mL | Freq: Two times a day (BID) | ORAL | Status: DC
  Administered 2017-09-10: 237 mL via ORAL

## 2017-09-09 MED ORDER — SALINE SPRAY 0.65 % NA SOLN
2.0000 | NASAL | Status: DC | PRN
  Filled 2017-09-09: qty 44

## 2017-09-09 MED ORDER — LEVOTHYROXINE SODIUM 100 MCG IV SOLR
50.0000 ug | Freq: Every day | INTRAVENOUS | Status: DC
  Administered 2017-09-09: 50 ug via INTRAVENOUS
  Filled 2017-09-09 (×2): qty 5

## 2017-09-09 MED ORDER — ONDANSETRON HCL 4 MG/2ML IJ SOLN: 4.0000 mg | Freq: Four times a day (QID) | INTRAMUSCULAR | Status: DC | PRN

## 2017-09-09 MED ORDER — HYDRALAZINE HCL 20 MG/ML IJ SOLN: 10.0000 mg | INTRAMUSCULAR | Status: DC | PRN

## 2017-09-09 MED ORDER — SODIUM CHLORIDE 0.9 % IV SOLN
INTRAVENOUS | Status: AC
  Administered 2017-09-09 – 2017-09-10 (×3): via INTRAVENOUS

## 2017-09-09 MED ORDER — SODIUM CHLORIDE 0.9 % IV BOLUS (SEPSIS)
1000.0000 mL | Freq: Once | INTRAVENOUS | Status: AC
  Administered 2017-09-09: 1000 mL via INTRAVENOUS

## 2017-09-09 MED ORDER — PIPERACILLIN-TAZOBACTAM IN DEX 2-0.25 GM/50ML IV SOLN
2.2500 g | Freq: Four times a day (QID) | INTRAVENOUS | Status: AC
  Administered 2017-09-09 – 2017-09-11 (×9): 2.25 g via INTRAVENOUS
  Filled 2017-09-09 (×13): qty 50

## 2017-09-09 MED ORDER — ONDANSETRON HCL 4 MG PO TABS: 4.0000 mg | ORAL_TABLET | Freq: Four times a day (QID) | ORAL | Status: DC | PRN

## 2017-09-09 NOTE — Consult Note (Signed)
Referring Provider: [facility] Primary Care Physician:  Minette Brine Primary Gastroenterologist:  Zenovia Jarred, MD  Reason for Consultation:  Anemia and jaundice  ASSESSMENT AND PLAN:   38. 77 year old female admitted with weakness, anorexia, and painless jaundice. She has cholelithiasis and mildly dilated CBD on U/S but again NO abdominal pain.  Needs more advanced imaging. MRCP has been ordered. Hopefully this is not a malignancy. OF note she has been drinking a body cleanse called 'Alkaline".  -MRCP was ordered  -on Zosyn.   2.  Hemoccult positive microcytic anemia.  Her baseline hemoglobin is 11-12, currently down to 9.1.  -not overtly bleeding.  -PPI -Will address after evaluation of #1.   3. Recent recurrent dysphagia, now resolved with PPI.  4. AKI superimposed on CKD, probably stage 3. Baseline Cr 1.6, now 3.0  5. Hx of citrobacter Koseri liver abscess in 2013 treated with percutaneous drainage and abx.      [facility] Attending   I have taken an interval history, reviewed the chart and examined the patient. I agree with the Advanced Practitioner's note, impression and recommendations.   She needs an MRCP to help determine cause of biliary obstruction and jaundice.  Once that is back will determine next step which could be ERCP.  Gatha Mayer, MD, [facility] [phone] (pager) 09/09/2017 12:42 PM     HPI: Chelsey Savage is a 77 y.o. female with a hx of THN , hypothyroidism, breast cancer, CKD and liver abscess who presented to the emergency department last night with anorexia, decreased energy, and new scleral icterus noticed by patient's son. Patient developed recurrent dysphagia a few weeks ago. She was evaluated for it in ED on 1/15, given Prilosec and dysphagia has resolved. Over the last week she has had a poor appetite but drinking a lot of water. She has no associated nausea, vomiting nor abdominal pain.  Nothing unusual with  her bowel movements.  She does endorse pruritus. No nsaids but two weeks ago started drinking "Alkaline" to cleanse her system. Relative gave this to her.   ED Evaluation  Labs: CBC 12.5, hemoglobin 9.1, down from baseline of around 12. Cr 3, AST 116, ALT 129, T bili 15, alk phos 299.   Previous endoscopic evaluations by Dr. Irish Lack.   1. Colonoscopy March 2005 (initial screening). One polyps removed from left colon. Path c/w adenoma   2. Colonoscopy for surveillance June 2008. Exam to cecum, excellent prep. No polyps, + mild diverticulosis    Past Medical History:  Diagnosis Date  . Allergy   . Breast cancer ([place])    right  . Chronic knee pain    right  . Colon polyp   . GERD (gastroesophageal reflux disease)   . Hypertension   . Hypothyroidism   . Liver abscess   . Vertigo     Past Surgical History:  Procedure Laterality Date  . HEEL SPUR SURGERY Right   . MASTECTOMY Right 1979  . TONSILLECTOMY      Prior to Admission medications   Medication Sig Start Date End Date Taking? Authorizing Provider  acetaminophen (TYLENOL) 650 MG CR tablet Take 1,300 mg by mouth daily as needed for pain.   Yes [provider]  Calcium Carbonate-Vitamin D (CALTRATE 600+D) 600-400 MG-UNIT per chew tablet Chew 1 tablet by mouth daily.    Yes [provider]  cetirizine (ZYRTEC) 10 MG tablet Take 10 mg by mouth daily.   Yes [provider]  levothyroxine (  SYNTHROID, LEVOTHROID) 100 MCG tablet Take 100 mcg by mouth daily before breakfast.   Yes [provider]  multivitamin-lutein (OCUVITE-LUTEIN) CAPS capsule Take 1 capsule by mouth daily.   Yes [provider]  omeprazole (PRILOSEC) 20 MG capsule Take 1 capsule (20 mg total) by mouth daily. 08/30/17  Yes Isla Pence, MD  potassium chloride (MICRO-K) 10 MEQ CR capsule Take 10 mEq by mouth 2 (two) times daily.   Yes [provider]  sodium chloride (OCEAN) 0.65 % SOLN nasal spray Place 2  sprays into both nostrils as needed for congestion.   Yes [provider]  valsartan-hydrochlorothiazide (DIOVAN-HCT) 160-25 MG per tablet Take 1 tablet by mouth daily.  01/03/14  Yes [provider]    Current Facility-Administered Medications  Medication Dose Route Frequency Provider Last Rate Last Dose  . 0.9 %  sodium chloride infusion   Intravenous Continuous Jodie Echevaria, Amrit, MD 100 mL/hr at 09/09/17 0622    . hydrALAZINE (APRESOLINE) injection 10 mg  10 mg Intravenous Q4H PRN [name], MD      . levothyroxine (SYNTHROID, LEVOTHROID) injection 50 mcg  50 mcg Intravenous QAC breakfast [name], MD   50 mcg at 09/09/17 0757  . ondansetron (ZOFRAN) tablet 4 mg  4 mg Oral Q6H PRN [name], MD       Or  . ondansetron [facility]) injection 4 mg  4 mg Intravenous Q6H PRN [name], MD      . pantoprazole (PROTONIX) injection 40 mg  40 mg Intravenous Q12H [name], MD      . piperacillin-tazobactam (ZOSYN) IVPB 2.25 g  2.25 g Intravenous Q6H Dorrene German, [place] at 09/09/17 2297  . sodium chloride (OCEAN) 0.65 % nasal spray 2 spray  2 spray Each Nare PRN [name], MD       Current Outpatient Medications  Medication Sig Dispense Refill  . acetaminophen (TYLENOL) 650 MG CR tablet Take 1,300 mg by mouth daily as needed for pain.    . Calcium Carbonate-Vitamin D (CALTRATE 600+D) 600-400 MG-UNIT per chew tablet Chew 1 tablet by mouth daily.     . cetirizine (ZYRTEC) 10 MG tablet Take 10 mg by mouth daily.    Marland Kitchen levothyroxine (SYNTHROID, LEVOTHROID) 100 MCG tablet Take 100 mcg by mouth daily before breakfast.    . multivitamin-lutein (OCUVITE-LUTEIN) CAPS capsule Take 1 capsule by mouth daily.    Marland Kitchen omeprazole (PRILOSEC) 20 MG capsule Take 1 capsule (20 mg total) by mouth daily. 30 capsule 0  . potassium chloride (MICRO-K) 10 MEQ CR capsule Take 10 mEq by mouth 2 (two) times daily.    . sodium chloride  (OCEAN) 0.65 % SOLN nasal spray Place 2 sprays into both nostrils as needed for congestion.    . valsartan-hydrochlorothiazide (DIOVAN-HCT) 160-25 MG per tablet Take 1 tablet by mouth daily.       Allergies as of 09/06/2017 - Review Complete 09/10/2017  Allergen Reaction Noted  . Advil [ibuprofen] Other (See Comments) 09/15/2017  . Aleve [naproxen sodium] Other (See Comments) 09/14/2017  . Naproxen Other (See Comments) 08/21/2017    Family History  Problem Relation Age of Onset  . Anuerysm Mother        type unknown  . Diabetes Son        x 2    Social History   Socioeconomic History  . Marital status: Legally Separated    Spouse name: Not on file  .  Number of children: 3  . Years of education: Not on file  . Highest education level: Not on file  Social Needs  . Financial resource strain: Not on file  . Food insecurity - worry: Not on file  . Food insecurity - inability: Not on file  . Transportation needs - medical: Not on file  . Transportation needs - non-medical: Not on file  Occupational History  . Occupation: retired  Tobacco Use  . Smoking status: Former Smoker    Types: Cigarettes    Last attempt to quit: 08/16/1997    Years since quitting: 20.0  . Smokeless tobacco: Never Used  Substance and Sexual Activity  . Alcohol use: No  . Drug use: No  . Sexual activity: Not on file  Other Topics Concern  . Not on file  Social History Narrative  . Not on file    Review of Systems: All systems reviewed and negative except where noted in HPI.  Physical Exam: Vital signs in last 24 hours: Temp:  [97.7 F (36.5 C)] 97.7 F (36.5 C) (01/24 2129) Pulse Rate:  [59-72] 71 (01/25 0904) Resp:  [14-21] 15 (01/25 0904) BP: (101-120)/(39-76) 115/49 (01/25 0904) SpO2:  [90 %-100 %] 98 % (01/25 0904)   General:   Alert, well-developed,  Black female in NAD Psych:  Pleasant, cooperative. Normal mood and affect. Eyes:  Pupils equal, sclera clear, no icterus.    Conjunctiva pink. Ears:  Normal auditory acuity. Nose:  No deformity, discharge,  or lesions. Neck:  Supple; no masses Lungs:  Clear throughout to auscultation.   No wheezes, crackles, or rhonchi.  Heart:  Regular rate and rhythm; no murmurs, no edema Abdomen:  Soft, non-distended, nontender, BS active, no palp mass    Rectal:  Deferred  Msk:  Symmetrical without gross deformities. . Pulses:  Normal pulses noted. Neurologic:  Alert and  oriented x4;  grossly normal neurologically. Skin:  Intact without significant lesions or rashes..   Intake/Output from previous day: No intake/output data recorded. Intake/Output this shift: Total I/O In: 50 [IV Piggyback:50] Out: -   Lab Results: Recent Labs    09/09/17 0102  WBC 12.5*  HGB 9.1*  HCT 25.7*  PLT 390   BMET Recent Labs    09/09/17 0102  NA 140  K 4.4  CL 109  CO2 19*  GLUCOSE 109*  BUN 48*  CREATININE 3.00*  CALCIUM 9.7   LFT Recent Labs    09/09/17 0102  PROT 7.3  ALBUMIN 2.6*  AST 116*  ALT 129*  ALKPHOS 299*  BILITOT 15.3*   PT/INR No results for input(s): LABPROT, INR in the last 72 hours. Hepatitis Panel No results for input(s): HEPBSAG, HCVAB, HEPAIGM, HEPBIGM in the last 72 hours.    Studies/Results: Dg Chest Port 1 View  Result Date: 09/09/2017 CLINICAL DATA:  Fatigue and weakness EXAM: PORTABLE CHEST 1 VIEW COMPARISON:  March 02, 2014 FINDINGS: There is no edema or consolidation. The heart is upper normal in size with pulmonary vascularity within normal limits. There is aortic atherosclerosis. There are surgical clips over the right upper hemithorax, stable. No evident bone lesions. IMPRESSION: Aortic atherosclerosis. No edema or consolidation. Stable cardiac silhouette. Aortic Atherosclerosis (ICD10-I70.0). Electronically Signed   By: Lowella Grip III M.D.   On: 09/09/2017 07:13   US Abdomen Limited Ruq  Result Date: 09/09/2017 CLINICAL DATA:  Increased bilirubin.  History of  gallstones. EXAM: ULTRASOUND ABDOMEN LIMITED RIGHT UPPER QUADRANT COMPARISON:  Ultrasound kidneys 10/15/2016 FINDINGS: Gallbladder:  Cholelithiasis with multiple stones and sludge filling the gallbladder. Largest stone is measured at 1.6 cm. Gallbladder wall thickness is mildly increased at 3.4 mm. Murphy's sign is negative. Common bile duct: Diameter: Dilated at 8.6 mm. No cause of obstruction identified but the entire duct is not visualized. Liver: No focal lesion identified. Within normal limits in parenchymal echogenicity. Intrahepatic bile duct dilatation is present. Portal vein is patent on color Doppler imaging with normal direction of blood flow towards the liver. IMPRESSION: 1. Cholelithiasis with sludge and gallbladder wall thickening. Changes may represent cholecystitis although Murphy's sign is negative. 2. Dilated intra and extrahepatic bile ducts suspicious for obstruction. No stone is identified but the entire common bile duct is not visualized. Electronically Signed   By: Lucienne Capers M.D.   On: 09/09/2017 03:02     Tye Savoy, NP-C @  09/09/2017, 9:34 AM  Pager number [phone]

## 2017-09-09 NOTE — Progress Notes (Signed)
Pharmacy Antibiotic Note  Chelsey Savage is a 77 y.o. female c/o fatigue and decreased appetite admitted on 08/23/2017 with inta-abdominal infection.  Pharmacy has been consulted for zosyn dosing.  Plan: Zosyn 2.25 Gm IV q6h Scr=18 ml/min (N) F/u scr/cultures     Temp (24hrs), Avg:97.7 F (36.5 C), Min:97.7 F (36.5 C), Max:97.7 F (36.5 C)  Recent Labs  Lab 09/09/17 0102  WBC 12.5*  CREATININE 3.00*    CrCl cannot be calculated (Unknown ideal weight.).    Allergies  Allergen Reactions  . Advil [Ibuprofen] Other (See Comments)    Kidney MD said not to take  . Aleve [Naproxen Sodium] Other (See Comments)    Kidney MD said not to take  . Naproxen Other (See Comments)    Kidney MD said not to take    Antimicrobials this admission: 1/25 zosyn >>    >>   Dose adjustments this admission:   Microbiology results:  BCx:   UCx:    Sputum:    MRSA PCR:   Thank you for allowing pharmacy to be a part of this patient's care.  Dorrene German 09/09/2017 5:56 AM

## 2017-09-09 NOTE — ED Notes (Signed)
ED TO INPATIENT HANDOFF REPORT  Name/Age/Gender Chelsey Savage 77 y.o. female  Code Status    Code Status Orders  (From admission, onward)        Start     Ordered   09/09/17 0534  Full code  Continuous     09/09/17 0535    Code Status History    Date Active Date Inactive Code Status Order ID Comments User Context   03/27/2012 01:53 04/03/2012 19:00 Full Code [id number]  Chelsey Deiters, RN Inpatient      Home/SNF/Other Home  Chief Complaint Fatigue;Decreased appetite  Level of Care/Admitting Diagnosis ED Disposition    ED Disposition Condition Chelsey Savage Area: [facility] [[id number]]  Level of Care: Telemetry [5]  Admit to tele based on following criteria: Monitor for Ischemic changes  Diagnosis: Jaundice [242209]  Admitting Physician: [name] [phone]  Attending Physician: [name] [phone]  Estimated length of stay: past midnight tomorrow  Certification:: I certify this patient will need inpatient services for at least 2 midnights  PT Class (Do Not Modify): Inpatient [101]  PT Acc Code (Do Not Modify): Private [1]       Medical History Past Medical History:  Diagnosis Date  . Allergy   . Breast cancer ([place])    right  . Chronic knee pain    right  . Colon polyp   . GERD (gastroesophageal reflux disease)   . Hypertension   . Hypothyroidism   . Liver abscess   . Vertigo     Allergies Allergies  Allergen Reactions  . Advil [Ibuprofen] Other (See Comments)    Kidney MD said not to take  . Aleve [Naproxen Sodium] Other (See Comments)    Kidney MD said not to take  . Naproxen Other (See Comments)    Kidney MD said not to take    IV Location/Drains/Wounds Patient Lines/Drains/Airways Status   Active Line/Drains/Airways    Name:   Placement date:   Placement time:   Site:   Days:   Peripheral IV 09/09/17 Left Antecubital   09/09/17    0111    Antecubital   less than 1           Labs/Imaging Results for orders placed or performed during the Savage encounter of 09/06/2017 (from the past 48 hour(s))  CBC with Differential     Status: Abnormal   Collection Time: 09/09/17  1:02 AM  Result Value Ref Range   WBC 12.5 (H) 4.0 - 10.5 K/uL   RBC 3.25 (L) 3.87 - 5.11 MIL/uL   Hemoglobin 9.1 (L) 12.0 - 15.0 g/dL   HCT 25.7 (L) 36.0 - 46.0 %   MCV 79.1 78.0 - 100.0 fL   MCH 28.0 26.0 - 34.0 pg   MCHC 35.4 30.0 - 36.0 g/dL   RDW 16.3 (H) 11.5 - 15.5 %   Platelets 390 150 - 400 K/uL   Neutrophils Relative % 72 %   Neutro Abs 9.0 (H) 1.7 - 7.7 K/uL   Lymphocytes Relative 15 %   Lymphs Abs 1.9 0.7 - 4.0 K/uL   Monocytes Relative 12 %   Monocytes Absolute 1.5 (H) 0.1 - 1.0 K/uL   Eosinophils Relative 1 %   Eosinophils Absolute 0.1 0.0 - 0.7 K/uL   Basophils Relative 0 %   Basophils Absolute 0.0 0.0 - 0.1 K/uL  TSH     Status: None   Collection Time: 09/09/17  1:02 AM  Result  Value Ref Range   TSH 0.542 0.350 - 4.500 uIU/mL    Comment: Performed by a 3rd Generation assay with a functional sensitivity of <=0.01 uIU/mL.  Comprehensive metabolic panel     Status: Abnormal   Collection Time: 09/09/17  1:02 AM  Result Value Ref Range   Sodium 140 135 - 145 mmol/L   Potassium 4.4 3.5 - 5.1 mmol/L   Chloride 109 101 - 111 mmol/L   CO2 19 (L) 22 - 32 mmol/L   Glucose, Bld 109 (H) 65 - 99 mg/dL   BUN 48 (H) 6 - 20 mg/dL   Creatinine, Ser 3.00 (H) 0.44 - 1.00 mg/dL   Calcium 9.7 8.9 - 10.3 mg/dL   Total Protein 7.3 6.5 - 8.1 g/dL   Albumin 2.6 (L) 3.5 - 5.0 g/dL   AST 116 (H) 15 - 41 U/L   ALT 129 (H) 14 - 54 U/L   Alkaline Phosphatase 299 (H) 38 - 126 U/L   Total Bilirubin 15.3 (H) 0.3 - 1.2 mg/dL   GFR calc non Af Amer 14 (L) >60 mL/min   GFR calc Af Amer 16 (L) >60 mL/min    Comment: (NOTE) The eGFR has been calculated using the CKD EPI equation. This calculation has not been validated in all clinical situations. eGFR's persistently <60 mL/min signify  possible Chronic Kidney Disease.    Anion gap 12 5 - 15  Lipase, blood     Status: None   Collection Time: 09/09/17  1:02 AM  Result Value Ref Range   Lipase 23 11 - 51 U/L  CBG monitoring, ED     Status: Abnormal   Collection Time: 09/09/17  1:04 AM  Result Value Ref Range   Glucose-Capillary 102 (H) 65 - 99 mg/dL  POC occult blood, ED RN will collect     Status: Abnormal   Collection Time: 09/09/17  1:30 AM  Result Value Ref Range   Fecal Occult Bld POSITIVE (A) NEGATIVE  CBG monitoring, ED     Status: None   Collection Time: 09/09/17  6:42 AM  Result Value Ref Range   Glucose-Capillary 92 65 - 99 mg/dL  Urinalysis, Routine w reflex microscopic     Status: Abnormal   Collection Time: 09/09/17  8:40 AM  Result Value Ref Range   Color, Urine AMBER (A) YELLOW    Comment: BIOCHEMICALS MAY BE AFFECTED BY COLOR   APPearance HAZY (A) CLEAR   Specific Gravity, Urine 1.012 1.005 - 1.030   pH 5.0 5.0 - 8.0   Glucose, UA NEGATIVE NEGATIVE mg/dL   Hgb urine dipstick SMALL (A) NEGATIVE   Bilirubin Urine SMALL (A) NEGATIVE   Ketones, ur NEGATIVE NEGATIVE mg/dL   Protein, ur NEGATIVE NEGATIVE mg/dL   Nitrite NEGATIVE NEGATIVE   Leukocytes, UA LARGE (A) NEGATIVE   RBC / HPF 0-5 0 - 5 RBC/hpf   WBC, UA 6-30 0 - 5 WBC/hpf   Bacteria, UA RARE (A) NONE SEEN   Squamous Epithelial / LPF 0-5 (A) NONE SEEN   WBC Clumps PRESENT    Mucus PRESENT    Hyaline Casts, UA PRESENT   Sodium, urine, random     Status: None   Collection Time: 09/09/17  8:40 AM  Result Value Ref Range   Sodium, Ur 86 mmol/L    Comment: Performed at [facility], [place] [address], [place], [place] [address]  Creatinine, urine, random     Status: None   Collection Time: 09/09/17  8:40  AM  Result Value Ref Range   Creatinine, Urine 114.70 mg/dL    Comment: Performed at [facility] [address] [place], [place] [address]  CBC WITH DIFFERENTIAL     Status: Abnormal   Collection Time: 09/09/17 12:23 PM   Result Value Ref Range   WBC 9.9 4.0 - 10.5 K/uL   RBC 3.11 (L) 3.87 - 5.11 MIL/uL   Hemoglobin 8.6 (L) 12.0 - 15.0 g/dL   HCT 24.6 (L) 36.0 - 46.0 %   MCV 79.1 78.0 - 100.0 fL   MCH 27.7 26.0 - 34.0 pg   MCHC 35.0 30.0 - 36.0 g/dL   RDW 16.5 (H) 11.5 - 15.5 %   Platelets 361 150 - 400 K/uL   Neutrophils Relative % 75 %   Neutro Abs 7.4 1.7 - 7.7 K/uL   Lymphocytes Relative 12 %   Lymphs Abs 1.1 0.7 - 4.0 K/uL   Monocytes Relative 12 %   Monocytes Absolute 1.2 (H) 0.1 - 1.0 K/uL   Eosinophils Relative 1 %   Eosinophils Absolute 0.1 0.0 - 0.7 K/uL   Basophils Relative 0 %   Basophils Absolute 0.0 0.0 - 0.1 K/uL  Comprehensive metabolic panel     Status: Abnormal   Collection Time: 09/09/17 12:23 PM  Result Value Ref Range   Sodium 143 135 - 145 mmol/L   Potassium 4.5 3.5 - 5.1 mmol/L   Chloride 115 (H) 101 - 111 mmol/L   CO2 18 (L) 22 - 32 mmol/L   Glucose, Bld 105 (H) 65 - 99 mg/dL   BUN 44 (H) 6 - 20 mg/dL   Creatinine, Ser 2.77 (H) 0.44 - 1.00 mg/dL   Calcium 9.2 8.9 - 10.3 mg/dL   Total Protein 6.7 6.5 - 8.1 g/dL   Albumin 2.4 (L) 3.5 - 5.0 g/dL   AST 92 (H) 15 - 41 U/L   ALT 108 (H) 14 - 54 U/L   Alkaline Phosphatase 282 (H) 38 - 126 U/L   Total Bilirubin 14.4 (H) 0.3 - 1.2 mg/dL   GFR calc non Af Amer 16 (L) >60 mL/min   GFR calc Af Amer 18 (L) >60 mL/min    Comment: (NOTE) The eGFR has been calculated using the CKD EPI equation. This calculation has not been validated in all clinical situations. eGFR's persistently <60 mL/min signify possible Chronic Kidney Disease.    Anion gap 10 5 - 15  CBG monitoring, ED     Status: None   Collection Time: 09/09/17  2:28 PM  Result Value Ref Range   Glucose-Capillary 89 65 - 99 mg/dL   Dg Chest Port 1 View  Result Date: 09/09/2017 CLINICAL DATA:  Fatigue and weakness EXAM: PORTABLE CHEST 1 VIEW COMPARISON:  March 02, 2014 FINDINGS: There is no edema or consolidation. The heart is upper normal in size with pulmonary  vascularity within normal limits. There is aortic atherosclerosis. There are surgical clips over the right upper hemithorax, stable. No evident bone lesions. IMPRESSION: Aortic atherosclerosis. No edema or consolidation. Stable cardiac silhouette. Aortic Atherosclerosis (ICD10-I70.0). Electronically Signed   By: Lowella Grip III M.D.   On: 09/09/2017 07:13   US Abdomen Limited Ruq  Result Date: 09/09/2017 CLINICAL DATA:  Increased bilirubin.  History of gallstones. EXAM: ULTRASOUND ABDOMEN LIMITED RIGHT UPPER QUADRANT COMPARISON:  Ultrasound kidneys 10/15/2016 FINDINGS: Gallbladder: Cholelithiasis with multiple stones and sludge filling the gallbladder. Largest stone is measured at 1.6 cm. Gallbladder wall thickness is mildly increased at 3.4  mm. Murphy's sign is negative. Common bile duct: Diameter: Dilated at 8.6 mm. No cause of obstruction identified but the entire duct is not visualized. Liver: No focal lesion identified. Within normal limits in parenchymal echogenicity. Intrahepatic bile duct dilatation is present. Portal vein is patent on color Doppler imaging with normal direction of blood flow towards the liver. IMPRESSION: 1. Cholelithiasis with sludge and gallbladder wall thickening. Changes may represent cholecystitis although Murphy's sign is negative. 2. Dilated intra and extrahepatic bile ducts suspicious for obstruction. No stone is identified but the entire common bile duct is not visualized. Electronically Signed   By: Lucienne Capers M.D.   On: 09/09/2017 03:02    Pending Labs Unresulted Labs (From admission, onward)   None      Vitals/Pain Today's Vitals   09/09/17 1136 09/09/17 1215 09/09/17 1300 09/09/17 1345  BP: (!) 93/48 110/65 (!) 126/56 98/74  Pulse: 67 67 69 62  Resp: 19  (!) 30 16  Temp:      TempSrc:      SpO2: 97% 95% 97% 96%  PainSc:        Isolation Precautions No active isolations  Medications Medications  sodium chloride (OCEAN) 0.65 % nasal spray  2 spray (not administered)  ondansetron (ZOFRAN) tablet 4 mg (not administered)    Or  ondansetron (ZOFRAN) injection 4 mg (not administered)  0.9 %  sodium chloride infusion ( Intravenous Rate/Dose Change 09/09/17 1050)  levothyroxine (SYNTHROID, LEVOTHROID) injection 50 mcg (50 mcg Intravenous Given 09/09/17 0757)  hydrALAZINE (APRESOLINE) injection 10 mg (not administered)  piperacillin-tazobactam (ZOSYN) IVPB 2.25 g (0 g Intravenous Stopped 09/09/17 1530)  pantoprazole (PROTONIX) injection 40 mg (40 mg Intravenous Given 09/09/17 1051)  sodium chloride 0.9 % bolus 1,000 mL (0 mLs Intravenous Stopped 09/09/17 0419)    Mobility walks with person assist

## 2017-09-09 NOTE — H&P (Addendum)
History and Physical    CAMBREA KIRT GGY:[id number] DOB: 11/11/1940 DOA: 09/11/2017  PCP: Minette Brine  Patient coming from: Home.  Chief Complaint: Weakness fatigue poor appetite.  HPI: Chelsey Savage is a 77 y.o. female with history of hypertension, hypothyroidism, chronic kidney disease and previously treated for liver abscess, history of breast cancer presents to the ER with complaint of weakness and fatigue.  Patient states last week patient had come with difficulty of swallowing and was placed on PPI following which patient swelling improved.  Sharen Counter benign poor appetite with progressive weakness.  Denies any vomiting or diarrhea.  Denies any breath.  ED Course: In the ER patient's labs show elevated creatinine from baseline in 2017 was 1.6 is increased to 3.  Patient's AST ALT were increased to 116/129.  Total bilirubin was markedly elevated at 15.3.  Patient had sonogram of the abdomen done which shows which is concerning for intrahepatic and extrahepatic biliary dilation with possible distal obstruction.  No definite obstruction seen in CBD.  There was gallstones with features concerning for cholecystitis but negative Murphy sign.  Patient denies any abdominal pain.  Patient is afebrile.  Patient is admitted for further workup of jaundice.  In addition patient is found to have anemia which is worsened from baseline with stool for occult blood positive.  Review of Systems: As per HPI, rest all negative.   Past Medical History:  Diagnosis Date  . Allergy   . Breast cancer ([place])    right  . Chronic knee pain    right  . Colon polyp   . GERD (gastroesophageal reflux disease)   . Hypertension   . Hypothyroidism   . Liver abscess   . Vertigo     Past Surgical History:  Procedure Laterality Date  . HEEL SPUR SURGERY Right   . MASTECTOMY Right 1979  . TONSILLECTOMY       reports that she quit smoking about 20 years ago. Her smoking use included cigarettes. she has never  used smokeless tobacco. She reports that she does not drink alcohol or use drugs.  Allergies  Allergen Reactions  . Advil [Ibuprofen] Other (See Comments)    Kidney MD said not to take  . Aleve [Naproxen Sodium] Other (See Comments)    Kidney MD said not to take  . Naproxen Other (See Comments)    Kidney MD said not to take    Family History  Problem Relation Age of Onset  . Anuerysm Mother        type unknown  . Diabetes Son        x 2    Prior to Admission medications   Medication Sig Start Date End Date Taking? Authorizing Provider  acetaminophen (TYLENOL) 650 MG CR tablet Take 1,300 mg by mouth daily as needed for pain.   Yes [provider]  Calcium Carbonate-Vitamin D (CALTRATE 600+D) 600-400 MG-UNIT per chew tablet Chew 1 tablet by mouth daily.    Yes [provider]  cetirizine (ZYRTEC) 10 MG tablet Take 10 mg by mouth daily.   Yes [provider]  levothyroxine (SYNTHROID, LEVOTHROID) 100 MCG tablet Take 100 mcg by mouth daily before breakfast.   Yes [provider]  multivitamin-lutein (OCUVITE-LUTEIN) CAPS capsule Take 1 capsule by mouth daily.   Yes [provider]  omeprazole (PRILOSEC) 20 MG capsule Take 1 capsule (20 mg total) by mouth daily. 08/30/17  Yes Isla Pence, MD  potassium chloride (MICRO-K) 10 MEQ CR capsule  Take 10 mEq by mouth 2 (two) times daily.   Yes [provider]  sodium chloride (OCEAN) 0.65 % SOLN nasal spray Place 2 sprays into both nostrils as needed for congestion.   Yes [provider]  valsartan-hydrochlorothiazide (DIOVAN-HCT) 160-25 MG per tablet Take 1 tablet by mouth daily.  01/03/14  Yes [provider]    Physical Exam: Vitals:   09/05/2017 2129 08/22/2017 2318 09/09/17 0148 09/09/17 0522  BP: (!) 101/57 120/76 (!) 102/39 (!) 106/47  Pulse: 65 (!) 59 61 72  Resp: 18 14 17  (!) 21  Temp: 97.7 F (36.5 C)     TempSrc: Oral     SpO2: 100% 100% 90% 98%       Constitutional: Moderately built and nourished. Vitals:   08/23/2017 2129 09/03/2017 2318 09/09/17 0148 09/09/17 0522  BP: (!) 101/57 120/76 (!) 102/39 (!) 106/47  Pulse: 65 (!) 59 61 72  Resp: 18 14 17  (!) 21  Temp: 97.7 F (36.5 C)     TempSrc: Oral     SpO2: 100% 100% 90% 98%   Eyes: Icterus present mild pallor. ENMT: No discharge from the ears eyes nose or mouth. Neck: No mass palpated no JVD appreciated. Respiratory: No rhonchi or crepitations. Cardiovascular: S1-S2 heard no murmurs appreciated. Abdomen: Soft nontender bowel sounds present. Musculoskeletal: No edema.  No joint effusion. Skin: No rash.  Skin appears warm. Neurologic: Alert awake oriented to time place and person.  Moves all extremities. Psychiatric: Appears normal.  Normal affect.   Labs on Admission: I have personally reviewed following labs and imaging studies  CBC: Recent Labs  Lab 09/09/17 0102  WBC 12.5*  NEUTROABS 9.0*  HGB 9.1*  HCT 25.7*  MCV 79.1  PLT 409   Basic Metabolic Panel: Recent Labs  Lab 09/09/17 0102  NA 140  K 4.4  CL 109  CO2 19*  GLUCOSE 109*  BUN 48*  CREATININE 3.00*  CALCIUM 9.7   GFR: CrCl cannot be calculated (Unknown ideal weight.). Liver Function Tests: Recent Labs  Lab 09/09/17 0102  AST 116*  ALT 129*  ALKPHOS 299*  BILITOT 15.3*  PROT 7.3  ALBUMIN 2.6*   Recent Labs  Lab 09/09/17 0102  LIPASE 23   No results for input(s): AMMONIA in the last 168 hours. Coagulation Profile: No results for input(s): INR, PROTIME in the last 168 hours. Cardiac Enzymes: No results for input(s): CKTOTAL, CKMB, CKMBINDEX, TROPONINI in the last 168 hours. BNP (last 3 results) No results for input(s): PROBNP in the last 8760 hours. HbA1C: No results for input(s): HGBA1C in the last 72 hours. CBG: Recent Labs  Lab 09/09/17 0104  GLUCAP 102*   Lipid Profile: No results for input(s): CHOL, HDL, LDLCALC, TRIG, CHOLHDL, LDLDIRECT in the last 72  hours. Thyroid Function Tests: Recent Labs    09/09/17 0102  TSH 0.542   Anemia Panel: No results for input(s): VITAMINB12, FOLATE, FERRITIN, TIBC, IRON, RETICCTPCT in the last 72 hours. Urine analysis:    Component Value Date/Time   COLORURINE YELLOW 05/12/2016 0230   APPEARANCEUR CLOUDY (A) 05/12/2016 0230   LABSPEC 1.018 05/12/2016 0230   PHURINE 5.0 05/12/2016 0230   GLUCOSEU NEGATIVE 05/12/2016 0230   HGBUR NEGATIVE 05/12/2016 0230   BILIRUBINUR NEGATIVE 05/12/2016 0230   KETONESUR NEGATIVE 05/12/2016 0230   PROTEINUR NEGATIVE 05/12/2016 0230   UROBILINOGEN 4.0 (H) 03/26/2012 1941   NITRITE NEGATIVE 05/12/2016 0230   LEUKOCYTESUR LARGE (A) 05/12/2016 0230   Sepsis Labs: @LABRCNTIP (procalcitonin:4,lacticidven:4) )No results  found for this or any previous visit (from the past 240 hour(s)).   Radiological Exams on Admission: US Abdomen Limited Ruq  Result Date: 09/09/2017 CLINICAL DATA:  Increased bilirubin.  History of gallstones. EXAM: ULTRASOUND ABDOMEN LIMITED RIGHT UPPER QUADRANT COMPARISON:  Ultrasound kidneys 10/15/2016 FINDINGS: Gallbladder: Cholelithiasis with multiple stones and sludge filling the gallbladder. Largest stone is measured at 1.6 cm. Gallbladder wall thickness is mildly increased at 3.4 mm. Murphy's sign is negative. Common bile duct: Diameter: Dilated at 8.6 mm. No cause of obstruction identified but the entire duct is not visualized. Liver: No focal lesion identified. Within normal limits in parenchymal echogenicity. Intrahepatic bile duct dilatation is present. Portal vein is patent on color Doppler imaging with normal direction of blood flow towards the liver. IMPRESSION: 1. Cholelithiasis with sludge and gallbladder wall thickening. Changes may represent cholecystitis although Murphy's sign is negative. 2. Dilated intra and extrahepatic bile ducts suspicious for obstruction. No stone is identified but the entire common bile duct is not visualized.  Electronically Signed   By: Lucienne Capers M.D.   On: 09/09/2017 03:02     Assessment/Plan Principal Problem:   Jaundice Active Problems:   Hypertension   Hypothyroid   Acute kidney injury superimposed on chronic kidney disease (HCC)   ARF (acute renal failure) (HCC)    1. Jaundice with gallstones -patient sonogram was showing possible distal obstruction with external and internal hepatic biliary dilation.  I have ordered MRCP.  For now we will keep patient on Zosyn.  Please consult gastroenterologist in a.m. for possible need of ERCP based on MRCP findings.  May also need surgical consult. 2. Acute on chronic renal failure probably secondary to poor oral intake -I have placed patient on IV fluids.  Check urine studies.  Closely follow metabolic panel intake output.  Holding ARB and hydrochlorothiazide due to renal failure. 3. Anemia with possible GI bleed -I have placed patient on Protonix.  Follow CBC.  GI consult. 4. Hypothyroidism -since patient is n.p.o. I have placed patient on IV Synthroid. 5. Hypertension -since patient is n.p.o. I have placed patient on PRN IV hydralazine. 6. Previous history of liver abscess. 7. Insect in the left ear will need ENT consult in the morning.  ER physician was unable to remove the insect.   DVT prophylaxis: SCDs. Code Status: Full code. Family Communication: Discussed with patient. Disposition Plan: Home. Consults called: None. Admission status: Inpatient.   [name] MD [facility] Pager [phone].  If 7PM-7AM, please contact night-coverage [email or web address] Password [facility]  09/09/2017, 5:36 AM

## 2017-09-09 NOTE — Consult Note (Signed)
Reason for Consult: Foreign body (insect) in right ear Referring Physician: Gean Birchwood, MD  HPI:  Chelsey Savage is an 77 y.o. female admitted to [facility] today for treatment of her renal failure, fatigue, and biliary obstruction. The patient also complains of a buzzing sound in her right ear. The ER provider noted a possible insect in her right ear canal. ENT is consulted for foreign body removal. The patient currently denies any otalgia, otorrhea, or vertigo. She has no previous history of otologic surgery.  Past Medical History:  Diagnosis Date  . Allergy   . Breast cancer ([place])    right  . Chronic knee pain    right  . Colon polyp   . GERD (gastroesophageal reflux disease)   . Hypertension   . Hypothyroidism   . Liver abscess   . Vertigo     Past Surgical History:  Procedure Laterality Date  . HEEL SPUR SURGERY Right   . MASTECTOMY Right 1979  . TONSILLECTOMY      Family History  Problem Relation Age of Onset  . Anuerysm Mother        type unknown  . Diabetes Son        x 2    Social History:  reports that she quit smoking about 20 years ago. Her smoking use included cigarettes. she has never used smokeless tobacco. She reports that she does not drink alcohol or use drugs.  Allergies:  Allergies  Allergen Reactions  . Advil [Ibuprofen] Other (See Comments)    Kidney MD said not to take  . Aleve [Naproxen Sodium] Other (See Comments)    Kidney MD said not to take  . Naproxen Other (See Comments)    Kidney MD said not to take    Prior to Admission medications   Medication Sig Start Date End Date Taking? Authorizing Provider  acetaminophen (TYLENOL) 650 MG CR tablet Take 1,300 mg by mouth daily as needed for pain.   Yes [provider]  Calcium Carbonate-Vitamin D (CALTRATE 600+D) 600-400 MG-UNIT per chew tablet Chew 1 tablet by mouth daily.    Yes [provider]  cetirizine (ZYRTEC) 10 MG tablet Take 10 mg by mouth daily.    Yes [provider]  levothyroxine (SYNTHROID, LEVOTHROID) 100 MCG tablet Take 100 mcg by mouth daily before breakfast.   Yes [provider]  multivitamin-lutein (OCUVITE-LUTEIN) CAPS capsule Take 1 capsule by mouth daily.   Yes [provider]  omeprazole (PRILOSEC) 20 MG capsule Take 1 capsule (20 mg total) by mouth daily. 08/30/17  Yes Isla Pence, MD  potassium chloride (MICRO-K) 10 MEQ CR capsule Take 10 mEq by mouth 2 (two) times daily.   Yes [provider]  sodium chloride (OCEAN) 0.65 % SOLN nasal spray Place 2 sprays into both nostrils as needed for congestion.   Yes [provider]  valsartan-hydrochlorothiazide (DIOVAN-HCT) 160-25 MG per tablet Take 1 tablet by mouth daily.  01/03/14  Yes [provider]    Results for orders placed or performed during the hospital encounter of 08/17/2017 (from the past 48 hour(s))  CBC with Differential     Status: Abnormal   Collection Time: 09/09/17  1:02 AM  Result Value Ref Range   WBC 12.5 (H) 4.0 - 10.5 K/uL   RBC 3.25 (L) 3.87 - 5.11 MIL/uL   Hemoglobin 9.1 (L) 12.0 - 15.0 g/dL   HCT 25.7 (L) 36.0 - 46.0 %   MCV 79.1 78.0 - 100.0  fL   MCH 28.0 26.0 - 34.0 pg   MCHC 35.4 30.0 - 36.0 g/dL   RDW 16.3 (H) 11.5 - 15.5 %   Platelets 390 150 - 400 K/uL   Neutrophils Relative % 72 %   Neutro Abs 9.0 (H) 1.7 - 7.7 K/uL   Lymphocytes Relative 15 %   Lymphs Abs 1.9 0.7 - 4.0 K/uL   Monocytes Relative 12 %   Monocytes Absolute 1.5 (H) 0.1 - 1.0 K/uL   Eosinophils Relative 1 %   Eosinophils Absolute 0.1 0.0 - 0.7 K/uL   Basophils Relative 0 %   Basophils Absolute 0.0 0.0 - 0.1 K/uL  TSH     Status: None   Collection Time: 09/09/17  1:02 AM  Result Value Ref Range   TSH 0.542 0.350 - 4.500 uIU/mL    Comment: Performed by a 3rd Generation assay with a functional sensitivity of <=0.01 uIU/mL.  Comprehensive metabolic panel     Status: Abnormal   Collection Time: 09/09/17  1:02 AM   Result Value Ref Range   Sodium 140 135 - 145 mmol/L   Potassium 4.4 3.5 - 5.1 mmol/L   Chloride 109 101 - 111 mmol/L   CO2 19 (L) 22 - 32 mmol/L   Glucose, Bld 109 (H) 65 - 99 mg/dL   BUN 48 (H) 6 - 20 mg/dL   Creatinine, Ser 3.00 (H) 0.44 - 1.00 mg/dL   Calcium 9.7 8.9 - 10.3 mg/dL   Total Protein 7.3 6.5 - 8.1 g/dL   Albumin 2.6 (L) 3.5 - 5.0 g/dL   AST 116 (H) 15 - 41 U/L   ALT 129 (H) 14 - 54 U/L   Alkaline Phosphatase 299 (H) 38 - 126 U/L   Total Bilirubin 15.3 (H) 0.3 - 1.2 mg/dL   GFR calc non Af Amer 14 (L) >60 mL/min   GFR calc Af Amer 16 (L) >60 mL/min    Comment: (NOTE) The eGFR has been calculated using the CKD EPI equation. This calculation has not been validated in all clinical situations. eGFR's persistently <60 mL/min signify possible Chronic Kidney Disease.    Anion gap 12 5 - 15  Lipase, blood     Status: None   Collection Time: 09/09/17  1:02 AM  Result Value Ref Range   Lipase 23 11 - 51 U/L  CBG monitoring, ED     Status: Abnormal   Collection Time: 09/09/17  1:04 AM  Result Value Ref Range   Glucose-Capillary 102 (H) 65 - 99 mg/dL  POC occult blood, ED RN will collect     Status: Abnormal   Collection Time: 09/09/17  1:30 AM  Result Value Ref Range   Fecal Occult Bld POSITIVE (A) NEGATIVE  CBG monitoring, ED     Status: None   Collection Time: 09/09/17  6:42 AM  Result Value Ref Range   Glucose-Capillary 92 65 - 99 mg/dL  Urinalysis, Routine w reflex microscopic     Status: Abnormal   Collection Time: 09/09/17  8:40 AM  Result Value Ref Range   Color, Urine AMBER (A) YELLOW    Comment: BIOCHEMICALS MAY BE AFFECTED BY COLOR   APPearance HAZY (A) CLEAR   Specific Gravity, Urine 1.012 1.005 - 1.030   pH 5.0 5.0 - 8.0   Glucose, UA NEGATIVE NEGATIVE mg/dL   Hgb urine dipstick SMALL (A) NEGATIVE   Bilirubin Urine SMALL (A) NEGATIVE   Ketones, ur NEGATIVE NEGATIVE mg/dL   Protein, ur NEGATIVE NEGATIVE mg/dL  Nitrite NEGATIVE NEGATIVE    Leukocytes, UA LARGE (A) NEGATIVE   RBC / HPF 0-5 0 - 5 RBC/hpf   WBC, UA 6-30 0 - 5 WBC/hpf   Bacteria, UA RARE (A) NONE SEEN   Squamous Epithelial / LPF 0-5 (A) NONE SEEN   WBC Clumps PRESENT    Mucus PRESENT    Hyaline Casts, UA PRESENT     Dg Chest Port 1 View  Result Date: 09/09/2017 CLINICAL DATA:  Fatigue and weakness EXAM: PORTABLE CHEST 1 VIEW COMPARISON:  March 02, 2014 FINDINGS: There is no edema or consolidation. The heart is upper normal in size with pulmonary vascularity within normal limits. There is aortic atherosclerosis. There are surgical clips over the right upper hemithorax, stable. No evident bone lesions. IMPRESSION: Aortic atherosclerosis. No edema or consolidation. Stable cardiac silhouette. Aortic Atherosclerosis (ICD10-I70.0). Electronically Signed   By: Lowella Grip III M.D.   On: 09/09/2017 07:13   US Abdomen Limited Ruq  Result Date: 09/09/2017 CLINICAL DATA:  Increased bilirubin.  History of gallstones. EXAM: ULTRASOUND ABDOMEN LIMITED RIGHT UPPER QUADRANT COMPARISON:  Ultrasound kidneys 10/15/2016 FINDINGS: Gallbladder: Cholelithiasis with multiple stones and sludge filling the gallbladder. Largest stone is measured at 1.6 cm. Gallbladder wall thickness is mildly increased at 3.4 mm. Murphy's sign is negative. Common bile duct: Diameter: Dilated at 8.6 mm. No cause of obstruction identified but the entire duct is not visualized. Liver: No focal lesion identified. Within normal limits in parenchymal echogenicity. Intrahepatic bile duct dilatation is present. Portal vein is patent on color Doppler imaging with normal direction of blood flow towards the liver. IMPRESSION: 1. Cholelithiasis with sludge and gallbladder wall thickening. Changes may represent cholecystitis although Murphy's sign is negative. 2. Dilated intra and extrahepatic bile ducts suspicious for obstruction. No stone is identified but the entire common bile duct is not visualized. Electronically  Signed   By: Lucienne Capers M.D.   On: 09/09/2017 03:02   Review of Systems  Constitutional: Positive for fatigue. Negative for fever.  HENT: Positive for tinnitus. Negative for ear pain.   Respiratory: Negative for shortness of breath.   Cardiovascular: Negative for chest pain.  Gastrointestinal: Negative for abdominal pain, nausea and vomiting.  Genitourinary: Negative for dysuria.  Neurological: Negative for dizziness, weakness, numbness and headaches.  All other systems reviewed and are negative.  Blood pressure (!) 115/49, pulse 71, temperature 97.7 F (36.5 C), temperature source Oral, resp. rate 15, SpO2 98 %.  Physical Exam  Constitutional: She is oriented to person, place, and time. She appears well-developed and well-nourished.  Head: Normocephalic and atraumatic.  Ears: Normal auricles bilaterally. A small amount of cerumen is noted within the right ear canal. No insect or other foreign body is noted. Both tympanic membranes are intact and mobile. No mediate effusion is noted today.  Eyes: EOM are normal. Pupils are equal, round, and reactive to light. Scleral icterus is present.  Nose: Normal mucosa. The septum and turbinates are normal.  Mouth: Normal mucosa. No suspicious mass or lesion.  Neck: No significant lymphadenopathy or mass. The trachea is midline. No stridor is noted.  Cardiovascular: Normal rate and regular rhythm.  Pulmonary/Chest: Effort normal and breath sounds normal.  Abdominal: Soft. There is no tenderness.  Neurological: She is alert and oriented to person, place, and time.  Skin: Skin is warm and dry.  Nursing note and vitals reviewed.  Assessment/Plan: No insect or other foreign bodies noted within the patient's ear canals.  -  Subjective tinnitus, likely  secondary to hearing loss. - The physical findings are reviewed with the patient. She is reassured and no insect is noted in her ear canals. - The patient is encouraged to call my office with any  questions or concerns.  Tiffiney Sparrow W Kathia Covington 09/09/2017, 10:48 AM

## 2017-09-09 NOTE — Progress Notes (Addendum)
PROGRESS NOTE    Chelsey Savage  KGM:[id number] DOB: 03/21/1941 DOA: 09/03/2017 PCP: Minette Brine   Brief Narrative: Patient is a 77 year old female with past medical history of hypertension, hypothyroidism, chronic kidney disease, liver abscess, history of breast cancer status post right mastectomy who presented to the emergency department with complaints of fatigue and weakness.  Patient also reported of having black tarry stools.  She was found to be icteric.  Her liver enzymes were elevated on presentation with cholestatic pattern.  Ultrasound of the gallbladder showed intrahepatic and extrahepatic biliary dilatation with possible distal obstruction.  Gallstones were present.  Patient denies any abdominal pain.  Patient was admitted for further workup for jaundice and possible GI bleed.  GI consulted. Patient also found to have profound acute kidney injury on CKD.  Assessment & Plan:   Principal Problem:   Jaundice Active Problems:   Hypertension   Hypothyroid   Acute kidney injury superimposed on chronic kidney disease ([place])   ARF (acute renal failure) (HCC)  Jaundice/gallstones/cholestatic pattern: Right upper quadrant sonogram showed possible distal obstruction with external and internal biliary duct dilation. Denies any abdomen..  Elevated liver enzymes with cholestatic pattern.  Started on Zosyn. Ordered MRCP.  GI consulted.  GI recommendation after MRCP is done. She also has history of recurrent dysphagia now resolved with PPI.Marland Kitchen  Acute kidney injury on CKD: Last creatinine in 2017 was 1.6 which could be her baseline.  She follows with nephrologist as an outpatient.  Found to be severely dehydrated with acute kidney injury on presentation.  Patient was having severe loss of appetite and decreased oral intake at home.  Started on IV fluids.  Kidney function improving.  ARB and hydrochlorothiazide on hold.  Anemia with possible GI bleed: Started on Protonix.  Reported of having  black tarry stools at home.  FOBT done in the emergency department was positive.  We will continue to monitor H&H.  Hypertension: We will continue to monitor her blood pressure.  Currently blood pressure stable.  Hypothyroidism: Continue Synthyroid.    Left ear ache: Patient suspected of having an insect inside her left ear.  ENT consulted this morning.  Found to have just a small wax material in the left ear..  DVT prophylaxis: SCD Code Status: Full Family Communication: None present at the bedside Disposition Plan: Home, unknown time at present   Consultants: Gastroenterology  Procedures: None  Antimicrobials: Zosyn since 09/09/17  Subjective: Patient seen and examined the bedside this morning in the emergency department.  Was complaining of dry mouth, severe generalized weakness.  Denies any abdominal pain.  Found to be icteric.  Objective: Vitals:   09/09/17 1215 09/09/17 1300 09/09/17 1345 09/09/17 1515  BP: 110/65 (!) 126/56 98/74 103/66  Pulse: 67 69 62 71  Resp:  (!) 30 16 17   Temp:      TempSrc:      SpO2: 95% 97% 96% 94%    Intake/Output Summary (Last 24 hours) at 09/09/2017 1558 Last data filed at 09/09/2017 1530 Gross per 24 hour  Intake 100 ml  Output -  Net 100 ml   There were no vitals filed for this visit.  Examination:  General exam: Appears calm  ,Not in obvious  distress,average built, generalized weakness, dehydrated Respiratory system: Bilateral equal air entry, normal vesicular breath sounds, no wheezes or crackles  Cardiovascular system: S1 & S2 heard, RRR. No JVD, murmurs, rubs, gallops or clicks. No pedal edema. Gastrointestinal system: Abdomen mildly distended, soft and nontender. No  organomegaly or masses felt. Normal bowel sounds heard. Central nervous system: Alert and oriented. No focal neurological deficits. Extremities: No edema, no clubbing ,no cyanosis, distal peripheral pulses palpable. Skin: No rashes, lesions or ulcers ,no  pallor Icterus present Psychiatry: Judgement and insight appear normal. Mood & affect appropriate.     Data Reviewed: I have personally reviewed following labs and imaging studies  CBC: Recent Labs  Lab 09/09/17 0102 09/09/17 1223  WBC 12.5* 9.9  NEUTROABS 9.0* 7.4  HGB 9.1* 8.6*  HCT 25.7* 24.6*  MCV 79.1 79.1  PLT 390 166   Basic Metabolic Panel: Recent Labs  Lab 09/09/17 0102 09/09/17 1223  NA 140 143  K 4.4 4.5  CL 109 115*  CO2 19* 18*  GLUCOSE 109* 105*  BUN 48* 44*  CREATININE 3.00* 2.77*  CALCIUM 9.7 9.2   GFR: CrCl cannot be calculated (Unknown ideal weight.). Liver Function Tests: Recent Labs  Lab 09/09/17 0102 09/09/17 1223  AST 116* 92*  ALT 129* 108*  ALKPHOS 299* 282*  BILITOT 15.3* 14.4*  PROT 7.3 6.7  ALBUMIN 2.6* 2.4*   Recent Labs  Lab 09/09/17 0102  LIPASE 23   No results for input(s): AMMONIA in the last 168 hours. Coagulation Profile: No results for input(s): INR, PROTIME in the last 168 hours. Cardiac Enzymes: No results for input(s): CKTOTAL, CKMB, CKMBINDEX, TROPONINI in the last 168 hours. BNP (last 3 results) No results for input(s): PROBNP in the last 8760 hours. HbA1C: No results for input(s): HGBA1C in the last 72 hours. CBG: Recent Labs  Lab 09/09/17 0104 09/09/17 0642 09/09/17 1428  GLUCAP 102* 92 89   Lipid Profile: No results for input(s): CHOL, HDL, LDLCALC, TRIG, CHOLHDL, LDLDIRECT in the last 72 hours. Thyroid Function Tests: Recent Labs    09/09/17 0102  TSH 0.542   Anemia Panel: No results for input(s): VITAMINB12, FOLATE, FERRITIN, TIBC, IRON, RETICCTPCT in the last 72 hours. Sepsis Labs: No results for input(s): PROCALCITON, LATICACIDVEN in the last 168 hours.  No results found for this or any previous visit (from the past 240 hour(s)).       Radiology Studies: Dg Chest Port 1 View  Result Date: 09/09/2017 CLINICAL DATA:  Fatigue and weakness EXAM: PORTABLE CHEST 1 VIEW COMPARISON:   March 02, 2014 FINDINGS: There is no edema or consolidation. The heart is upper normal in size with pulmonary vascularity within normal limits. There is aortic atherosclerosis. There are surgical clips over the right upper hemithorax, stable. No evident bone lesions. IMPRESSION: Aortic atherosclerosis. No edema or consolidation. Stable cardiac silhouette. Aortic Atherosclerosis (ICD10-I70.0). Electronically Signed   By: Lowella Grip III M.D.   On: 09/09/2017 07:13   US Abdomen Limited Ruq  Result Date: 09/09/2017 CLINICAL DATA:  Increased bilirubin.  History of gallstones. EXAM: ULTRASOUND ABDOMEN LIMITED RIGHT UPPER QUADRANT COMPARISON:  Ultrasound kidneys 10/15/2016 FINDINGS: Gallbladder: Cholelithiasis with multiple stones and sludge filling the gallbladder. Largest stone is measured at 1.6 cm. Gallbladder wall thickness is mildly increased at 3.4 mm. Murphy's sign is negative. Common bile duct: Diameter: Dilated at 8.6 mm. No cause of obstruction identified but the entire duct is not visualized. Liver: No focal lesion identified. Within normal limits in parenchymal echogenicity. Intrahepatic bile duct dilatation is present. Portal vein is patent on color Doppler imaging with normal direction of blood flow towards the liver. IMPRESSION: 1. Cholelithiasis with sludge and gallbladder wall thickening. Changes may represent cholecystitis although Murphy's sign is negative. 2. Dilated intra and extrahepatic bile  ducts suspicious for obstruction. No stone is identified but the entire common bile duct is not visualized. Electronically Signed   By: Lucienne Capers M.D.   On: 09/09/2017 03:02        Scheduled Meds: . levothyroxine  50 mcg Intravenous QAC breakfast  . pantoprazole (PROTONIX) IV  40 mg Intravenous Q12H   Continuous Infusions: . sodium chloride 125 mL/hr at 09/09/17 1050  . piperacillin-tazobactam (ZOSYN)  IV Stopped (09/09/17 1530)     LOS: 0 days    Time spent:     Marene Lenz, MD [facility] Pager [phone]  If 7PM-7AM, please contact night-coverage [email or web address] Password [facility] 09/09/2017, 3:58 PM

## 2017-09-09 NOTE — ED Provider Notes (Signed)
[facility] DEPT Provider Note   CSN: [id number] Arrival date & time: 09/03/2017  2042     History   Chief Complaint Chief Complaint  Patient presents with  . Fatigue  . decreased appetite    HPI Chelsey Savage is a 77 y.o. female.  HPI  77 year old female presents with a chief complaint of fatigue and no appetite.  She states the no appetite has been going on for the last 2 or 3 days.  She is still eating but is eating less.  She states she just does not feel hungry.  She is also been feeling fatigued since her last ED visit about 10 days ago.  She states she just has no energy but denies focal weakness.  She denies any pain including no headache, chest pain, shortness of breath, or abdominal pain.  She has not had vomiting or diarrhea.  No urinary symptoms.  She states the trouble she had with swallowing food has resolved since being put on Prilosec.  She also is complaining of right ear buzzing that is intermittent throughout the day today.  No ear pain or dizziness.  On exam, patient's eyes appear to have scleral icterus, patient has not noticed this and the son at the bedside states he has noticed this only today.  Past Medical History:  Diagnosis Date  . Allergy   . Breast cancer ([place])    right  . Chronic knee pain    right  . Colon polyp   . GERD (gastroesophageal reflux disease)   . Hypertension   . Hypothyroidism   . Liver abscess   . Vertigo     Patient Active Problem List   Diagnosis Date Noted  . Jaundice 09/09/2017  . Other dysphagia 03/08/2014  . Liver abscess 04/02/2012  . Acute kidney injury ([place]) 03/27/2012  . Hypertension 03/26/2012  . Hypothyroid 03/26/2012  . UTI (lower urinary tract infection) 03/26/2012  . Hypokalemia 03/26/2012  . Cholelithiasis 03/26/2012    Past Surgical History:  Procedure Laterality Date  . HEEL SPUR SURGERY Right   . MASTECTOMY Right 1979  . TONSILLECTOMY      OB History    No data  available       Home Medications    Prior to Admission medications   Medication Sig Start Date End Date Taking? Authorizing Provider  acetaminophen (TYLENOL) 650 MG CR tablet Take 1,300 mg by mouth daily as needed for pain.   Yes [provider]  Calcium Carbonate-Vitamin D (CALTRATE 600+D) 600-400 MG-UNIT per chew tablet Chew 1 tablet by mouth daily.    Yes [provider]  cetirizine (ZYRTEC) 10 MG tablet Take 10 mg by mouth daily.   Yes [provider]  levothyroxine (SYNTHROID, LEVOTHROID) 100 MCG tablet Take 100 mcg by mouth daily before breakfast.   Yes [provider]  multivitamin-lutein (OCUVITE-LUTEIN) CAPS capsule Take 1 capsule by mouth daily.   Yes [provider]  omeprazole (PRILOSEC) 20 MG capsule Take 1 capsule (20 mg total) by mouth daily. 08/30/17  Yes Isla Pence, MD  potassium chloride (MICRO-K) 10 MEQ CR capsule Take 10 mEq by mouth 2 (two) times daily.   Yes [provider]  sodium chloride (OCEAN) 0.65 % SOLN nasal spray Place 2 sprays into both nostrils as needed for congestion.   Yes [provider]  valsartan-hydrochlorothiazide (DIOVAN-HCT) 160-25 MG per tablet Take 1 tablet by mouth daily.  01/03/14  Yes [provider]  Family History Family History  Problem Relation Age of Onset  . Anuerysm Mother        type unknown  . Diabetes Son        x 2    Social History Social History   Tobacco Use  . Smoking status: Former Smoker    Types: Cigarettes    Last attempt to quit: 08/16/1997    Years since quitting: 20.0  . Smokeless tobacco: Never Used  Substance Use Topics  . Alcohol use: No  . Drug use: No     Allergies   Advil [ibuprofen]; Aleve [naproxen sodium]; and Naproxen   Review of Systems Review of Systems  Constitutional: Positive for fatigue. Negative for fever.  HENT: Positive for tinnitus. Negative for ear pain.   Respiratory: Negative for shortness of breath.    Cardiovascular: Negative for chest pain.  Gastrointestinal: Negative for abdominal pain, nausea and vomiting.  Genitourinary: Negative for dysuria.  Neurological: Negative for dizziness, weakness, numbness and headaches.  All other systems reviewed and are negative.    Physical Exam Updated Vital Signs BP (!) 102/39 (BP Location: Left Arm)   Pulse 61   Temp 97.7 F (36.5 C) (Oral)   Resp 17   SpO2 90%   Physical Exam  Constitutional: She is oriented to person, place, and time. She appears well-developed and well-nourished.  HENT:  Head: Normocephalic and atraumatic.  Right Ear: Tympanic membrane and external ear normal.  Left Ear: Tympanic membrane and external ear normal.  Nose: Nose normal.  Small insect sitting on TM, does not move and is laying with legs facing canal. Appears dead. TM intact.  Eyes: EOM are normal. Pupils are equal, round, and reactive to light. Right eye exhibits no discharge. Left eye exhibits no discharge. Scleral icterus is present.  Cardiovascular: Normal rate and regular rhythm.  Murmur heard. Pulmonary/Chest: Effort normal and breath sounds normal.  Abdominal: Soft. There is no tenderness.  Neurological: She is alert and oriented to person, place, and time.  CN 3-12 grossly intact. 5/5 strength in all 4 extremities. Grossly normal sensation. Normal finger to nose.   Skin: Skin is warm and dry.  Nursing note and vitals reviewed.    ED Treatments / Results  Labs (all labs ordered are listed, but only abnormal results are displayed) Labs Reviewed  CBC WITH DIFFERENTIAL/PLATELET - Abnormal; Notable for the following components:      Result Value   WBC 12.5 (*)    RBC 3.25 (*)    Hemoglobin 9.1 (*)    HCT 25.7 (*)    RDW 16.3 (*)    Neutro Abs 9.0 (*)    Monocytes Absolute 1.5 (*)    All other components within normal limits  COMPREHENSIVE METABOLIC PANEL - Abnormal; Notable for the following components:   CO2 19 (*)    Glucose, Bld 109  (*)    BUN 48 (*)    Creatinine, Ser 3.00 (*)    Albumin 2.6 (*)    AST 116 (*)    ALT 129 (*)    Alkaline Phosphatase 299 (*)    Total Bilirubin 15.3 (*)    GFR calc non Af Amer 14 (*)    GFR calc Af Amer 16 (*)    All other components within normal limits  CBG MONITORING, ED - Abnormal; Notable for the following components:   Glucose-Capillary 102 (*)    All other components within normal limits  POC OCCULT BLOOD, ED - Abnormal; Notable for  the following components:   Fecal Occult Bld POSITIVE (*)    All other components within normal limits  TSH  LIPASE, BLOOD  URINALYSIS, ROUTINE W REFLEX MICROSCOPIC    EKG  EKG Interpretation  Date/Time:  Friday September 09 2017 00:42:05 EST Ventricular Rate:  66 PR Interval:    QRS Duration: 85 QT Interval:  410 QTC Calculation: 430 R Axis:   2 Text Interpretation:  Sinus rhythm Abnormal R-wave progression, early transition no significant change since 2017 Confirmed by Sherwood Gambler [phone]) on 09/09/2017 1:38:19 AM       Radiology US Abdomen Limited Ruq  Result Date: 09/09/2017 CLINICAL DATA:  Increased bilirubin.  History of gallstones. EXAM: ULTRASOUND ABDOMEN LIMITED RIGHT UPPER QUADRANT COMPARISON:  Ultrasound kidneys 10/15/2016 FINDINGS: Gallbladder: Cholelithiasis with multiple stones and sludge filling the gallbladder. Largest stone is measured at 1.6 cm. Gallbladder wall thickness is mildly increased at 3.4 mm. Murphy's sign is negative. Common bile duct: Diameter: Dilated at 8.6 mm. No cause of obstruction identified but the entire duct is not visualized. Liver: No focal lesion identified. Within normal limits in parenchymal echogenicity. Intrahepatic bile duct dilatation is present. Portal vein is patent on color Doppler imaging with normal direction of blood flow towards the liver. IMPRESSION: 1. Cholelithiasis with sludge and gallbladder wall thickening. Changes may represent cholecystitis although Murphy's sign is negative.  2. Dilated intra and extrahepatic bile ducts suspicious for obstruction. No stone is identified but the entire common bile duct is not visualized. Electronically Signed   By: Lucienne Capers M.D.   On: 09/09/2017 03:02    Procedures Procedures (including critical care time)  Medications Ordered in ED Medications  sodium chloride 0.9 % bolus 1,000 mL (0 mLs Intravenous Stopped 09/09/17 0419)     Initial Impression / Assessment and Plan / ED Course  I have reviewed the triage vital signs and the nursing notes.  Pertinent labs & imaging results that were available during my care of the patient were reviewed by me and considered in my medical decision making (see chart for details).     No acute hemodynamic changes.  Given IV fluids.  Has new, nonpainful jaundice.  Likely has an obstruction.  Right upper quadrant ultrasound shows a gallstone but cannot fully visualize the biliary tree.  I think MRCP would be more helpful than CT scan, will admit for fluids and monitoring and for the MRCP.  Given no pain or fever, my suspicion of cholangitis or cholecystitis is low.  Of note, she does have what appears to be a dead insect lying against her right tympanic membrane.  This was unable to be removed with flushing and so she will probably need ENT consult either while in the hospital or urgently as a referral.  Dr. Hal Hope to admit.  Final Clinical Impressions(s) / ED Diagnoses   Final diagnoses:  Elevated bilirubin  Acute kidney injury superimposed on chronic kidney disease ([place])  Foreign body of right ear, initial encounter    ED Discharge Orders    None       Sherwood Gambler, MD 09/09/17 [id number]

## 2017-09-09 NOTE — ED Notes (Signed)
RN on shift before did not draw cultures prior to hanging antibiotics.

## 2017-09-10 ENCOUNTER — Inpatient Hospital Stay (HOSPITAL_COMMUNITY): Payer: Medicare Other

## 2017-09-10 DIAGNOSIS — K828 Other specified diseases of gallbladder: Secondary | ICD-10-CM

## 2017-09-10 DIAGNOSIS — K8050 Calculus of bile duct without cholangitis or cholecystitis without obstruction: Secondary | ICD-10-CM

## 2017-09-10 DIAGNOSIS — R945 Abnormal results of liver function studies: Secondary | ICD-10-CM

## 2017-09-10 LAB — COMPREHENSIVE METABOLIC PANEL
ALK PHOS: 236 U/L — AB (ref 38–126)
ALT: 92 U/L — ABNORMAL HIGH (ref 14–54)
ANION GAP: 8 (ref 5–15)
AST: 77 U/L — ABNORMAL HIGH (ref 15–41)
Albumin: 2.2 g/dL — ABNORMAL LOW (ref 3.5–5.0)
BUN: 39 mg/dL — ABNORMAL HIGH (ref 6–20)
CALCIUM: 8.9 mg/dL (ref 8.9–10.3)
CO2: 19 mmol/L — AB (ref 22–32)
Chloride: 115 mmol/L — ABNORMAL HIGH (ref 101–111)
Creatinine, Ser: 2.63 mg/dL — ABNORMAL HIGH (ref 0.44–1.00)
GFR calc non Af Amer: 17 mL/min — ABNORMAL LOW (ref >60)
GFR, EST AFRICAN AMERICAN: 19 mL/min — AB (ref >60)
Glucose, Bld: 91 mg/dL (ref 65–99)
POTASSIUM: 4.6 mmol/L (ref 3.5–5.1)
SODIUM: 142 mmol/L (ref 135–145)
Total Bilirubin: 13.6 mg/dL — ABNORMAL HIGH (ref 0.3–1.2)
Total Protein: 6 g/dL — ABNORMAL LOW (ref 6.5–8.1)

## 2017-09-10 LAB — CBC WITH DIFFERENTIAL/PLATELET
Basophils Absolute: 0 10*3/uL (ref 0.0–0.1)
Basophils Relative: 0 %
EOS ABS: 0.1 10*3/uL (ref 0.0–0.7)
EOS PCT: 1 %
HCT: 24.2 % — ABNORMAL LOW (ref 36.0–46.0)
HEMOGLOBIN: 8.4 g/dL — AB (ref 12.0–15.0)
LYMPHS ABS: 1.1 10*3/uL (ref 0.7–4.0)
Lymphocytes Relative: 13 %
MCH: 27.7 pg (ref 26.0–34.0)
MCHC: 34.7 g/dL (ref 30.0–36.0)
MCV: 79.9 fL (ref 78.0–100.0)
MONO ABS: 1.3 10*3/uL — AB (ref 0.1–1.0)
MONOS PCT: 15 %
Neutro Abs: 6.3 10*3/uL (ref 1.7–7.7)
Neutrophils Relative %: 71 %
Platelets: 332 10*3/uL (ref 150–400)
RBC: 3.03 MIL/uL — ABNORMAL LOW (ref 3.87–5.11)
RDW: 16.8 % — AB (ref 11.5–15.5)
WBC: 8.8 10*3/uL (ref 4.0–10.5)

## 2017-09-10 LAB — GLUCOSE, CAPILLARY
Glucose-Capillary: 87 mg/dL (ref 65–99)
Glucose-Capillary: 88 mg/dL (ref 65–99)
Glucose-Capillary: 107 mg/dL — ABNORMAL HIGH (ref 65–99)

## 2017-09-10 MED ORDER — LEVOTHYROXINE SODIUM 100 MCG IV SOLR
50.0000 ug | INTRAVENOUS | Status: DC
  Administered 2017-09-11: 50 ug via INTRAVENOUS
  Filled 2017-09-10: qty 5

## 2017-09-10 MED ORDER — ALUM & MAG HYDROXIDE-SIMETH 200-200-20 MG/5ML PO SUSP
30.0000 mL | Freq: Four times a day (QID) | ORAL | Status: DC | PRN
  Administered 2017-09-10: 30 mL via ORAL
  Filled 2017-09-10: qty 30

## 2017-09-10 MED ORDER — SODIUM CHLORIDE 0.9 % IV SOLN
INTRAVENOUS | Status: AC
  Administered 2017-09-10 – 2017-09-11 (×2): via INTRAVENOUS

## 2017-09-10 MED ORDER — LORAZEPAM 2 MG/ML IJ SOLN: 1.0000 mg | Freq: Once | INTRAMUSCULAR | Status: DC

## 2017-09-10 MED ORDER — LORAZEPAM 2 MG/ML IJ SOLN
1.0000 mg | Freq: Once | INTRAMUSCULAR | Status: AC
  Administered 2017-09-10: 1 mg via INTRAVENOUS
  Filled 2017-09-10: qty 1

## 2017-09-10 NOTE — Progress Notes (Signed)
PROGRESS NOTE    Chelsey Savage  MCN:[id number] DOB: March 07, 1941 DOA: 08/26/2017 PCP: Minette Brine   Brief Narrative: Patient is a 77 year old female with past medical history of hypertension, hypothyroidism, chronic kidney disease, liver abscess, history of breast cancer status post right mastectomy who presented to the emergency department with complaints of fatigue and weakness.  Patient also reported of having black tarry stools.  She was found to be icteric.  Her liver enzymes were elevated on presentation with cholestatic pattern.  Ultrasound of the gallbladder showed intrahepatic and extrahepatic biliary dilatation with possible distal obstruction.  Gallstones were present.  Patient denies any abdominal pain.  Patient was admitted for further workup for jaundice and possible GI bleed.  GI consulted. Patient also found to have profound acute kidney injury on CKD. MRI done today shows possible gallbladder mass.  Assessment & Plan:   Principal Problem:   Jaundice Active Problems:   Hypertension   Hypothyroid   Acute kidney injury superimposed on chronic kidney disease ([place])   ARF (acute renal failure) ([facility])   Gallbladder mass  Jaundice/gallstones/cholestatic pattern/gallbladder mass:   MRCP report:Large 7 x 8 x 9 cm mass centered in the inferior right liver, incorporating the mid gallbladder and extending anteriorly and cranially up into the medial segment of the left liver. Mass obstructs the right and left biliary ducts at the level of the confluence and the gallbladder fundus is distended and stone filled. 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 Hepatoduodenal ligament lymphadenopathy versus direct tumor extension into the hepatoduodenal ligament.  Right upper quadrant sonogram :Possible distal obstruction  with external and internal biliary duct dilation.  With these findings, will wait for further recommendation from GI.  Will proceed with possible IR guided biopsy if needed.  Patient denies any abdomen pain.  Elevated liver enzymes with cholestatic pattern.   Started on Zosyn. She also has history of recurrent dysphagia now resolved with PPI.Marland Kitchen  Acute kidney injury on OQH:[id number]. Last creatinine in 2017 was 1.6 which could be her baseline.  She follows with nephrologist as an outpatient.  Found to be severely dehydrated with acute kidney injury on presentation.  Patient was having severe loss of appetite and decreased oral intake at home.  Started on IV fluids.    ARB and hydrochlorothiazide on hold.  Anemia with possible GI bleed: Started on Protonix.  Reported of having black tarry stools at home.  FOBT done in the emergency department was positive.  We will continue to monitor H&H.  Hypertension: We will continue to monitor her blood pressure.  Currently blood pressure stable.  Hypothyroidism: Continue Synthyroid.    Left ear ache: Patient suspected of having an insect inside her left ear.  ENT consulted and  found to have just a small wax material in the left ear..  DVT prophylaxis: SCD Code Status: Full Family Communication: None present at the bedside Disposition Plan: Home, unknown time  Consultants: Gastroenterology  Procedures: None  Antimicrobials: Zosyn since 09/09/17  Subjective: Patient seen and examined the bedside this morning.  Appears little better today.  Generalized weakness has improved.  He still complains of dry mouth.  Objective: Vitals:   09/09/17 1515 09/09/17 1629 09/09/17 2121 09/10/17 0604  BP: 103/66 (!) 127/51 130/60 (!) 104/41  Pulse: 71 69 74 70  Resp: 17 18 18 18   Temp:  98.4 F (36.9 C) 97.7 F (36.5 C) 99 F (  37.2 C)  TempSrc:  Oral Oral Oral  SpO2: 94% 100% 100% 98%  Weight:  96.1 kg (211 lb 12.8 oz)    Height:  5\' 5"  (1.651 m)       Intake/Output Summary (Last 24 hours) at 09/10/2017 1335 Last data filed at 09/10/2017 0900 Gross per 24 hour  Intake 2072.5 ml  Output 700 ml  Net 1372.5 ml   Filed Weights   09/09/17 1629  Weight: 96.1 kg (211 lb 12.8 oz)    Examination:  General exam: Appears calm and comfortable ,Not in distress,average built Respiratory system: Bilateral equal air entry, normal vesicular breath sounds, no wheezes or crackles  Cardiovascular system: S1 & S2 heard, RRR. No JVD, murmurs, rubs, gallops or clicks.  Gastrointestinal system: Abdomen is mildly distended, soft and nontender. No organomegaly or masses felt. Normal bowel sounds heard. Central nervous system: Alert and oriented. No focal neurological deficits. Extremities: No edema, no clubbing ,no cyanosis, distal peripheral pulses palpable. Skin: No cyanosis,No pallor,No Rash,No Ulcer Icterus present  Psychiatry: Judgement and insight appear normal. Mood & affect appropriate.  GU: No Foley    Data Reviewed: I have personally reviewed following labs and imaging studies  CBC: Recent Labs  Lab 09/09/17 0102 09/09/17 1223 09/10/17 0457  WBC 12.5* 9.9 8.8  NEUTROABS 9.0* 7.4 6.3  HGB 9.1* 8.6* 8.4*  HCT 25.7* 24.6* 24.2*  MCV 79.1 79.1 79.9  PLT 390 361 735   Basic Metabolic Panel: Recent Labs  Lab 09/09/17 0102 09/09/17 1223 09/10/17 0457  NA 140 143 142  K 4.4 4.5 4.6  CL 109 115* 115*  CO2 19* 18* 19*  GLUCOSE 109* 105* 91  BUN 48* 44* 39*  CREATININE 3.00* 2.77* 2.63*  CALCIUM 9.7 9.2 8.9   GFR: Estimated Creatinine Clearance: 20.9 mL/min (A) (by C-G formula based on SCr of 2.63 mg/dL (H)). Liver Function Tests: Recent Labs  Lab 09/09/17 0102 09/09/17 1223 09/10/17 0457  AST 116* 92* 77*  ALT 129* 108* 92*  ALKPHOS 299* 282* 236*  BILITOT 15.3* 14.4* 13.6*  PROT 7.3 6.7 6.0*  ALBUMIN 2.6* 2.4* 2.2*   Recent Labs  Lab 09/09/17 0102  LIPASE 23   No results for input(s): AMMONIA in the last 168  hours. Coagulation Profile: No results for input(s): INR, PROTIME in the last 168 hours. Cardiac Enzymes: No results for input(s): CKTOTAL, CKMB, CKMBINDEX, TROPONINI in the last 168 hours. BNP (last 3 results) No results for input(s): PROBNP in the last 8760 hours. HbA1C: No results for input(s): HGBA1C in the last 72 hours. CBG: Recent Labs  Lab 09/09/17 0642 09/09/17 1428 09/09/17 1736 09/09/17 2259 09/10/17 0735  GLUCAP 92 89 76 94 87   Lipid Profile: No results for input(s): CHOL, HDL, LDLCALC, TRIG, CHOLHDL, LDLDIRECT in the last 72 hours. Thyroid Function Tests: Recent Labs    09/09/17 0102  TSH 0.542   Anemia Panel: No results for input(s): VITAMINB12, FOLATE, FERRITIN, TIBC, IRON, RETICCTPCT in the last 72 hours. Sepsis Labs: No results for input(s): PROCALCITON, LATICACIDVEN in the last 168 hours.  No results found for this or any previous visit (from the past 240 hour(s)).       Radiology Studies: Mr Abdomen Mrcp Wo Contrast  Result Date: 09/10/2017 CLINICAL DATA:  Abnormal LFTs. EXAM: MRI ABDOMEN WITHOUT CONTRAST  (INCLUDING MRCP) TECHNIQUE: Multiplanar multisequence MR imaging of the abdomen was performed. Heavily T2-weighted images of the biliary and pancreatic ducts were obtained, and three-dimensional MRCP images were rendered by  post processing. COMPARISON:  Ultrasound exam 09/09/2017 CT scan 04/12/2012. FINDINGS: Lower chest: Heart is enlarged. Hepatobiliary: There is marked intra hepatic biliary duct dilatation involving both the right and left hepatic lobes level of obstruction is near the biliary confluence/common duct. Common bile duct in the head of the pancreas is nondilated. Obstructing etiology is a 6.8 x 8.2 x 8.5 cm irregular heterogeneous mass involving the inferior right liver and extending anteriorly and cranially up into the medial segment of the left liver. Mass lesion incorporates the body of the gallbladder. Pancreas: No focal mass lesion.  No dilatation of the main duct. No intraparenchymal cyst. No peripancreatic edema. Spleen:  No splenomegaly. No focal mass lesion. Adrenals/Urinary Tract: No adrenal nodule or mass. 11 mm cyst identified upper pole left kidney. Right kidney unremarkable. Stomach/Bowel: Stomach is nondistended. No gastric wall thickening. No evidence of outlet obstruction. Duodenum is normally positioned as is the ligament of Treitz. Duodenum crosses in close proximity to is likely contiguous with the inferior and medial margin of the liver mass. No small bowel or colonic dilatation within the visualized abdomen. Vascular/Lymphatic: No abdominal aortic aneurysm. 2.2 cm short axis hepatoduodenal ligament lymph node versus direct tumor extension into the porta hepatis. Other:  No intraperitoneal free fluid Musculoskeletal: No abnormal marrow signal within the visualized bony anatomy. IMPRESSION: 1. Study limited by motion artifact and inability to administer intravenous contrast material. Within this limitation, the patient has a large 7 x 8 x 9 cm mass centered in the inferior right liver, incorporating the mid gallbladder and extending anteriorly and cranially up into the medial segment of the left liver. Mass obstructs the right and left biliary ducts at the level of the confluence and the gallbladder fundus is distended and stone filled. 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 2. Relationship between the mass and retroperitoneal anatomy is limited by motion and lack of intravenous contrast. The mass is contiguous with the descending duodenum and duodenal involvement is a concern. No evidence for obstruction at the level of the duodenum. 3. Hepatoduodenal ligament lymphadenopathy versus direct tumor extension into the hepatoduodenal ligament. Electronically Signed    By: Misty Stanley M.D.   On: 09/10/2017 11:58   Mr 3d Recon At Scanner  Result Date: 09/10/2017 CLINICAL DATA:  Abnormal LFTs. EXAM: MRI ABDOMEN WITHOUT CONTRAST  (INCLUDING MRCP) TECHNIQUE: Multiplanar multisequence MR imaging of the abdomen was performed. Heavily T2-weighted images of the biliary and pancreatic ducts were obtained, and three-dimensional MRCP images were rendered by post processing. COMPARISON:  Ultrasound exam 09/09/2017 CT scan 04/12/2012. FINDINGS: Lower chest: Heart is enlarged. Hepatobiliary: There is marked intra hepatic biliary duct dilatation involving both the right and left hepatic lobes level of obstruction is near the biliary confluence/common duct. Common bile duct in the head of the pancreas is nondilated. Obstructing etiology is a 6.8 x 8.2 x 8.5 cm irregular heterogeneous mass involving the inferior right liver and extending anteriorly and cranially up into the medial segment of the left liver. Mass lesion incorporates the body of the gallbladder. Pancreas: No focal mass lesion. No dilatation of the main duct. No intraparenchymal cyst. No peripancreatic edema. Spleen:  No splenomegaly. No focal mass lesion. Adrenals/Urinary Tract: No adrenal nodule or mass. 11 mm cyst identified upper pole left kidney. Right kidney unremarkable. Stomach/Bowel: Stomach is nondistended. No gastric wall thickening. No evidence of outlet obstruction. Duodenum is normally positioned as is  the ligament of Treitz. Duodenum crosses in close proximity to is likely contiguous with the inferior and medial margin of the liver mass. No small bowel or colonic dilatation within the visualized abdomen. Vascular/Lymphatic: No abdominal aortic aneurysm. 2.2 cm short axis hepatoduodenal ligament lymph node versus direct tumor extension into the porta hepatis. Other:  No intraperitoneal free fluid Musculoskeletal: No abnormal marrow signal within the visualized bony anatomy. IMPRESSION: 1. Study limited by motion  artifact and inability to administer intravenous contrast material. Within this limitation, the patient has a large 7 x 8 x 9 cm mass centered in the inferior right liver, incorporating the mid gallbladder and extending anteriorly and cranially up into the medial segment of the left liver. Mass obstructs the right and left biliary ducts at the level of the confluence and the gallbladder fundus is distended and stone filled. 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 2. Relationship between the mass and retroperitoneal anatomy is limited by motion and lack of intravenous contrast. The mass is contiguous with the descending duodenum and duodenal involvement is a concern. No evidence for obstruction at the level of the duodenum. 3. Hepatoduodenal ligament lymphadenopathy versus direct tumor extension into the hepatoduodenal ligament. Electronically Signed   By: Misty Stanley M.D.   On: 09/10/2017 11:58   Dg Chest Port 1 View  Result Date: 09/09/2017 CLINICAL DATA:  Fatigue and weakness EXAM: PORTABLE CHEST 1 VIEW COMPARISON:  March 02, 2014 FINDINGS: There is no edema or consolidation. The heart is upper normal in size with pulmonary vascularity within normal limits. There is aortic atherosclerosis. There are surgical clips over the right upper hemithorax, stable. No evident bone lesions. IMPRESSION: Aortic atherosclerosis. No edema or consolidation. Stable cardiac silhouette. Aortic Atherosclerosis (ICD10-I70.0). Electronically Signed   By: Lowella Grip III M.D.   On: 09/09/2017 07:13   US Abdomen Limited Ruq  Result Date: 09/09/2017 CLINICAL DATA:  Increased bilirubin.  History of gallstones. EXAM: ULTRASOUND ABDOMEN LIMITED RIGHT UPPER QUADRANT COMPARISON:  Ultrasound kidneys 10/15/2016 FINDINGS: Gallbladder: Cholelithiasis with multiple  stones and sludge filling the gallbladder. Largest stone is measured at 1.6 cm. Gallbladder wall thickness is mildly increased at 3.4 mm. Murphy's sign is negative. Common bile duct: Diameter: Dilated at 8.6 mm. No cause of obstruction identified but the entire duct is not visualized. Liver: No focal lesion identified. Within normal limits in parenchymal echogenicity. Intrahepatic bile duct dilatation is present. Portal vein is patent on color Doppler imaging with normal direction of blood flow towards the liver. IMPRESSION: 1. Cholelithiasis with sludge and gallbladder wall thickening. Changes may represent cholecystitis although Murphy's sign is negative. 2. Dilated intra and extrahepatic bile ducts suspicious for obstruction. No stone is identified but the entire common bile duct is not visualized. Electronically Signed   By: Lucienne Capers M.D.   On: 09/09/2017 03:02        Scheduled Meds: . feeding supplement (ENSURE ENLIVE)  237 mL Oral BID BM  . levothyroxine  50 mcg Intravenous QAC breakfast  . pantoprazole (PROTONIX) IV  40 mg Intravenous Q12H   Continuous Infusions: . sodium chloride    . piperacillin-tazobactam (ZOSYN)  IV Stopped (09/10/17 0826)     LOS: 1 day    Time spent: 25 mins    Dimitriy Carreras Jodie Echevaria, MD [facility] Pager [phone]  If 7PM-7AM, please contact night-coverage [email or web address] Password [email or web address] 09/10/2017, 1:35 PM

## 2017-09-10 NOTE — Progress Notes (Signed)
Progress Note   Subjective  Slept well. No GI complaints. ENT consult reviewed.   Objective  Vital signs in last 24 hours: Temp:  [97.7 F (36.5 C)-99 F (37.2 C)] 99 F (37.2 C) (01/26 0604) Pulse Rate:  [62-74] 70 (01/26 0604) Resp:  [16-30] 18 (01/26 0604) BP: (93-130)/(41-74) 104/41 (01/26 0604) SpO2:  [94 %-100 %] 98 % (01/26 0604) Weight:  [211 lb 12.8 oz (96.1 kg)] 211 lb 12.8 oz (96.1 kg) (01/25 1629) Last BM Date: 09/09/17  General: Alert, well-developed, in NAD Heart:  Regular rate and rhythm; no murmurs Chest: Clear to ascultation bilaterally Abdomen:  Soft, nontender and nondistended. Normal bowel sounds, without guarding, and without rebound.   Extremities:  Without edema. Neurologic:  Alert and  oriented x4; grossly normal neurologically. Psych:  Alert and cooperative. Normal mood and affect.  Intake/Output from previous day: 01/25 0701 - 01/26 0700 In: 2122.5 [P.O.:360; I.V.:1562.5; IV Piggyback:200] Out: 400 [Urine:400] Intake/Output this shift: Total I/O In: -  Out: 300 [Urine:300]  Lab Results: Recent Labs    09/09/17 0102 09/09/17 1223 09/10/17 0457  WBC 12.5* 9.9 8.8  HGB 9.1* 8.6* 8.4*  HCT 25.7* 24.6* 24.2*  PLT 390 361 332   BMET Recent Labs    09/09/17 0102 09/09/17 1223 09/10/17 0457  NA 140 143 142  K 4.4 4.5 4.6  CL 109 115* 115*  CO2 19* 18* 19*  GLUCOSE 109* 105* 91  BUN 48* 44* 39*  CREATININE 3.00* 2.77* 2.63*  CALCIUM 9.7 9.2 8.9   LFT Recent Labs    09/10/17 0457  PROT 6.0*  ALBUMIN 2.2*  AST 77*  ALT 92*  ALKPHOS 236*  BILITOT 13.6*    Studies/Results: Dg Chest Port 1 View  Result Date: 09/09/2017 CLINICAL DATA:  Fatigue and weakness EXAM: PORTABLE CHEST 1 VIEW COMPARISON:  March 02, 2014 FINDINGS: There is no edema or consolidation. The heart is upper normal in size with pulmonary vascularity within normal limits. There is aortic atherosclerosis. There are surgical clips over the right upper  hemithorax, stable. No evident bone lesions. IMPRESSION: Aortic atherosclerosis. No edema or consolidation. Stable cardiac silhouette. Aortic Atherosclerosis (ICD10-I70.0). Electronically Signed   By: Lowella Grip III M.D.   On: 09/09/2017 07:13   US Abdomen Limited Ruq  Result Date: 09/09/2017 CLINICAL DATA:  Increased bilirubin.  History of gallstones. EXAM: ULTRASOUND ABDOMEN LIMITED RIGHT UPPER QUADRANT COMPARISON:  Ultrasound kidneys 10/15/2016 FINDINGS: Gallbladder: Cholelithiasis with multiple stones and sludge filling the gallbladder. Largest stone is measured at 1.6 cm. Gallbladder wall thickness is mildly increased at 3.4 mm. Murphy's sign is negative. Common bile duct: Diameter: Dilated at 8.6 mm. No cause of obstruction identified but the entire duct is not visualized. Liver: No focal lesion identified. Within normal limits in parenchymal echogenicity. Intrahepatic bile duct dilatation is present. Portal vein is patent on color Doppler imaging with normal direction of blood flow towards the liver. IMPRESSION: 1. Cholelithiasis with sludge and gallbladder wall thickening. Changes may represent cholecystitis although Murphy's sign is negative. 2. Dilated intra and extrahepatic bile ducts suspicious for obstruction. No stone is identified but the entire common bile duct is not visualized. Electronically Signed   By: Lucienne Capers M.D.   On: 09/09/2017 03:02      Assessment & Plan   1. Biliary dilation, cholelithiasis, thickened GB wall, jaundice, elevated LFTs without abdominal pain. Concerning for biliary obstruction from CBD stone or mass and for possible cholecystitis however unusual without pain. Awaiting  MRCP. Continue Zosyn.   2. AKI on CKD with Cr improving.   3. Anemia with borderline microcytic indices and heme + stool. Address after biliary evaluation.  Hb drifting lower likely due to hydration. No overt GI bleeding noted.     LOS: 1 day   Noland Pizano T. Fuller Plan MD 09/10/2017,  9:46 AM

## 2017-09-11 DIAGNOSIS — R945 Abnormal results of liver function studies: Secondary | ICD-10-CM

## 2017-09-11 DIAGNOSIS — R7989 Other specified abnormal findings of blood chemistry: Secondary | ICD-10-CM

## 2017-09-11 DIAGNOSIS — R935 Abnormal findings on diagnostic imaging of other abdominal regions, including retroperitoneum: Secondary | ICD-10-CM

## 2017-09-11 LAB — CBC WITH DIFFERENTIAL/PLATELET
BASOS ABS: 0 10*3/uL (ref 0.0–0.1)
Basophils Relative: 0 %
EOS PCT: 0 %
Eosinophils Absolute: 0 10*3/uL (ref 0.0–0.7)
HEMATOCRIT: 22.1 % — AB (ref 36.0–46.0)
Hemoglobin: 7.5 g/dL — ABNORMAL LOW (ref 12.0–15.0)
LYMPHS PCT: 14 %
Lymphs Abs: 1.3 10*3/uL (ref 0.7–4.0)
MCH: 27.9 pg (ref 26.0–34.0)
MCHC: 33.9 g/dL (ref 30.0–36.0)
MCV: 82.2 fL (ref 78.0–100.0)
MONO ABS: 1.2 10*3/uL — AB (ref 0.1–1.0)
MONOS PCT: 13 %
NEUTROS ABS: 6.9 10*3/uL (ref 1.7–7.7)
Neutrophils Relative %: 73 %
PLATELETS: 314 10*3/uL (ref 150–400)
RBC: 2.69 MIL/uL — ABNORMAL LOW (ref 3.87–5.11)
RDW: 17.6 % — AB (ref 11.5–15.5)
WBC: 9.5 10*3/uL (ref 4.0–10.5)

## 2017-09-11 LAB — COMPREHENSIVE METABOLIC PANEL
ALBUMIN: 2.1 g/dL — AB (ref 3.5–5.0)
ALK PHOS: 211 U/L — AB (ref 38–126)
ALT: 84 U/L — ABNORMAL HIGH (ref 14–54)
ANION GAP: 7 (ref 5–15)
AST: 88 U/L — AB (ref 15–41)
BILIRUBIN TOTAL: 13 mg/dL — AB (ref 0.3–1.2)
BUN: 35 mg/dL — AB (ref 6–20)
CALCIUM: 8.5 mg/dL — AB (ref 8.9–10.3)
CO2: 18 mmol/L — AB (ref 22–32)
Chloride: 116 mmol/L — ABNORMAL HIGH (ref 101–111)
Creatinine, Ser: 2.61 mg/dL — ABNORMAL HIGH (ref 0.44–1.00)
GFR calc Af Amer: 19 mL/min — ABNORMAL LOW (ref >60)
GFR calc non Af Amer: 17 mL/min — ABNORMAL LOW (ref >60)
GLUCOSE: 105 mg/dL — AB (ref 65–99)
POTASSIUM: 4.3 mmol/L (ref 3.5–5.1)
SODIUM: 141 mmol/L (ref 135–145)
TOTAL PROTEIN: 5.5 g/dL — AB (ref 6.5–8.1)

## 2017-09-11 LAB — GLUCOSE, CAPILLARY
Glucose-Capillary: 82 mg/dL (ref 65–99)
Glucose-Capillary: 85 mg/dL (ref 65–99)
Glucose-Capillary: 92 mg/dL (ref 65–99)

## 2017-09-11 MED ORDER — LEVOTHYROXINE SODIUM 100 MCG PO TABS
100.0000 ug | ORAL_TABLET | Freq: Every day | ORAL | Status: DC
  Administered 2017-09-12 – 2017-09-13 (×2): 100 ug via ORAL
  Filled 2017-09-11 (×2): qty 1

## 2017-09-11 MED ORDER — PIPERACILLIN-TAZOBACTAM 3.375 G IVPB
3.3750 g | Freq: Three times a day (TID) | INTRAVENOUS | Status: DC
  Administered 2017-09-11 – 2017-09-13 (×6): 3.375 g via INTRAVENOUS
  Filled 2017-09-11 (×6): qty 50

## 2017-09-11 MED ORDER — SODIUM CHLORIDE 0.9 % IV SOLN
INTRAVENOUS | Status: AC
  Administered 2017-09-11 – 2017-09-12 (×3): via INTRAVENOUS

## 2017-09-11 NOTE — Progress Notes (Signed)
Pharmacy Antibiotic Note  Chelsey Savage is a 77 y.o. female admitted on 08/23/2017 with obstructive jaundice. Cholelithiasis and dilated intra and extrahepatic bile ducts on ultrasound. Pharmacy has been consulted for Zosyn dosing.  Plan: Adjust Zosyn to 3.375g IV q8h (infuse each dose over 4 hours). Monitor renal function, cultures, clinical course, and duration of therapy.    Height: 5\' 5"  (165.1 cm) Weight: 211 lb 12.8 oz (96.1 kg) IBW/kg (Calculated) : 57  Temp (24hrs), Avg:99 F (37.2 C), Min:98.5 F (36.9 C), Max:99.9 F (37.7 C)  Recent Labs  Lab 09/09/17 0102 09/09/17 1223 09/10/17 0457 09/11/17 0357  WBC 12.5* 9.9 8.8 9.5  CREATININE 3.00* 2.77* 2.63* 2.61*    Estimated Creatinine Clearance: 21 mL/min (A) (by C-G formula based on SCr of 2.61 mg/dL (H)).    Allergies  Allergen Reactions  . Advil [Ibuprofen] Other (See Comments)    Kidney MD said not to take  . Aleve [Naproxen Sodium] Other (See Comments)    Kidney MD said not to take  . Naproxen Other (See Comments)    Kidney MD said not to take    Antimicrobials this admission: 1/25 >> Zosyn >>  Dose adjustments this admission: 1/27: adjusted from 2.25g q6h to 3.375g q8h (extended infusion) for improvement in renal function   Thank you for allowing pharmacy to be a part of this patient's care.  Lindell Spar, PharmD, BCPS Pager: [phone] 09/11/2017 8:37 AM

## 2017-09-11 NOTE — Progress Notes (Signed)
PROGRESS NOTE    Chelsey Savage  ERX:[id number] DOB: March 28, 1941 DOA: 08/23/2017 PCP: Minette Brine   Brief Narrative: Patient is a 77 year old female with past medical history of hypertension, hypothyroidism, chronic kidney disease, liver abscess, history of breast cancer status post right mastectomy who presented to the emergency department with complaints of fatigue and weakness.  Patient also reported of having black tarry stools.  She was found to be icteric.  Her liver enzymes were elevated on presentation with cholestatic pattern.  Ultrasound of the gallbladder showed intrahepatic and extrahepatic biliary dilatation with possible distal obstruction.  Gallstones were present.  Patient denies any abdominal pain.  Patient was admitted for further workup for jaundice and possible GI bleed.  GI consulted. Patient also found to have profound acute kidney injury on CKD. MRCP done on 09/11/17  shows possible gallbladder mass.  Assessment & Plan:   Principal Problem:   Jaundice Active Problems:   Hypertension   Hypothyroid   Acute kidney injury superimposed on chronic kidney disease ([place])   ARF (acute renal failure) ([facility])   Gallbladder mass  Jaundice/gallstones/cholestatic pattern/gallbladder mass:   MRCP report:Large 7 x 8 x 9 cm mass centered in the inferior right liver, incorporating the mid gallbladder and extending anteriorly and cranially up into the medial segment of the left liver. Mass obstructs the right and left biliary ducts at the level of the confluence and the gallbladder fundus is distended and stone filled. 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 Hepatoduodenal ligament lymphadenopathy versus direct tumor extension into the hepatoduodenal ligament.  Right upper quadrant sonogram :Possible distal  obstruction with external and internal biliary duct dilation.  Discussed with GI today. Will proceed with possible IR guided biopsy .  IR will be consulted but may not be available all weekend.  Will do tomorrow  Patient denies any abdomen pain.  Elevated liver enzymes with cholestatic pattern.   Started on Zosyn. She also has history of recurrent dysphagia now resolved with PPI.Marland Kitchen  Acute kidney injury on PJK:DTOIZTIWP. Last creatinine in 2017 was 1.6 which could be her baseline.  She follows with nephrologist as an outpatient.  Found to be severely dehydrated with acute kidney injury on presentation.  Patient was having severe loss of appetite and decreased oral intake at home.  Started on IV fluids.    ARB and hydrochlorothiazide on hold.  Anemia with possible GI bleed: Started on Protonix.  Reported of having black tarry stools at home.  FOBT done in the emergency department was positive.  We will continue to monitor H&H.We will transfuse as necessary  Hypertension: BP now on the lower side. We will continue to monitor.Continue IV fluids  Hypothyroidism: Continue Synthyroid.    Left ear ache: Patient suspected of having an insect inside her left ear.  ENT consulted and  found to have just a small wax material in the left ear..  DVT prophylaxis: SCD Code Status: Full Family Communication: None present at the bedside Disposition Plan: Home, unknown time  Consultants: Gastroenterology  Procedures: None  Antimicrobials: Zosyn since 09/09/17  Subjective: Patient seen and examined the bedside this morning.  I discussed about the MRCP finding with the patient.  She did not express any reaction.  She looks depressed. She denies any abdominal pain.  Generalized weakness has improved.  No more episodes of black stools. Discussed about my plan to consult interventional radiology for the needle  biopsy of the mass.   Objective: Vitals:   09/10/17 1346 09/10/17 2149 09/11/17 0432 09/11/17  1235  BP: (!) 90/38 (!) 101/46 (!) 95/48 (!) 83/32  Pulse: 71 64 94 70  Resp: 16 18 16 17   Temp: 98.5 F (36.9 C) 98.5 F (36.9 C) 99.9 F (37.7 C) 99.2 F (37.3 C)  TempSrc: Oral Oral Oral Oral  SpO2: 98% 96% 94% 98%  Weight:      Height:        Intake/Output Summary (Last 24 hours) at 09/11/2017 1252 Last data filed at 09/11/2017 0848 Gross per 24 hour  Intake -  Output 450 ml  Net -450 ml   Filed Weights   09/09/17 1629  Weight: 96.1 kg (211 lb 12.8 oz)    Examination:  General exam: Appears calm and comfortable ,Not in distress,average built Respiratory system: Bilateral equal air entry, normal vesicular breath sounds, no wheezes or crackles  Cardiovascular system: S1 & S2 heard, RRR. No JVD, murmurs, rubs, gallops or clicks.  Gastrointestinal system: Abdomen is mildly distended, soft and nontender. No organomegaly or masses felt. Normal bowel sounds heard. Central nervous system: Alert and oriented. No focal neurological deficits. Extremities: No edema, no clubbing ,no cyanosis, distal peripheral pulses palpable. Skin: No cyanosis,No pallor,No Rash,No Ulcer Icterus present  Psychiatry: Judgement and insight appear normal. Mood & affect appropriate.  GU: No Foley    Data Reviewed: I have personally reviewed following labs and imaging studies  CBC: Recent Labs  Lab 09/09/17 0102 09/09/17 1223 09/10/17 0457 09/11/17 0357  WBC 12.5* 9.9 8.8 9.5  NEUTROABS 9.0* 7.4 6.3 6.9  HGB 9.1* 8.6* 8.4* 7.5*  HCT 25.7* 24.6* 24.2* 22.1*  MCV 79.1 79.1 79.9 82.2  PLT 390 361 332 211   Basic Metabolic Panel: Recent Labs  Lab 09/09/17 0102 09/09/17 1223 09/10/17 0457 09/11/17 0357  NA 140 143 142 141  K 4.4 4.5 4.6 4.3  CL 109 115* 115* 116*  CO2 19* 18* 19* 18*  GLUCOSE 109* 105* 91 105*  BUN 48* 44* 39* 35*  CREATININE 3.00* 2.77* 2.63* 2.61*  CALCIUM 9.7 9.2 8.9 8.5*   GFR: Estimated Creatinine Clearance: 21 mL/min (A) (by C-G formula based on SCr of 2.61  mg/dL (H)). Liver Function Tests: Recent Labs  Lab 09/09/17 0102 09/09/17 1223 09/10/17 0457 09/11/17 0357  AST 116* 92* 77* 88*  ALT 129* 108* 92* 84*  ALKPHOS 299* 282* 236* 211*  BILITOT 15.3* 14.4* 13.6* 13.0*  PROT 7.3 6.7 6.0* 5.5*  ALBUMIN 2.6* 2.4* 2.2* 2.1*   Recent Labs  Lab 09/09/17 0102  LIPASE 23   No results for input(s): AMMONIA in the last 168 hours. Coagulation Profile: No results for input(s): INR, PROTIME in the last 168 hours. Cardiac Enzymes: No results for input(s): CKTOTAL, CKMB, CKMBINDEX, TROPONINI in the last 168 hours. BNP (last 3 results) No results for input(s): PROBNP in the last 8760 hours. HbA1C: No results for input(s): HGBA1C in the last 72 hours. CBG: Recent Labs  Lab 09/09/17 1736 09/09/17 2259 09/10/17 0735 09/10/17 1337 09/10/17 1752  GLUCAP 76 94 87 88 107*   Lipid Profile: No results for input(s): CHOL, HDL, LDLCALC, TRIG, CHOLHDL, LDLDIRECT in the last 72 hours. Thyroid Function Tests: Recent Labs    09/09/17 0102  TSH 0.542   Anemia Panel: No results for input(s): VITAMINB12, FOLATE, FERRITIN, TIBC, IRON, RETICCTPCT in the last 72 hours. Sepsis Labs: No results for input(s): PROCALCITON, LATICACIDVEN in the last 168 hours.  No results found for this or any previous visit (from the past 240 hour(s)).       Radiology Studies: Mr Abdomen Mrcp Wo Contrast  Result Date: 09/10/2017 CLINICAL DATA:  Abnormal LFTs. EXAM: MRI ABDOMEN WITHOUT CONTRAST  (INCLUDING MRCP) TECHNIQUE: Multiplanar multisequence MR imaging of the abdomen was performed. Heavily T2-weighted images of the biliary and pancreatic ducts were obtained, and three-dimensional MRCP images were rendered by post processing. COMPARISON:  Ultrasound exam 09/09/2017 CT scan 04/12/2012. FINDINGS: Lower chest: Heart is enlarged. Hepatobiliary: There is marked intra hepatic biliary duct dilatation involving both the right and left hepatic lobes level of obstruction  is near the biliary confluence/common duct. Common bile duct in the head of the pancreas is nondilated. Obstructing etiology is a 6.8 x 8.2 x 8.5 cm irregular heterogeneous mass involving the inferior right liver and extending anteriorly and cranially up into the medial segment of the left liver. Mass lesion incorporates the body of the gallbladder. Pancreas: No focal mass lesion. No dilatation of the main duct. No intraparenchymal cyst. No peripancreatic edema. Spleen:  No splenomegaly. No focal mass lesion. Adrenals/Urinary Tract: No adrenal nodule or mass. 11 mm cyst identified upper pole left kidney. Right kidney unremarkable. Stomach/Bowel: Stomach is nondistended. No gastric wall thickening. No evidence of outlet obstruction. Duodenum is normally positioned as is the ligament of Treitz. Duodenum crosses in close proximity to is likely contiguous with the inferior and medial margin of the liver mass. No small bowel or colonic dilatation within the visualized abdomen. Vascular/Lymphatic: No abdominal aortic aneurysm. 2.2 cm short axis hepatoduodenal ligament lymph node versus direct tumor extension into the porta hepatis. Other:  No intraperitoneal free fluid Musculoskeletal: No abnormal marrow signal within the visualized bony anatomy. IMPRESSION: 1. Study limited by motion artifact and inability to administer intravenous contrast material. Within this limitation, the patient has a large 7 x 8 x 9 cm mass centered in the inferior right liver, incorporating the mid gallbladder and extending anteriorly and cranially up into the medial segment of the left liver. Mass obstructs the right and left biliary ducts at the level of the confluence and the gallbladder fundus is distended and stone filled. 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 2.  Relationship between the mass and retroperitoneal anatomy is limited by motion and lack of intravenous contrast. The mass is contiguous with the descending duodenum and duodenal involvement is a concern. No evidence for obstruction at the level of the duodenum. 3. Hepatoduodenal ligament lymphadenopathy versus direct tumor extension into the hepatoduodenal ligament. Electronically Signed   By: Misty Stanley M.D.   On: 09/10/2017 11:58   Mr 3d Recon At Scanner  Result Date: 09/10/2017 CLINICAL DATA:  Abnormal LFTs. EXAM: MRI ABDOMEN WITHOUT CONTRAST  (INCLUDING MRCP) TECHNIQUE: Multiplanar multisequence MR imaging of the abdomen was performed. Heavily T2-weighted images of the biliary and pancreatic ducts were obtained, and three-dimensional MRCP images were rendered by post processing. COMPARISON:  Ultrasound exam 09/09/2017 CT scan 04/12/2012. FINDINGS: Lower chest: Heart is enlarged. Hepatobiliary: There is marked intra hepatic biliary duct dilatation involving both the right and left hepatic lobes level of obstruction is near the biliary confluence/common duct. Common bile duct in the head of the pancreas is nondilated. Obstructing etiology is a 6.8 x 8.2 x 8.5 cm irregular heterogeneous mass involving the inferior right liver and extending anteriorly and cranially up into the medial segment of the  left liver. Mass lesion incorporates the body of the gallbladder. Pancreas: No focal mass lesion. No dilatation of the main duct. No intraparenchymal cyst. No peripancreatic edema. Spleen:  No splenomegaly. No focal mass lesion. Adrenals/Urinary Tract: No adrenal nodule or mass. 11 mm cyst identified upper pole left kidney. Right kidney unremarkable. Stomach/Bowel: Stomach is nondistended. No gastric wall thickening. No evidence of outlet obstruction. Duodenum is normally positioned as is the ligament of Treitz. Duodenum crosses in close proximity to is likely contiguous with the inferior and medial margin of the  liver mass. No small bowel or colonic dilatation within the visualized abdomen. Vascular/Lymphatic: No abdominal aortic aneurysm. 2.2 cm short axis hepatoduodenal ligament lymph node versus direct tumor extension into the porta hepatis. Other:  No intraperitoneal free fluid Musculoskeletal: No abnormal marrow signal within the visualized bony anatomy. IMPRESSION: 1. Study limited by motion artifact and inability to administer intravenous contrast material. Within this limitation, the patient has a large 7 x 8 x 9 cm mass centered in the inferior right liver, incorporating the mid gallbladder and extending anteriorly and cranially up into the medial segment of the left liver. Mass obstructs the right and left biliary ducts at the level of the confluence and the gallbladder fundus is distended and stone filled. 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 2. Relationship between the mass and retroperitoneal anatomy is limited by motion and lack of intravenous contrast. The mass is contiguous with the descending duodenum and duodenal involvement is a concern. No evidence for obstruction at the level of the duodenum. 3. Hepatoduodenal ligament lymphadenopathy versus direct tumor extension into the hepatoduodenal ligament. Electronically Signed   By: Misty Stanley M.D.   On: 09/10/2017 11:58        Scheduled Meds: . feeding supplement (ENSURE ENLIVE)  237 mL Oral BID BM  . levothyroxine  50 mcg Intravenous Q24H  . pantoprazole (PROTONIX) IV  40 mg Intravenous Q12H   Continuous Infusions: . piperacillin-tazobactam (ZOSYN)  IV       LOS: 2 days    Time spent: 25 mins    Audre Cenci Jodie Echevaria, MD [facility] Pager [phone]  If 7PM-7AM, please contact night-coverage [email or web address] Password [facility] 09/11/2017, 12:52 PM

## 2017-09-11 NOTE — Plan of Care (Signed)
Cont to mon 

## 2017-09-11 NOTE — Progress Notes (Signed)
Progress Note   Subjective  No abdominal pain. Appetite is fair. Family in room.    Objective  Vital signs in last 24 hours: Temp:  [98.5 F (36.9 C)-99.9 F (37.7 C)] 99.2 F (37.3 C) (01/27 1235) Pulse Rate:  [64-94] 70 (01/27 1235) Resp:  [16-18] 17 (01/27 1235) BP: (83-101)/(32-48) 83/32 (01/27 1235) SpO2:  [94 %-98 %] 98 % (01/27 1235) Last BM Date: 09/10/17  General: Alert, well-developed, in NAD Heart:  Regular rate and rhythm; no murmurs Chest: Clear to ascultation bilaterally Abdomen:  Soft, nontender and nondistended. Normal bowel sounds, without guarding, and without rebound.   Extremities:  Without edema. Neurologic:  Alert and  oriented x4; grossly normal neurologically. Psych:  Alert and cooperative. Normal mood and affect.  Intake/Output from previous day: 01/26 0701 - 01/27 0700 In: -  Out: 300 [Urine:300] Intake/Output this shift: Total I/O In: -  Out: 650 [Urine:650]  Lab Results: Recent Labs    09/09/17 1223 09/10/17 0457 09/11/17 0357  WBC 9.9 8.8 9.5  HGB 8.6* 8.4* 7.5*  HCT 24.6* 24.2* 22.1*  PLT 361 332 314   BMET Recent Labs    09/09/17 1223 09/10/17 0457 09/11/17 0357  NA 143 142 141  K 4.5 4.6 4.3  CL 115* 115* 116*  CO2 18* 19* 18*  GLUCOSE 105* 91 105*  BUN 44* 39* 35*  CREATININE 2.77* 2.63* 2.61*  CALCIUM 9.2 8.9 8.5*   LFT Recent Labs    09/11/17 0357  PROT 5.5*  ALBUMIN 2.1*  AST 88*  ALT 84*  ALKPHOS 211*  BILITOT 13.0*    Studies/Results: Mr Abdomen Mrcp Wo Contrast  Result Date: 09/10/2017 CLINICAL DATA:  Abnormal LFTs. EXAM: MRI ABDOMEN WITHOUT CONTRAST  (INCLUDING MRCP) TECHNIQUE: Multiplanar multisequence MR imaging of the abdomen was performed. Heavily T2-weighted images of the biliary and pancreatic ducts were obtained, and three-dimensional MRCP images were rendered by post processing. COMPARISON:  Ultrasound exam 09/09/2017 CT scan 04/12/2012. FINDINGS: Lower chest: Heart is enlarged.  Hepatobiliary: There is marked intra hepatic biliary duct dilatation involving both the right and left hepatic lobes level of obstruction is near the biliary confluence/common duct. Common bile duct in the head of the pancreas is nondilated. Obstructing etiology is a 6.8 x 8.2 x 8.5 cm irregular heterogeneous mass involving the inferior right liver and extending anteriorly and cranially up into the medial segment of the left liver. Mass lesion incorporates the body of the gallbladder. Pancreas: No focal mass lesion. No dilatation of the main duct. No intraparenchymal cyst. No peripancreatic edema. Spleen:  No splenomegaly. No focal mass lesion. Adrenals/Urinary Tract: No adrenal nodule or mass. 11 mm cyst identified upper pole left kidney. Right kidney unremarkable. Stomach/Bowel: Stomach is nondistended. No gastric wall thickening. No evidence of outlet obstruction. Duodenum is normally positioned as is the ligament of Treitz. Duodenum crosses in close proximity to is likely contiguous with the inferior and medial margin of the liver mass. No small bowel or colonic dilatation within the visualized abdomen. Vascular/Lymphatic: No abdominal aortic aneurysm. 2.2 cm short axis hepatoduodenal ligament lymph node versus direct tumor extension into the porta hepatis. Other:  No intraperitoneal free fluid Musculoskeletal: No abnormal marrow signal within the visualized bony anatomy. IMPRESSION: 1. Study limited by motion artifact and inability to administer intravenous contrast material. Within this limitation, the patient has a large 7 x 8 x 9 cm mass centered in the inferior right liver, incorporating the mid gallbladder and extending anteriorly and cranially up  into the medial segment of the left liver. Mass obstructs the right and left biliary ducts at the level of the confluence and the gallbladder fundus is distended and stone filled. Imaging features highly concerning for primary gallbladder neoplasm with  intrahepatic extension although cholangiocarcinoma or hepatocellular carcinoma with biliary and gallbladder involvement also possible. 2. Relationship between the mass and retroperitoneal anatomy is limited by motion and lack of intravenous contrast. The mass is contiguous with the descending duodenum and duodenal involvement is a concern. No evidence for obstruction at the level of the duodenum. 3. Hepatoduodenal ligament lymphadenopathy versus direct tumor extension into the hepatoduodenal ligament. Electronically Signed   By: Misty Stanley M.D.   On: 09/10/2017 11:58   Mr 3d Recon At Scanner  Result Date: 09/10/2017 CLINICAL DATA:  Abnormal LFTs. EXAM: MRI ABDOMEN WITHOUT CONTRAST  (INCLUDING MRCP) TECHNIQUE: Multiplanar multisequence MR imaging of the abdomen was performed. Heavily T2-weighted images of the biliary and pancreatic ducts were obtained, and three-dimensional MRCP images were rendered by post processing. COMPARISON:  Ultrasound exam 09/09/2017 CT scan 04/12/2012. FINDINGS: Lower chest: Heart is enlarged. Hepatobiliary: There is marked intra hepatic biliary duct dilatation involving both the right and left hepatic lobes level of obstruction is near the biliary confluence/common duct. Common bile duct in the head of the pancreas is nondilated. Obstructing etiology is a 6.8 x 8.2 x 8.5 cm irregular heterogeneous mass involving the inferior right liver and extending anteriorly and cranially up into the medial segment of the left liver. Mass lesion incorporates the body of the gallbladder. Pancreas: No focal mass lesion. No dilatation of the main duct. No intraparenchymal cyst. No peripancreatic edema. Spleen:  No splenomegaly. No focal mass lesion. Adrenals/Urinary Tract: No adrenal nodule or mass. 11 mm cyst identified upper pole left kidney. Right kidney unremarkable. Stomach/Bowel: Stomach is nondistended. No gastric wall thickening. No evidence of outlet obstruction. Duodenum is normally  positioned as is the ligament of Treitz. Duodenum crosses in close proximity to is likely contiguous with the inferior and medial margin of the liver mass. No small bowel or colonic dilatation within the visualized abdomen. Vascular/Lymphatic: No abdominal aortic aneurysm. 2.2 cm short axis hepatoduodenal ligament lymph node versus direct tumor extension into the porta hepatis. Other:  No intraperitoneal free fluid Musculoskeletal: No abnormal marrow signal within the visualized bony anatomy. IMPRESSION: 1. Study limited by motion artifact and inability to administer intravenous contrast material. Within this limitation, the patient has a large 7 x 8 x 9 cm mass centered in the inferior right liver, incorporating the mid gallbladder and extending anteriorly and cranially up into the medial segment of the left liver. Mass obstructs the right and left biliary ducts at the level of the confluence and the gallbladder fundus is distended and stone filled. 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 2. Relationship between the mass and retroperitoneal anatomy is limited by motion and lack of intravenous contrast. The mass is contiguous with the descending duodenum and duodenal involvement is a concern. No evidence for obstruction at the level of the duodenum. 3. Hepatoduodenal ligament lymphadenopathy versus direct tumor extension into the hepatoduodenal ligament. Electronically Signed   By: Misty Stanley M.D.   On: 09/10/2017 11:58      Assessment & Plan   1. Hepatic mass on MRI with intrahepatic biliary obstruction near bifurcation. Mass involves right lobe, GB, into left lobe. MRI without contrast and with  motion artifact so the study is somewhat limited.  Markedly dilated intrahepatic ducts and cholelithiasis. AFP is pending. If AFP is not markedly  elevated then proceed with IR biopsy of the mass. Reviewed MRI findings with patient and family. Hospitalist had already reviewed the findings. Consider approach to biliary drainage.   2. Microcytic anemia and heme + stool. Consider further work up after #1 evaluated.   3. AKI on CKD.   Dr. Ardis Hughs to assume GI care on Monday.    LOS: 2 days   Norberto Sorenson T. Fuller Plan MD 09/11/2017, 2:43 PM

## 2017-09-12 ENCOUNTER — Inpatient Hospital Stay (HOSPITAL_COMMUNITY): Payer: Medicare Other

## 2017-09-12 ENCOUNTER — Encounter (HOSPITAL_COMMUNITY): Payer: Self-pay | Admitting: Anesthesiology

## 2017-09-12 ENCOUNTER — Inpatient Hospital Stay (HOSPITAL_COMMUNITY): Payer: Medicare Other | Admitting: Certified Registered Nurse Anesthetist

## 2017-09-12 ENCOUNTER — Encounter (HOSPITAL_COMMUNITY): Admission: EM | Disposition: E | Payer: Self-pay | Source: Home / Self Care | Attending: Internal Medicine

## 2017-09-12 DIAGNOSIS — K828 Other specified diseases of gallbladder: Secondary | ICD-10-CM

## 2017-09-12 HISTORY — PX: ERCP: SHX5425

## 2017-09-12 LAB — CBC WITH DIFFERENTIAL/PLATELET
BASOS PCT: 0 %
Basophils Absolute: 0 10*3/uL (ref 0.0–0.1)
EOS ABS: 0.2 10*3/uL (ref 0.0–0.7)
Eosinophils Relative: 2 %
HCT: 23.7 % — ABNORMAL LOW (ref 36.0–46.0)
HEMOGLOBIN: 8 g/dL — AB (ref 12.0–15.0)
LYMPHS ABS: 1.8 10*3/uL (ref 0.7–4.0)
Lymphocytes Relative: 16 %
MCH: 27.6 pg (ref 26.0–34.0)
MCHC: 33.8 g/dL (ref 30.0–36.0)
MCV: 81.7 fL (ref 78.0–100.0)
Monocytes Absolute: 1.7 10*3/uL — ABNORMAL HIGH (ref 0.1–1.0)
Monocytes Relative: 16 %
NEUTROS ABS: 7.3 10*3/uL (ref 1.7–7.7)
NEUTROS PCT: 66 %
Platelets: 321 10*3/uL (ref 150–400)
RBC: 2.9 MIL/uL — AB (ref 3.87–5.11)
RDW: 17.5 % — ABNORMAL HIGH (ref 11.5–15.5)
WBC: 11.1 10*3/uL — AB (ref 4.0–10.5)

## 2017-09-12 LAB — GLUCOSE, CAPILLARY
GLUCOSE-CAPILLARY: 105 mg/dL — AB (ref 65–99)
GLUCOSE-CAPILLARY: 107 mg/dL — AB (ref 65–99)
Glucose-Capillary: 76 mg/dL (ref 65–99)

## 2017-09-12 LAB — COMPREHENSIVE METABOLIC PANEL
ALBUMIN: 2 g/dL — AB (ref 3.5–5.0)
ALK PHOS: 205 U/L — AB (ref 38–126)
ALT: 92 U/L — AB (ref 14–54)
ANION GAP: 8 (ref 5–15)
AST: 112 U/L — AB (ref 15–41)
BILIRUBIN TOTAL: 15.3 mg/dL — AB (ref 0.3–1.2)
BUN: 33 mg/dL — AB (ref 6–20)
CALCIUM: 9 mg/dL (ref 8.9–10.3)
CO2: 18 mmol/L — ABNORMAL LOW (ref 22–32)
CREATININE: 2.45 mg/dL — AB (ref 0.44–1.00)
Chloride: 117 mmol/L — ABNORMAL HIGH (ref 101–111)
GFR calc Af Amer: 21 mL/min — ABNORMAL LOW (ref >60)
GFR calc non Af Amer: 18 mL/min — ABNORMAL LOW (ref >60)
GLUCOSE: 86 mg/dL (ref 65–99)
Potassium: 4 mmol/L (ref 3.5–5.1)
Sodium: 143 mmol/L (ref 135–145)
TOTAL PROTEIN: 5.9 g/dL — AB (ref 6.5–8.1)

## 2017-09-12 LAB — PROTIME-INR
INR: 1.36
Prothrombin Time: 16.7 seconds — ABNORMAL HIGH (ref 11.4–15.2)

## 2017-09-12 LAB — URINE CULTURE: Culture: NO GROWTH

## 2017-09-12 LAB — AFP TUMOR MARKER: AFP, Serum, Tumor Marker: 9.2 ng/mL — ABNORMAL HIGH (ref 0.0–8.3)

## 2017-09-12 SURGERY — ERCP, WITH INTERVENTION IF INDICATED
Anesthesia: General

## 2017-09-12 MED ORDER — SUGAMMADEX SODIUM 200 MG/2ML IV SOLN
INTRAVENOUS | Status: DC | PRN
  Administered 2017-09-12: 200 mg via INTRAVENOUS

## 2017-09-12 MED ORDER — LIDOCAINE 2% (20 MG/ML) 5 ML SYRINGE
INTRAMUSCULAR | Status: DC | PRN
  Administered 2017-09-12: 100 mg via INTRAVENOUS

## 2017-09-12 MED ORDER — INDOMETHACIN 50 MG RE SUPP
RECTAL | Status: DC | PRN
  Administered 2017-09-12: 100 mg via RECTAL

## 2017-09-12 MED ORDER — PROPOFOL 10 MG/ML IV BOLUS
INTRAVENOUS | Status: DC | PRN
  Administered 2017-09-12: 150 mg via INTRAVENOUS

## 2017-09-12 MED ORDER — ROCURONIUM BROMIDE 50 MG/5ML IV SOSY
PREFILLED_SYRINGE | INTRAVENOUS | Status: DC | PRN
  Administered 2017-09-12: 50 mg via INTRAVENOUS

## 2017-09-12 MED ORDER — INDOMETHACIN 50 MG RE SUPP
RECTAL | Status: AC
  Filled 2017-09-12: qty 2

## 2017-09-12 MED ORDER — ONDANSETRON HCL 4 MG/2ML IJ SOLN
INTRAMUSCULAR | Status: DC | PRN
  Administered 2017-09-12: 4 mg via INTRAVENOUS

## 2017-09-12 MED ORDER — DEXAMETHASONE SODIUM PHOSPHATE 10 MG/ML IJ SOLN
INTRAMUSCULAR | Status: DC | PRN
  Administered 2017-09-12: 10 mg via INTRAVENOUS

## 2017-09-12 MED ORDER — SODIUM CHLORIDE 0.9 % IV SOLN
INTRAVENOUS | Status: DC | PRN
  Administered 2017-09-12: 3 mL

## 2017-09-12 MED ORDER — GLUCAGON HCL RDNA (DIAGNOSTIC) 1 MG IJ SOLR
INTRAMUSCULAR | Status: AC
  Filled 2017-09-12: qty 1

## 2017-09-12 MED ORDER — SODIUM CHLORIDE 0.9 % IV BOLUS (SEPSIS)
1000.0000 mL | Freq: Once | INTRAVENOUS | Status: AC
  Administered 2017-09-12: 1000 mL via INTRAVENOUS

## 2017-09-12 MED ORDER — SODIUM CHLORIDE 0.9 % IV SOLN
INTRAVENOUS | Status: DC
  Administered 2017-09-12: 10:00:00 via INTRAVENOUS

## 2017-09-12 NOTE — Anesthesia Preprocedure Evaluation (Addendum)
Anesthesia Evaluation  Patient identified by MRN, date of birth, ID band Patient awake    Reviewed: Allergy & Precautions, Patient's Chart, lab work & pertinent test results  Airway Mallampati: I  TM Distance: >3 FB Neck ROM: Full    Dental  (+) Edentulous Upper, Edentulous Lower   Pulmonary former smoker,    breath sounds clear to auscultation       Cardiovascular hypertension, Pt. on medications  Rhythm:Regular Rate:Normal     Neuro/Psych negative neurological ROS  negative psych ROS   GI/Hepatic GERD  Medicated,  Endo/Other  Hypothyroidism   Renal/GU      Musculoskeletal negative musculoskeletal ROS (+)   Abdominal (+) + obese,   Peds  Hematology negative hematology ROS (+)   Anesthesia Other Findings   Reproductive/Obstetrics                           Lab Results  Component Value Date   WBC 11.1 (H) 09/04/2017   HGB 8.0 (L) 08/31/2017   HCT 23.7 (L) 09/11/2017   MCV 81.7 08/23/2017   PLT 321 08/29/2017   Lab Results  Component Value Date   CREATININE 2.45 (H) 08/27/2017   BUN 33 (H) 09/02/2017   NA 143 09/11/2017   K 4.0 09/03/2017   CL 117 (H) 09/13/2017   CO2 18 (L) 08/26/2017   Lab Results  Component Value Date   INR 1.36 09/01/2017   INR 1.19 03/30/2012   EKG: normal sinus rhythm.  Anesthesia Physical Anesthesia Plan  ASA: II  Anesthesia Plan: General   Post-op Pain Management:    Induction: Intravenous  PONV Risk Score and Plan: 3 and Ondansetron, Dexamethasone and Treatment may vary due to age or medical condition  Airway Management Planned: Oral ETT  Additional Equipment: None  Intra-op Plan:   Post-operative Plan: Extubation in OR  Informed Consent: I have reviewed the patients History and Physical, chart, labs and discussed the procedure including the risks, benefits and alternatives for the proposed anesthesia with the patient or authorized  representative who has indicated his/her understanding and acceptance.     Plan Discussed with: CRNA  Anesthesia Plan Comments:         Anesthesia Quick Evaluation

## 2017-09-12 NOTE — Progress Notes (Signed)
Referring Physician(s): Rande Brunt  Supervising Physician: Aletta Edouard  Patient Status:  [facility] - In-pt  Chief Complaint:  Liver/GB Mass with biliary obstruction requiring percutaneous biliary drainage and mass biopsy  Subjective:  Patient lethargic, NAD, lying comfortably in bed, after ERCP today. Patient's son at bedside. Patient presenting with biliary obstruction from a 7 x 8 x 9 cm inferior right liver mass incorporating the gallbladder.  Mass is obstructing right and left biliary ducts with concern for duodenal involvement.  ERCP attempted today by GI but they were unable to cannulate biliary ducts.  Patient referred to IR by Dr. Ardis Hughs requesting percutaneous biliary drainage as well as biopsy of the mass.  Patient has been seen here  in 2013 for liver abscess which was successfully drained and grew citrobacter koseri.  Son reports that patient's main complaint has been primarily fatigue/weakness.  She has denied abdominal pain, nausea, vomiting or respiratory complaints.  Allergies: Advil [ibuprofen]; Aleve [naproxen sodium]; and Naproxen  Medications: Prior to Admission medications   Medication Sig Start Date End Date Taking? Authorizing Provider  acetaminophen (TYLENOL) 650 MG CR tablet Take 1,300 mg by mouth daily as needed for pain.   Yes [provider]  Calcium Carbonate-Vitamin D (CALTRATE 600+D) 600-400 MG-UNIT per chew tablet Chew 1 tablet by mouth daily.    Yes [provider]  cetirizine (ZYRTEC) 10 MG tablet Take 10 mg by mouth daily.   Yes [provider]  levothyroxine (SYNTHROID, LEVOTHROID) 100 MCG tablet Take 100 mcg by mouth daily before breakfast.   Yes [provider]  multivitamin-lutein (OCUVITE-LUTEIN) CAPS capsule Take 1 capsule by mouth daily.   Yes [provider]  omeprazole (PRILOSEC) 20 MG capsule Take 1 capsule (20 mg total) by mouth daily. 08/30/17  Yes Isla Pence, MD  potassium chloride (MICRO-K)  10 MEQ CR capsule Take 10 mEq by mouth 2 (two) times daily.   Yes [provider]  sodium chloride (OCEAN) 0.65 % SOLN nasal spray Place 2 sprays into both nostrils as needed for congestion.   Yes [provider]  valsartan-hydrochlorothiazide (DIOVAN-HCT) 160-25 MG per tablet Take 1 tablet by mouth daily.  01/03/14  Yes [provider]     Vital Signs: BP (!) 96/38 (BP Location: Left Arm)   Pulse 68   Temp 98.1 F (36.7 C) (Oral)   Resp 18   Ht 5\' 5"  (1.651 m)   Wt 221 lb 6.4 oz (100.4 kg)   SpO2 95%   BMI 36.84 kg/m   Physical Exam  Patient lying in bed resting comfortably, but lethargic follows commands.  Lungs clear to auscultation bilaterally.  Cardiac exam regular rate and rhythm no murmurs rubs or gallops noted.  Abdomen soft nontender bowel sounds normal.  No edema to the lower extremities noted.  Imaging: Mr Abdomen Mrcp Wo Contrast  Result Date: 09/10/2017 CLINICAL DATA:  Abnormal LFTs. EXAM: MRI ABDOMEN WITHOUT CONTRAST  (INCLUDING MRCP) TECHNIQUE: Multiplanar multisequence MR imaging of the abdomen was performed. Heavily T2-weighted images of the biliary and pancreatic ducts were obtained, and three-dimensional MRCP images were rendered by post processing. COMPARISON:  Ultrasound exam 09/09/2017 CT scan 04/12/2012. FINDINGS: Lower chest: Heart is enlarged. Hepatobiliary: There is marked intra hepatic biliary duct dilatation involving both the right and left hepatic lobes level of obstruction is near the biliary confluence/common duct. Common bile duct in the head of the pancreas is nondilated. Obstructing etiology is a 6.8 x 8.2 x 8.5 cm irregular heterogeneous mass involving  the inferior right liver and extending anteriorly and cranially up into the medial segment of the left liver. Mass lesion incorporates the body of the gallbladder. Pancreas: No focal mass lesion. No dilatation of the main duct. No intraparenchymal cyst. No peripancreatic edema.  Spleen:  No splenomegaly. No focal mass lesion. Adrenals/Urinary Tract: No adrenal nodule or mass. 11 mm cyst identified upper pole left kidney. Right kidney unremarkable. Stomach/Bowel: Stomach is nondistended. No gastric wall thickening. No evidence of outlet obstruction. Duodenum is normally positioned as is the ligament of Treitz. Duodenum crosses in close proximity to is likely contiguous with the inferior and medial margin of the liver mass. No small bowel or colonic dilatation within the visualized abdomen. Vascular/Lymphatic: No abdominal aortic aneurysm. 2.2 cm short axis hepatoduodenal ligament lymph node versus direct tumor extension into the porta hepatis. Other:  No intraperitoneal free fluid Musculoskeletal: No abnormal marrow signal within the visualized bony anatomy. IMPRESSION: 1. Study limited by motion artifact and inability to administer intravenous contrast material. Within this limitation, the patient has a large 7 x 8 x 9 cm mass centered in the inferior right liver, incorporating the mid gallbladder and extending anteriorly and cranially up into the medial segment of the left liver. Mass obstructs the right and left biliary ducts at the level of the confluence and the gallbladder fundus is distended and stone filled. 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 2. Relationship between the mass and retroperitoneal anatomy is limited by motion and lack of intravenous contrast. The mass is contiguous with the descending duodenum and duodenal involvement is a concern. No evidence for obstruction at the level of the duodenum. 3. Hepatoduodenal ligament lymphadenopathy versus direct tumor extension into the hepatoduodenal ligament. Electronically Signed   By: Misty Stanley M.D.   On: 09/10/2017 11:58   Mr 3d Recon At Scanner  Result  Date: 09/10/2017 CLINICAL DATA:  Abnormal LFTs. EXAM: MRI ABDOMEN WITHOUT CONTRAST  (INCLUDING MRCP) TECHNIQUE: Multiplanar multisequence MR imaging of the abdomen was performed. Heavily T2-weighted images of the biliary and pancreatic ducts were obtained, and three-dimensional MRCP images were rendered by post processing. COMPARISON:  Ultrasound exam 09/09/2017 CT scan 04/12/2012. FINDINGS: Lower chest: Heart is enlarged. Hepatobiliary: There is marked intra hepatic biliary duct dilatation involving both the right and left hepatic lobes level of obstruction is near the biliary confluence/common duct. Common bile duct in the head of the pancreas is nondilated. Obstructing etiology is a 6.8 x 8.2 x 8.5 cm irregular heterogeneous mass involving the inferior right liver and extending anteriorly and cranially up into the medial segment of the left liver. Mass lesion incorporates the body of the gallbladder. Pancreas: No focal mass lesion. No dilatation of the main duct. No intraparenchymal cyst. No peripancreatic edema. Spleen:  No splenomegaly. No focal mass lesion. Adrenals/Urinary Tract: No adrenal nodule or mass. 11 mm cyst identified upper pole left kidney. Right kidney unremarkable. Stomach/Bowel: Stomach is nondistended. No gastric wall thickening. No evidence of outlet obstruction. Duodenum is normally positioned as is the ligament of Treitz. Duodenum crosses in close proximity to is likely contiguous with the inferior and medial margin of the liver mass. No small bowel or colonic dilatation within the visualized abdomen. Vascular/Lymphatic: No abdominal aortic aneurysm. 2.2 cm short axis hepatoduodenal ligament lymph node versus direct tumor extension into the porta hepatis. Other:  No intraperitoneal free fluid Musculoskeletal: No abnormal marrow signal within the  visualized bony anatomy. IMPRESSION: 1. Study limited by motion artifact and inability to administer intravenous contrast material. Within this  limitation, the patient has a large 7 x 8 x 9 cm mass centered in the inferior right liver, incorporating the mid gallbladder and extending anteriorly and cranially up into the medial segment of the left liver. Mass obstructs the right and left biliary ducts at the level of the confluence and the gallbladder fundus is distended and stone filled. 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 2. Relationship between the mass and retroperitoneal anatomy is limited by motion and lack of intravenous contrast. The mass is contiguous with the descending duodenum and duodenal involvement is a concern. No evidence for obstruction at the level of the duodenum. 3. Hepatoduodenal ligament lymphadenopathy versus direct tumor extension into the hepatoduodenal ligament. Electronically Signed   By: Misty Stanley M.D.   On: 09/10/2017 11:58   Dg Chest Port 1 View  Result Date: 09/09/2017 CLINICAL DATA:  Fatigue and weakness EXAM: PORTABLE CHEST 1 VIEW COMPARISON:  March 02, 2014 FINDINGS: There is no edema or consolidation. The heart is upper normal in size with pulmonary vascularity within normal limits. There is aortic atherosclerosis. There are surgical clips over the right upper hemithorax, stable. No evident bone lesions. IMPRESSION: Aortic atherosclerosis. No edema or consolidation. Stable cardiac silhouette. Aortic Atherosclerosis (ICD10-I70.0). Electronically Signed   By: Lowella Grip III M.D.   On: 09/09/2017 07:13   Dg C-arm 1-60 Min-no Report  Result Date: 08/16/2017 Fluoroscopy was utilized by the requesting physician.  No radiographic interpretation.   US Abdomen Limited Ruq  Result Date: 09/09/2017 CLINICAL DATA:  Increased bilirubin.  History of gallstones. EXAM: ULTRASOUND ABDOMEN LIMITED RIGHT UPPER QUADRANT COMPARISON:  Ultrasound kidneys  10/15/2016 FINDINGS: Gallbladder: Cholelithiasis with multiple stones and sludge filling the gallbladder. Largest stone is measured at 1.6 cm. Gallbladder wall thickness is mildly increased at 3.4 mm. Murphy's sign is negative. Common bile duct: Diameter: Dilated at 8.6 mm. No cause of obstruction identified but the entire duct is not visualized. Liver: No focal lesion identified. Within normal limits in parenchymal echogenicity. Intrahepatic bile duct dilatation is present. Portal vein is patent on color Doppler imaging with normal direction of blood flow towards the liver. IMPRESSION: 1. Cholelithiasis with sludge and gallbladder wall thickening. Changes may represent cholecystitis although Murphy's sign is negative. 2. Dilated intra and extrahepatic bile ducts suspicious for obstruction. No stone is identified but the entire common bile duct is not visualized. Electronically Signed   By: Lucienne Capers M.D.   On: 09/09/2017 03:02    Labs:  CBC: Recent Labs    09/09/17 1223 09/10/17 0457 09/11/17 0357 08/25/2017 0455  WBC 9.9 8.8 9.5 11.1*  HGB 8.6* 8.4* 7.5* 8.0*  HCT 24.6* 24.2* 22.1* 23.7*  PLT 361 332 314 321    COAGS: Recent Labs    08/26/2017 0455  INR 1.36    BMP: Recent Labs    09/09/17 1223 09/10/17 0457 09/11/17 0357 08/20/2017 0455  NA 143 142 141 143  K 4.5 4.6 4.3 4.0  CL 115* 115* 116* 117*  CO2 18* 19* 18* 18*  GLUCOSE 105* 91 105* 86  BUN 44* 39* 35* 33*  CALCIUM 9.2 8.9 8.5* 9.0  CREATININE 2.77* 2.63* 2.61* 2.45*  GFRNONAA 16* 17* 17* 18*  GFRAA 18* 19* 19* 21*    LIVER FUNCTION TESTS: Recent Labs  09/09/17 1223 09/10/17 0457 09/11/17 0357 09/01/2017 0455  BILITOT 14.4* 13.6* 13.0* 15.3*  AST 92* 77* 88* 112*  ALT 108* 92* 84* 92*  ALKPHOS 282* 236* 211* 205*  PROT 6.7 6.0* 5.5* 5.9*  ALBUMIN 2.4* 2.2* 2.1* 2.0*    Assessment and Plan:  Patient with history of prior citrobacter Koseri Liver abscess drainage 2013, now  presenting with  biliary obstruction from a 7 x 8 x 9 cm inferior right liver mass incorporating the gallbladder.  Mass is obstructing right and left biliary ducts with concern for duodenal involvement. ERCP unsuccessful today with common bile duct cannulation due to stricture. Latest imaging studies were reviewed by Dr. Kathlene Cote.  Plan for Dr. Kathlene Cote to perform Percutaneous Biliary drainage and tumor biopsy tomorrow.  Details/risks of procedure, including but not limited to, internal bleeding, infection, injury to adjacent structures discussed with patient's son with his understanding and consent.  Consent signed by patient's son at bedside.  Current labs include WBC 11.1, hemoglobin 8.0, platelets 321K, pro time 16.7, INR 1.36.  Electronically Signed: D. Rowe Robert, PA-C/ Antonieta Loveless 08/16/2017, 2:56 PM   I spent a total of 30 minutes at the the patient's bedside AND on the patient's hospital floor or unit, greater than 50% of which was counseling/coordinating care for percutaneous transhepatic cholangiogram with biliary drain/possible bile duct brush biopsy or liver lesion biopsy    Patient ID: Chelsey Savage, female   DOB: 12/11/1940, 77 y.o.   MRN: [id number]

## 2017-09-12 NOTE — Interval H&P Note (Signed)
History and Physical Interval Note:  08/30/2017 10:33 AM  Chelsey Savage  has presented today for surgery, with the diagnosis of obstructive jaundice  The various methods of treatment have been discussed with the patient and family. After consideration of risks, benefits and other options for treatment, the patient has consented to  Procedure(s): ENDOSCOPIC RETROGRADE CHOLANGIOPANCREATOGRAPHY (ERCP) (N/A) as a surgical intervention .  The patient's history has been reviewed, patient examined, no change in status, stable for surgery.  I have reviewed the patient's chart and labs.  Questions were answered to the patient's satisfaction.     Milus Banister

## 2017-09-12 NOTE — H&P (View-Only) (Signed)
[facility] Progress Note    Since last GI note: Slept well.  NPO since midnight  IMPRESSION: 1. Study limited by motion artifact and inability to administer intravenous contrast material. Within this limitation, the patient has a large 7 x 8 x 9 cm mass centered in the inferior right liver, incorporating the mid gallbladder and extending anteriorly and cranially up into the medial segment of the left liver. Mass obstructs the right and left biliary ducts at the level of the confluence and the gallbladder fundus is distended and stone filled. 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 2. Relationship between the mass and retroperitoneal anatomy is limited by motion and lack of intravenous contrast. The mass is contiguous with the descending duodenum and duodenal involvement is a concern. No evidence for obstruction at the level of the duodenum. 3. Hepatoduodenal ligament lymphadenopathy versus direct tumor extension into the hepatoduodenal ligament.  Objective: Vital signs in last 24 hours: Temp:  [98.9 F (37.2 C)-99.2 F (37.3 C)] 98.9 F (37.2 C) (01/28 0439) Pulse Rate:  [65-70] 69 (01/28 0439) Resp:  [16-17] 16 (01/28 0439) BP: (83-106)/(32-55) 106/55 (01/28 0439) SpO2:  [97 %-98 %] 97 % (01/28 0439) Weight:  [221 lb 6.4 oz (100.4 kg)] 221 lb 6.4 oz (100.4 kg) (01/28 0439) Last BM Date: 09/10/17 General: alert and oriented times 3 Heart: regular rate and rythm Abdomen: soft, non-tender, non-distended, normal bowel sounds   Lab Results: Recent Labs    09/09/17 1223 09/10/17 0457 09/11/17 0357  WBC 9.9 8.8 9.5  HGB 8.6* 8.4* 7.5*  PLT 361 332 314  MCV 79.1 79.9 82.2   Recent Labs    09/10/17 0457 09/11/17 0357 09/13/2017 0455  NA 142 141 143  K 4.6 4.3 4.0  CL 115* 116* 117*  CO2 19* 18* 18*   GLUCOSE 91 105* 86  BUN 39* 35* 33*  CREATININE 2.63* 2.61* 2.45*  CALCIUM 8.9 8.5* 9.0   Recent Labs    09/10/17 0457 09/11/17 0357 08/19/2017 0455  PROT 6.0* 5.5* 5.9*  ALBUMIN 2.2* 2.1* 2.0*  AST 77* 88* 112*  ALT 92* 84* 92*  ALKPHOS 236* 211* 205*  BILITOT 13.6* 13.0* 15.3*   Recent Labs    09/04/2017 0455  INR 1.36     Medications: Scheduled Meds: . feeding supplement (ENSURE ENLIVE)  237 mL Oral BID BM  . levothyroxine  100 mcg Oral QAC breakfast  . pantoprazole (PROTONIX) IV  40 mg Intravenous Q12H   Continuous Infusions: . sodium chloride 125 mL/hr at 08/20/2017 0515  . piperacillin-tazobactam (ZOSYN)  IV 3.375 g (08/17/2017 0515)   PRN Meds:.alum & mag hydroxide-simeth, hydrALAZINE, ondansetron **OR** ondansetron (ZOFRAN) IV, sodium chloride   Assessment/Plan: 77 y.o. female with obstructive jaundice from a large mass involving GB, right liver  Unclear etiology ([place], GB cancer, cholangiocarcinoma seem most likely possibilities).  She needs biliary decompression and I will attempt that today with ERCP.  The proximal end of the stricture appears to be located in the common hepatic duct.  If possible I brush the stricture and will place a uncovered SEMS.  If ERCP not possible, then she will need PTC biliary drain and perc biopsy.  She understands the risks, benefits (including pancreatitis, bleeding, perforation) and agrees to proceed.    Milus Banister, MD  08/16/2017, 7:48 AM [facility] Pager [phone]

## 2017-09-12 NOTE — Op Note (Signed)
[facility] Patient Name: Chelsey Savage Procedure Date: 08/30/2017 MRN: [id number] Attending MD: Milus Banister , MD Date of Birth: 06-26-1941 CSN: [id number] Age: 77 Admit Type: Inpatient Procedure:                ERCP Indications:              Obstructive jaundice, 9cm mass in liver involving                            GB, causing stricture from proximal CHD to distal                            CBD with diffuse, bilateral intrahepatic duct                            dilation. Providers:                Milus Banister, MD, Cleda Daub, RN, Cherylynn Ridges, Technician, Dione Booze, CRNA Referring MD:              Medicines:                General Anesthesia, Zosyn IV, indomethacin PR Complications:            No immediate complications. Estimated blood loss:                            None Estimated Blood Loss:     Estimated blood loss: none. Procedure:                Pre-Anesthesia Assessment:                           - Prior to the procedure, a History and Physical                            was performed, and patient medications and                            allergies were reviewed. The patient's tolerance of                            previous anesthesia was also reviewed. The risks                            and benefits of the procedure and the sedation                            options and risks were discussed with the patient.                            All questions were answered, and informed consent  was obtained. Prior Anticoagulants: The patient has                            taken no previous anticoagulant or antiplatelet                            agents. ASA Grade Assessment: III - A patient with                            severe systemic disease. After reviewing the risks                            and benefits, the patient was deemed in                            satisfactory condition  to undergo the procedure.                           After obtaining informed consent, the scope was                            passed under direct vision. Throughout the                            procedure, the patient's blood pressure, pulse, and                            oxygen saturations were monitored continuously. The                            Duodenoscope was introduced through the mouth, and                            used to inject contrast into and used to cannulate                            the bile duct. The ERCP was performed with moderate                            difficulty due to challenging cannulation because                            of abnormal anatomy. The patient tolerated the                            procedure well. Scope In: Scope Out: Findings:      The scout film was normal. The duodenoscope was advanced to the region       of the major papilla without detailed examination of the UGI tract. A 44       Autotome over a .035 hydrawire was used to attempt biliary cannulation       without success. An identical wire was temporarily placed into the main       pancreatic duct. Contrast was briefly injected  into the main pancreatic       duct. I was able to cannulate the most distal CBD but could not advance       the wire more proximally. Contrast never opacified the biliary tree. I       suspect a very tight biliary stricture. Impression:               Tight biliary stricture with distal edge in the                            distal CBD. I could not advance a wire or dye into                            (or past) this stricture. Moderate Sedation:      N/A- Per Anesthesia Care Recommendation:           - Return patient to hospital ward for ongoing care.                           - Continue present medications.                           - I will contact IR about PTC for biliary                            decompression as well as percutaneous biopsy of the                             liver mass. Procedure Code(s):        --- Professional ---                           [phone], Endoscopic retrograde                            cholangiopancreatography (ERCP); diagnostic,                            including collection of specimen(s) by brushing or                            washing, when performed (separate procedure) Diagnosis Code(s):        --- Professional ---                           R17, Unspecified jaundice CPT copyright 2016 [employer]. All rights reserved. The codes documented in this report are preliminary and upon coder review may  be revised to meet current compliance requirements. Milus Banister, MD 09/15/2017 12:18:49 PM This report has been signed electronically. Number of Addenda: 0

## 2017-09-12 NOTE — Anesthesia Procedure Notes (Signed)
Procedure Name: Intubation Date/Time: 09/11/2017 10:52 AM Performed by: Dione Booze, CRNA Pre-anesthesia Checklist: Suction available, Patient being monitored, Emergency Drugs available and Patient identified Patient Re-evaluated:Patient Re-evaluated prior to induction Oxygen Delivery Method: Circle system utilized Preoxygenation: Pre-oxygenation with 100% oxygen Induction Type: IV induction Ventilation: Mask ventilation without difficulty Laryngoscope Size: Mac and 4 Grade View: Grade I Tube type: Oral Tube size: 7.5 mm Number of attempts: 1 Airway Equipment and Method: Stylet Placement Confirmation: ETT inserted through vocal cords under direct vision,  positive ETCO2 and breath sounds checked- equal and bilateral Secured at: 22 cm Tube secured with: Tape Dental Injury: Teeth and Oropharynx as per pre-operative assessment

## 2017-09-12 NOTE — Progress Notes (Signed)
[facility] Progress Note    Since last GI note: Slept well.  NPO since midnight  IMPRESSION: 1. Study limited by motion artifact and inability to administer intravenous contrast material. Within this limitation, the patient has a large 7 x 8 x 9 cm mass centered in the inferior right liver, incorporating the mid gallbladder and extending anteriorly and cranially up into the medial segment of the left liver. Mass obstructs the right and left biliary ducts at the level of the confluence and the gallbladder fundus is distended and stone filled. 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 2. Relationship between the mass and retroperitoneal anatomy is limited by motion and lack of intravenous contrast. The mass is contiguous with the descending duodenum and duodenal involvement is a concern. No evidence for obstruction at the level of the duodenum. 3. Hepatoduodenal ligament lymphadenopathy versus direct tumor extension into the hepatoduodenal ligament.  Objective: Vital signs in last 24 hours: Temp:  [98.9 F (37.2 C)-99.2 F (37.3 C)] 98.9 F (37.2 C) (01/28 0439) Pulse Rate:  [65-70] 69 (01/28 0439) Resp:  [16-17] 16 (01/28 0439) BP: (83-106)/(32-55) 106/55 (01/28 0439) SpO2:  [97 %-98 %] 97 % (01/28 0439) Weight:  [221 lb 6.4 oz (100.4 kg)] 221 lb 6.4 oz (100.4 kg) (01/28 0439) Last BM Date: 09/10/17 General: alert and oriented times 3 Heart: regular rate and rythm Abdomen: soft, non-tender, non-distended, normal bowel sounds   Lab Results: Recent Labs    09/09/17 1223 09/10/17 0457 09/11/17 0357  WBC 9.9 8.8 9.5  HGB 8.6* 8.4* 7.5*  PLT 361 332 314  MCV 79.1 79.9 82.2   Recent Labs    09/10/17 0457 09/11/17 0357 09/11/2017 0455  NA 142 141 143  K 4.6 4.3 4.0  CL 115* 116* 117*  CO2 19* 18* 18*   GLUCOSE 91 105* 86  BUN 39* 35* 33*  CREATININE 2.63* 2.61* 2.45*  CALCIUM 8.9 8.5* 9.0   Recent Labs    09/10/17 0457 09/11/17 0357 08/19/2017 0455  PROT 6.0* 5.5* 5.9*  ALBUMIN 2.2* 2.1* 2.0*  AST 77* 88* 112*  ALT 92* 84* 92*  ALKPHOS 236* 211* 205*  BILITOT 13.6* 13.0* 15.3*   Recent Labs    09/07/2017 0455  INR 1.36     Medications: Scheduled Meds: . feeding supplement (ENSURE ENLIVE)  237 mL Oral BID BM  . levothyroxine  100 mcg Oral QAC breakfast  . pantoprazole (PROTONIX) IV  40 mg Intravenous Q12H   Continuous Infusions: . sodium chloride 125 mL/hr at 09/01/2017 0515  . piperacillin-tazobactam (ZOSYN)  IV 3.375 g (09/02/2017 0515)   PRN Meds:.alum & mag hydroxide-simeth, hydrALAZINE, ondansetron **OR** ondansetron (ZOFRAN) IV, sodium chloride   Assessment/Plan: 77 y.o. female with obstructive jaundice from a large mass involving GB, right liver  Unclear etiology ([place], GB cancer, cholangiocarcinoma seem most likely possibilities).  She needs biliary decompression and I will attempt that today with ERCP.  The proximal end of the stricture appears to be located in the common hepatic duct.  If possible I brush the stricture and will place a uncovered SEMS.  If ERCP not possible, then she will need PTC biliary drain and perc biopsy.  She understands the risks, benefits (including pancreatitis, bleeding, perforation) and agrees to proceed.    Milus Banister, MD  08/23/2017, 7:48 AM [facility] Pager [phone]

## 2017-09-12 NOTE — Transfer of Care (Signed)
Immediate Anesthesia Transfer of Care Note  Patient: Chelsey Savage  Procedure(s) Performed: ENDOSCOPIC RETROGRADE CHOLANGIOPANCREATOGRAPHY (ERCP) (N/A )  Patient Location: PACU and Endoscopy Unit  Anesthesia Type:General  Level of Consciousness: awake and patient cooperative  Airway & Oxygen Therapy: Patient Spontanous Breathing and Patient connected to face mask oxygen  Post-op Assessment: Report given to RN and Post -op Vital signs reviewed and stable  Post vital signs: Reviewed and stable  Last Vitals:  Vitals:   09/08/2017 0439 08/27/2017 1024  BP: (!) 106/55 (!) 125/47  Pulse: 69 69  Resp: 16 18  Temp: 37.2 C 36.9 C  SpO2: 97% 96%    Last Pain:  Vitals:   08/27/2017 1024  TempSrc: Oral  PainSc:       Patients Stated Pain Goal: 3 (40/10/27 2536)  Complications: No apparent anesthesia complications

## 2017-09-12 NOTE — Anesthesia Postprocedure Evaluation (Signed)
Anesthesia Post Note  Patient: Chelsey Savage  Procedure(s) Performed: ENDOSCOPIC RETROGRADE CHOLANGIOPANCREATOGRAPHY (ERCP) (N/A )     Patient location during evaluation: PACU Anesthesia Type: General Level of consciousness: awake and alert Pain management: pain level controlled Vital Signs Assessment: post-procedure vital signs reviewed and stable Respiratory status: spontaneous breathing, nonlabored ventilation, respiratory function stable and patient connected to nasal cannula oxygen Cardiovascular status: blood pressure returned to baseline and stable Postop Assessment: no apparent nausea or vomiting Anesthetic complications: no    Last Vitals:  Vitals:   08/16/2017 1240 08/26/2017 1250  BP: (!) 113/29 (!) 100/29  Pulse: 63 65  Resp: 13 14  Temp:    SpO2: 97% 96%    Last Pain:  Vitals:   08/24/2017 1024  TempSrc: Oral  PainSc:                  Effie Berkshire

## 2017-09-12 NOTE — Progress Notes (Signed)
PROGRESS NOTE    Chelsey Savage  CVE:[id number] DOB: [birth year] DOA: 09/11/2017 PCP: Minette Brine   Brief Narrative: Patient is a 77 year old female with past medical history of hypertension, hypothyroidism, chronic kidney disease, liver abscess, history of breast cancer status post right mastectomy who presented to the emergency department with complaints of fatigue and weakness.  Patient also reported of having black tarry stools.  She was found to be icteric.  Her liver enzymes were elevated on presentation with cholestatic pattern.  Ultrasound of the gallbladder showed intrahepatic and extrahepatic biliary dilatation with possible distal obstruction.  Gallstones were present.  Patient denies any abdominal pain.  Patient was admitted for further workup for jaundice and possible GI bleed.  GI consulted. Patient also found to have profound acute kidney injury on CKD. MRCP done on 09/11/17  shows  gallbladder mass. Underwent ERCP today  Assessment & Plan:   Principal Problem:   Jaundice Active Problems:   Hypertension   Hypothyroid   Acute kidney injury superimposed on chronic kidney disease (HCC)   Liver mass   ARF (acute renal failure) (HCC)   Gallbladder mass   Abnormal LFTs  Jaundice/gallstones/cholestatic pattern/gallbladder mass:  ERCP done today. Plan for consulting  IR about PTC for biliary decompression as well as percutaneous biopsy of the gallbladder/liver mass.  MRCP report:Large 7 x 8 x 9 cm mass centered in the inferior right liver, incorporating the mid gallbladder and extending anteriorly and cranially up into the medial segment of the left liver. Mass obstructs the right and left biliary ducts at the level of the confluence and the gallbladder fundus is distended and stone filled. Imaging features highly concerning for primary gallbladder neoplasm with intrahepatic extension although cholangiocarcinoma or hepatocellular carcinoma with biliary and gallbladder  involvement also possible. Hepatoduodenal ligament lymphadenopathy versus direct tumor extension into the hepatoduodenal ligament.  Right upper quadrant sonogram :Possible distal obstruction with external and internal biliary duct dilation.  Patient denies any abdomen pain.  Elevated liver enzymes with cholestatic pattern.   Currently  on Zosyn. She also has history of recurrent dysphagia now resolved with PPI.Marland Kitchen  Acute kidney injury on [facility]:ENIDPOEUM. Last creatinine in 2017 was 1.6 which could be her baseline.  She follows with nephrologist as an outpatient.  Found to be severely dehydrated with acute kidney injury on presentation.  Patient was having severe loss of appetite and decreased oral intake at home.  Started on IV fluids.    ARB and hydrochlorothiazide on hold.  Anemia with possible GI bleed: Started on Protonix.  Reported of having black tarry stools at home.  FOBT done in the emergency department was positive.  We will continue to monitor H&H.We will transfuse as necessary  Hypertension: BP now borderline low. We will continue to monitor.Continue IV fluids  Hypothyroidism: Continue Synthyroid.    Left ear ache: Patient suspected of having an insect inside her left ear on admission.  ENT consulted and  found to have just a small wax material in the left ear.  DVT prophylaxis: SCD Code Status: Full Family Communication: None present at the bedside Disposition Plan: Home, unknown time  Consultants: Gastroenterology  Procedures: None  Antimicrobials: Zosyn since 09/09/17  Subjective: Patient seen and examined the bedside this morning.  She was on the way to ERCP.  No new events/issues since yesterday.  Objective: Vitals:   08/23/2017 1024 08/28/2017 1220 08/20/2017 1230 08/25/2017 1240  BP: (!) 125/47 (!) 131/35 (!) 121/34 (!) 113/29  Pulse: 69 70 68 63  Resp: _0 Temp: 98.5 F (36.9 C)     TempSrc: Oral     SpO2: 96% 100% 100% 97%  Weight:      Height:         Intake/Output Summary (Last 24 hours) at 08/26/2017 1252 Last data filed at 08/24/2017 1209 Gross per 24 hour  Intake 1325 ml  Output 1335 ml  Net -10 ml   Filed Weights   09/09/17 1629 08/27/2017 0439  Weight: 96.1 kg (211 lb 12.8 oz) 100.4 kg (221 lb 6.4 oz)    Examination:  General exam: Appears calm and comfortable ,Not in distress,average built Respiratory system: Bilateral equal air entry, normal vesicular breath sounds, no wheezes or crackles  Cardiovascular system: S1 & S2 heard, RRR. No JVD, murmurs, rubs, gallops or clicks.  Gastrointestinal system: Abdomen is slightly distended, soft and nontender. No organomegaly or masses felt. Normal bowel sounds heard. Central nervous system: Alert and oriented. No focal neurological deficits. Extremities: No edema, no clubbing ,no cyanosis, distal peripheral pulses palpable. Skin: No cyanosis,No pallor,No Rash,No Ulcer Psychiatry: Looks depressed GU: No Foley     Data Reviewed: I have personally reviewed following labs and imaging studies  CBC: Recent Labs  Lab 09/09/17 0102 09/09/17 1223 09/10/17 0457 09/11/17 0357 08/16/2017 0455  WBC 12.5* 9.9 8.8 9.5 11.1*  NEUTROABS 9.0* 7.4 6.3 6.9 7.3  HGB 9.1* 8.6* 8.4* 7.5* 8.0*  HCT 25.7* 24.6* 24.2* 22.1* 23.7*  MCV 79.1 79.1 79.9 82.2 81.7  PLT 390 361 332 314 409   Basic Metabolic Panel: Recent Labs  Lab 09/09/17 0102 09/09/17 1223 09/10/17 0457 09/11/17 0357 09/03/2017 0455  NA 140 143 142 141 143  K 4.4 4.5 4.6 4.3 4.0  CL 109 115* 115* 116* 117*  CO2 19* 18* 19* 18* 18*  GLUCOSE 109* 105* 91 105* 86  BUN 48* 44* 39* 35* 33*  CREATININE 3.00* 2.77* 2.63* 2.61* 2.45*  CALCIUM 9.7 9.2 8.9 8.5* 9.0   GFR: Estimated Creatinine Clearance: 22.9 mL/min (A) (by C-G formula based on SCr of 2.45 mg/dL (H)). Liver Function Tests: Recent Labs  Lab 09/09/17 0102 09/09/17 1223 09/10/17 0457 09/11/17 0357 09/14/2017 0455  AST 116* 92* 77* 88* 112*  ALT 129* 108* 92*  84* 92*  ALKPHOS 299* 282* 236* 211* 205*  BILITOT 15.3* 14.4* 13.6* 13.0* 15.3*  PROT 7.3 6.7 6.0* 5.5* 5.9*  ALBUMIN 2.6* 2.4* 2.2* 2.1* 2.0*   Recent Labs  Lab 09/09/17 0102  LIPASE 23   No results for input(s): AMMONIA in the last 168 hours. Coagulation Profile: Recent Labs  Lab 08/28/2017 0455  INR 1.36   Cardiac Enzymes: No results for input(s): CKTOTAL, CKMB, CKMBINDEX, TROPONINI in the last 168 hours. BNP (last 3 results) No results for input(s): PROBNP in the last 8760 hours. HbA1C: No results for input(s): HGBA1C in the last 72 hours. CBG: Recent Labs  Lab 09/10/17 1752 09/11/17 1227 09/11/17 1644 09/11/17 2356 08/16/2017 0620  GLUCAP 107* 92 85 82 76   Lipid Profile: No results for input(s): CHOL, HDL, LDLCALC, TRIG, CHOLHDL, LDLDIRECT in the last 72 hours. Thyroid Function Tests: No results for input(s): TSH, T4TOTAL, FREET4, T3FREE, THYROIDAB in the last 72 hours. Anemia Panel: No results for input(s): VITAMINB12, FOLATE, FERRITIN, TIBC, IRON, RETICCTPCT in the last 72 hours. Sepsis Labs: No results for input(s): PROCALCITON, LATICACIDVEN in the last 168 hours.  Recent Results (from the past 240 hour(s))  Culture, Urine     Status: None  Collection Time: 09/11/17  8:39 AM  Result Value Ref Range Status   Specimen Description URINE, CLEAN CATCH  Final   Special Requests NONE  Final   Culture   Final    NO GROWTH Performed at [facility], [address], [place], [place] [address]    Report Status 08/30/2017 FINAL  Final         Radiology Studies: Dg C-arm 1-60 Min-no Report  Result Date: 08/29/2017 Fluoroscopy was utilized by the requesting physician.  No radiographic interpretation.        Scheduled Meds: . [MAR Hold] feeding supplement (ENSURE ENLIVE)  237 mL Oral BID BM  . [MAR Hold] levothyroxine  100 mcg Oral QAC breakfast  . [MAR Hold] pantoprazole (PROTONIX) IV  40 mg Intravenous Q12H   Continuous Infusions: . sodium  chloride 125 mL/hr at 09/01/2017 0749  . sodium chloride    . [MAR Hold] piperacillin-tazobactam (ZOSYN)  IV Stopped (08/18/2017 0915)     LOS: 3 days    Time spent: 25 mins    Kaio Kuhlman Jodie Echevaria, MD [facility] Pager [phone]  If 7PM-7AM, please contact night-coverage [email or web address] Password [facility] 08/17/2017, 12:52 PM

## 2017-09-12 NOTE — Progress Notes (Signed)
Upon return from ERCP pt's BP was 82/28 manually checked by RN, HR 68. MD made aware and orders received to give 1L NS bolus. Will give bolus and continue to monitor BP.

## 2017-09-12 NOTE — Progress Notes (Signed)
BP 100/38 manually checked by RN, HR 68- after 1L NS bolus completed. Will continue to monitor BP.

## 2017-09-12 NOTE — Care Management Important Message (Signed)
Important Message  Patient Details  Name: Chelsey Savage MRN: [id number] Date of Birth: 09-06-1940   Medicare Important Message Given:  Yes    Kerin Salen 09/15/2017, 12:16 [place] Message  Patient Details  Name: Chelsey Savage MRN: [id number] Date of Birth: March 21, 1941   Medicare Important Message Given:  Yes    Kerin Salen 08/27/2017, 12:16 PM

## 2017-09-13 ENCOUNTER — Inpatient Hospital Stay (HOSPITAL_COMMUNITY): Payer: Medicare Other

## 2017-09-13 DIAGNOSIS — R579 Shock, unspecified: Secondary | ICD-10-CM

## 2017-09-13 DIAGNOSIS — E872 Acidosis: Secondary | ICD-10-CM

## 2017-09-13 DIAGNOSIS — N189 Chronic kidney disease, unspecified: Secondary | ICD-10-CM

## 2017-09-13 HISTORY — PX: IR PERCUTANEOUS TRANSHEPATIC CHOLANGIOGRAM: IMG6042

## 2017-09-13 HISTORY — PX: IR US GUIDE BX ASP/DRAIN: IMG2392

## 2017-09-13 HISTORY — PX: IR US GUIDE VASC ACCESS RIGHT: IMG2390

## 2017-09-13 HISTORY — PX: IR ANGIOGRAM SELECTIVE EACH ADDITIONAL VESSEL: IMG667

## 2017-09-13 HISTORY — PX: IR ANGIOGRAM VISCERAL SELECTIVE: IMG657

## 2017-09-13 HISTORY — PX: IR EMBO ART  VEN HEMORR LYMPH EXTRAV  INC GUIDE ROADMAPPING: IMG5450

## 2017-09-13 LAB — COMPREHENSIVE METABOLIC PANEL
ALBUMIN: 1.9 g/dL — AB (ref 3.5–5.0)
ALBUMIN: 1.5 g/dL — AB (ref 3.5–5.0)
ALT: 86 U/L — ABNORMAL HIGH (ref 14–54)
ALT: 76 U/L — AB (ref 14–54)
ANION GAP: 9 (ref 5–15)
AST: 103 U/L — ABNORMAL HIGH (ref 15–41)
AST: 109 U/L — AB (ref 15–41)
Alkaline Phosphatase: 215 U/L — ABNORMAL HIGH (ref 38–126)
Alkaline Phosphatase: 149 U/L — ABNORMAL HIGH (ref 38–126)
Anion gap: 8 (ref 5–15)
BUN: 39 mg/dL — AB (ref 6–20)
BUN: 44 mg/dL — AB (ref 6–20)
CALCIUM: 8.6 mg/dL — AB (ref 8.9–10.3)
CHLORIDE: 124 mmol/L — AB (ref 101–111)
CO2: 16 mmol/L — ABNORMAL LOW (ref 22–32)
CO2: 15 mmol/L — AB (ref 22–32)
CREATININE: 3.06 mg/dL — AB (ref 0.44–1.00)
CREATININE: 3.35 mg/dL — AB (ref 0.44–1.00)
Calcium: 7.4 mg/dL — ABNORMAL LOW (ref 8.9–10.3)
Chloride: 119 mmol/L — ABNORMAL HIGH (ref 101–111)
GFR calc Af Amer: 16 mL/min — ABNORMAL LOW (ref >60)
GFR calc Af Amer: 14 mL/min — ABNORMAL LOW (ref >60)
GFR calc non Af Amer: 14 mL/min — ABNORMAL LOW (ref >60)
GFR calc non Af Amer: 12 mL/min — ABNORMAL LOW (ref >60)
GLUCOSE: 125 mg/dL — AB (ref 65–99)
Glucose, Bld: 110 mg/dL — ABNORMAL HIGH (ref 65–99)
POTASSIUM: 4.6 mmol/L (ref 3.5–5.1)
POTASSIUM: 4.2 mmol/L (ref 3.5–5.1)
SODIUM: 147 mmol/L — AB (ref 135–145)
Sodium: 144 mmol/L (ref 135–145)
TOTAL PROTEIN: 5.7 g/dL — AB (ref 6.5–8.1)
Total Bilirubin: 15 mg/dL — ABNORMAL HIGH (ref 0.3–1.2)
Total Bilirubin: 11.2 mg/dL — ABNORMAL HIGH (ref 0.3–1.2)
Total Protein: 4.1 g/dL — ABNORMAL LOW (ref 6.5–8.1)

## 2017-09-13 LAB — CBC
HCT: 16.3 % — ABNORMAL LOW (ref 36.0–46.0)
Hemoglobin: 5.7 g/dL — CL (ref 12.0–15.0)
MCH: 28.4 pg (ref 26.0–34.0)
MCHC: 35 g/dL (ref 30.0–36.0)
MCV: 81.1 fL (ref 78.0–100.0)
Platelets: 227 10*3/uL (ref 150–400)
RBC: 2.01 MIL/uL — AB (ref 3.87–5.11)
RDW: 18.5 % — ABNORMAL HIGH (ref 11.5–15.5)
WBC: 10.6 10*3/uL — AB (ref 4.0–10.5)

## 2017-09-13 LAB — CBC WITH DIFFERENTIAL/PLATELET
Basophils Absolute: 0 10*3/uL (ref 0.0–0.1)
Basophils Relative: 0 %
Eosinophils Absolute: 0 10*3/uL (ref 0.0–0.7)
Eosinophils Relative: 0 %
HCT: 22.5 % — ABNORMAL LOW (ref 36.0–46.0)
Hemoglobin: 7.7 g/dL — ABNORMAL LOW (ref 12.0–15.0)
Lymphocytes Relative: 6 %
Lymphs Abs: 0.6 10*3/uL — ABNORMAL LOW (ref 0.7–4.0)
MCH: 27.2 pg (ref 26.0–34.0)
MCHC: 34.2 g/dL (ref 30.0–36.0)
MCV: 79.5 fL (ref 78.0–100.0)
Monocytes Absolute: 0.7 10*3/uL (ref 0.1–1.0)
Monocytes Relative: 7 %
Neutro Abs: 8.5 10*3/uL — ABNORMAL HIGH (ref 1.7–7.7)
Neutrophils Relative %: 87 %
Platelets: 311 10*3/uL (ref 150–400)
RBC: 2.83 MIL/uL — ABNORMAL LOW (ref 3.87–5.11)
RDW: 18 % — ABNORMAL HIGH (ref 11.5–15.5)
WBC: 9.8 10*3/uL (ref 4.0–10.5)

## 2017-09-13 LAB — LACTIC ACID, PLASMA
LACTIC ACID, VENOUS: 2.3 mmol/L — AB (ref 0.5–1.9)
Lactic Acid, Venous: 8 mmol/L (ref 0.5–1.9)

## 2017-09-13 LAB — BLOOD GAS, ARTERIAL
ACID-BASE DEFICIT: 18.3 mmol/L — AB (ref 0.0–2.0)
Acid-base deficit: 13.5 mmol/L — ABNORMAL HIGH (ref 0.0–2.0)
Bicarbonate: 12.8 mmol/L — ABNORMAL LOW (ref 20.0–28.0)
Bicarbonate: 9.7 mmol/L — ABNORMAL LOW (ref 20.0–28.0)
DRAWN BY: 295031
Drawn by: 232811
O2 CONTENT: 2 L/min
O2 Content: 2 L/min
O2 SAT: 92 %
O2 Saturation: 95.6 %
PATIENT TEMPERATURE: 93.8
PCO2 ART: 33.2 mmHg (ref 32.0–48.0)
PCO2 ART: 28.7 mmHg — AB (ref 32.0–48.0)
PH ART: 7.212 — AB (ref 7.350–7.450)
Patient temperature: 37
pH, Arterial: 7.136 — CL (ref 7.350–7.450)
pO2, Arterial: 93.7 mmHg (ref 83.0–108.0)
pO2, Arterial: 65.2 mmHg — ABNORMAL LOW (ref 83.0–108.0)

## 2017-09-13 LAB — GLUCOSE, CAPILLARY
Glucose-Capillary: 106 mg/dL — ABNORMAL HIGH (ref 65–99)
Glucose-Capillary: 108 mg/dL — ABNORMAL HIGH (ref 65–99)
Glucose-Capillary: 115 mg/dL — ABNORMAL HIGH (ref 65–99)
Glucose-Capillary: 211 mg/dL — ABNORMAL HIGH (ref 65–99)

## 2017-09-13 LAB — PROTIME-INR
INR: 1.5
PROTHROMBIN TIME: 18 s — AB (ref 11.4–15.2)

## 2017-09-13 LAB — PREPARE RBC (CROSSMATCH)

## 2017-09-13 LAB — ABO/RH: ABO/RH(D): O POS

## 2017-09-13 MED ORDER — LEVOTHYROXINE SODIUM 100 MCG IV SOLR
50.0000 ug | Freq: Every day | INTRAVENOUS | Status: DC
  Administered 2017-09-14 – 2017-09-16 (×3): 50 ug via INTRAVENOUS
  Filled 2017-09-13 (×3): qty 5

## 2017-09-13 MED ORDER — MIDAZOLAM HCL 2 MG/2ML IJ SOLN
INTRAMUSCULAR | Status: AC | PRN
  Administered 2017-09-13 (×3): 0.5 mg via INTRAVENOUS
  Administered 2017-09-13: 1 mg via INTRAVENOUS
  Administered 2017-09-13: 0.5 mg via INTRAVENOUS
  Administered 2017-09-13: 1 mg via INTRAVENOUS

## 2017-09-13 MED ORDER — FENTANYL CITRATE (PF) 100 MCG/2ML IJ SOLN
INTRAMUSCULAR | Status: AC
  Filled 2017-09-13: qty 4

## 2017-09-13 MED ORDER — PIPERACILLIN-TAZOBACTAM 3.375 G IVPB 30 MIN
3.3750 g | Freq: Once | INTRAVENOUS | Status: AC
  Administered 2017-09-13: 3.375 g via INTRAVENOUS

## 2017-09-13 MED ORDER — IOPAMIDOL (ISOVUE-300) INJECTION 61%
100.0000 mL | Freq: Once | INTRAVENOUS | Status: AC | PRN
  Administered 2017-09-13: 100 mL via INTRA_ARTERIAL

## 2017-09-13 MED ORDER — SODIUM CHLORIDE 0.9 % IV SOLN
Freq: Once | INTRAVENOUS | Status: AC
  Administered 2017-09-13: 18:00:00 via INTRAVENOUS

## 2017-09-13 MED ORDER — NALOXONE HCL 0.4 MG/ML IJ SOLN
0.4000 mg | Freq: Once | INTRAMUSCULAR | Status: AC
  Administered 2017-09-13: 0.4 mg via INTRAMUSCULAR

## 2017-09-13 MED ORDER — IOPAMIDOL (ISOVUE-300) INJECTION 61%
INTRAVENOUS | Status: AC
  Filled 2017-09-13: qty 100

## 2017-09-13 MED ORDER — IOPAMIDOL (ISOVUE-300) INJECTION 61%
100.0000 mL | Freq: Once | INTRAVENOUS | Status: AC | PRN
  Administered 2017-09-13: 16:00:00 via INTRA_ARTERIAL

## 2017-09-13 MED ORDER — MIDAZOLAM HCL 2 MG/2ML IJ SOLN
INTRAMUSCULAR | Status: AC
  Filled 2017-09-13: qty 4

## 2017-09-13 MED ORDER — NOREPINEPHRINE BITARTRATE 1 MG/ML IV SOLN
0.0000 ug/min | INTRAVENOUS | Status: DC
  Filled 2017-09-13: qty 4

## 2017-09-13 MED ORDER — IOPAMIDOL (ISOVUE-300) INJECTION 61%
INTRAVENOUS | Status: AC
  Administered 2017-09-13: 100 mL via INTRA_ARTERIAL
  Filled 2017-09-13: qty 200

## 2017-09-13 MED ORDER — SODIUM CHLORIDE 0.9 % IV BOLUS (SEPSIS)
1000.0000 mL | Freq: Once | INTRAVENOUS | Status: AC
  Administered 2017-09-13: 1000 mL via INTRAVENOUS

## 2017-09-13 MED ORDER — SODIUM BICARBONATE 8.4 % IV SOLN
INTRAVENOUS | Status: AC
  Filled 2017-09-13: qty 50

## 2017-09-13 MED ORDER — PIPERACILLIN-TAZOBACTAM 3.375 G IVPB
INTRAVENOUS | Status: AC
  Administered 2017-09-13: 3.375 g via INTRAVENOUS
  Filled 2017-09-13: qty 50

## 2017-09-13 MED ORDER — LIDOCAINE HCL 1 % IJ SOLN
INTRAMUSCULAR | Status: AC
  Filled 2017-09-13: qty 20

## 2017-09-13 MED ORDER — ATROPINE SULFATE 1 MG/10ML IJ SOSY
PREFILLED_SYRINGE | INTRAMUSCULAR | Status: AC
  Filled 2017-09-13: qty 10

## 2017-09-13 MED ORDER — NALOXONE HCL 0.4 MG/ML IJ SOLN
INTRAMUSCULAR | Status: AC
  Filled 2017-09-13: qty 1

## 2017-09-13 MED ORDER — MORPHINE SULFATE (PF) 4 MG/ML IV SOLN
2.0000 mg | Freq: Four times a day (QID) | INTRAVENOUS | Status: DC | PRN
  Administered 2017-09-13: 2 mg via INTRAVENOUS
  Filled 2017-09-13: qty 1

## 2017-09-13 MED ORDER — SODIUM CHLORIDE 0.9 % IV SOLN
INTRAVENOUS | Status: DC
  Administered 2017-09-13: 11:00:00 via INTRAVENOUS

## 2017-09-13 MED ORDER — LIDOCAINE HCL 1 % IJ SOLN
INTRAMUSCULAR | Status: AC | PRN
  Administered 2017-09-13 (×2): 10 mL
  Administered 2017-09-13: 20 mL

## 2017-09-13 MED ORDER — IOPAMIDOL (ISOVUE-300) INJECTION 61%
50.0000 mL | Freq: Once | INTRAVENOUS | Status: AC | PRN
  Administered 2017-09-13: 22 mL via INTRAVENOUS

## 2017-09-13 MED ORDER — IOPAMIDOL (ISOVUE-300) INJECTION 61%
100.0000 mL | Freq: Once | INTRAVENOUS | Status: AC | PRN
  Administered 2017-09-13: 80 mL via INTRA_ARTERIAL

## 2017-09-13 MED ORDER — FLUMAZENIL 0.5 MG/5ML IV SOLN
0.2000 mg | Freq: Once | INTRAVENOUS | Status: AC
  Administered 2017-09-13: 0.2 mg via INTRAVENOUS

## 2017-09-13 MED ORDER — FENTANYL CITRATE (PF) 100 MCG/2ML IJ SOLN
INTRAMUSCULAR | Status: AC | PRN
  Administered 2017-09-13 (×2): 25 ug via INTRAVENOUS
  Administered 2017-09-13: 50 ug via INTRAVENOUS
  Administered 2017-09-13: 25 ug via INTRAVENOUS
  Administered 2017-09-13: 50 ug via INTRAVENOUS
  Administered 2017-09-13: 25 ug via INTRAVENOUS

## 2017-09-13 MED ORDER — IOPAMIDOL (ISOVUE-300) INJECTION 61%
INTRAVENOUS | Status: AC
  Filled 2017-09-13: qty 50

## 2017-09-13 MED ORDER — NOREPINEPHRINE 4 MG/250ML-% IV SOLN
0.0000 ug/min | INTRAVENOUS | Status: DC
  Administered 2017-09-13: 2 ug/min via INTRAVENOUS
  Administered 2017-09-13: 40 ug/min via INTRAVENOUS
  Administered 2017-09-14: 50 ug/min via INTRAVENOUS
  Filled 2017-09-13 (×5): qty 250

## 2017-09-13 MED ORDER — GELATIN ABSORBABLE 12-7 MM EX MISC
CUTANEOUS | Status: AC
  Filled 2017-09-13: qty 1

## 2017-09-13 MED ORDER — FLUMAZENIL 0.5 MG/5ML IV SOLN
INTRAVENOUS | Status: AC
  Filled 2017-09-13: qty 5

## 2017-09-13 MED ORDER — PIPERACILLIN-TAZOBACTAM IN DEX 2-0.25 GM/50ML IV SOLN
2.2500 g | Freq: Four times a day (QID) | INTRAVENOUS | Status: DC
  Administered 2017-09-14 – 2017-09-15 (×6): 2.25 g via INTRAVENOUS
  Filled 2017-09-13 (×9): qty 50

## 2017-09-13 MED ORDER — ATROPINE SULFATE 1 MG/10ML IJ SOSY
1.0000 mg | PREFILLED_SYRINGE | Freq: Once | INTRAMUSCULAR | Status: AC
  Administered 2017-09-13: 1 mg via INTRAVENOUS

## 2017-09-13 MED ORDER — LACTATED RINGERS IV BOLUS (SEPSIS): 2000.0000 mL | Freq: Once | INTRAVENOUS | Status: DC

## 2017-09-13 MED ORDER — SODIUM BICARBONATE 8.4 % IV SOLN
INTRAVENOUS | Status: DC
  Administered 2017-09-13 – 2017-09-14 (×2): via INTRAVENOUS
  Filled 2017-09-13 (×5): qty 1000

## 2017-09-13 NOTE — Discharge Instructions (Signed)
Moderate Conscious Sedation, Adult, Care After  These instructions provide you with information about caring for yourself after your procedure. Your health care provider may also give you more specific instructions. Your treatment has been planned according to current medical practices, but problems sometimes occur. Call your health care provider if you have any problems or questions after your procedure.  What can I expect after the procedure?  After your procedure, it is common:   To feel sleepy for several hours.   To feel clumsy and have poor balance for several hours.   To have poor judgment for several hours.   To vomit if you eat too soon.    Follow these instructions at home:  For at least 24 hours after the procedure:     Do not:  ? Participate in activities where you could fall or become injured.  ? Drive.  ? Use heavy machinery.  ? Drink alcohol.  ? Take sleeping pills or medicines that cause drowsiness.  ? Make important decisions or sign legal documents.  ? Take care of children on your own.   Rest.  Eating and drinking   Follow the diet recommended by your health care provider.   If you vomit:  ? Drink water, juice, or soup when you can drink without vomiting.  ? Make sure you have little or no nausea before eating solid foods.  General instructions   Have a responsible adult stay with you until you are awake and alert.   Take over-the-counter and prescription medicines only as told by your health care provider.   If you smoke, do not smoke without supervision.   Keep all follow-up visits as told by your health care provider. This is important.  Contact a health care provider if:   You keep feeling nauseous or you keep vomiting.   You feel light-headed.   You develop a rash.   You have a fever.  Get help right away if:   You have trouble breathing.  This information is not intended to replace advice given to you by your health care provider. Make sure you discuss any questions you have  with your health care provider.  Document Released: 05/23/2013 Document Revised: 01/05/2016 Document Reviewed: 11/22/2015  Elsevier Interactive Patient Education  2018 [employer]

## 2017-09-13 NOTE — Progress Notes (Signed)
Rapid response called, Dr. Lorrene Reid at bedside and examined, ICU/SD request made, BP 69/33 48-52 Pt stating having increased pain, MD called and IV fluid 1 L bolus administered, pt lethargic. Unit CN, RR-RN and MD at bedside, foley placed as ordered. SRP, RN

## 2017-09-13 NOTE — Progress Notes (Signed)
Hepatic arteriogram with poss embolization ordered by CCM on pt ASAP. Case d/w Dr. Chase Caller. Details/risks of procedure d/w pt's son with his understanding and consent. Drs. Yamagata/Shick aware.

## 2017-09-13 NOTE — Procedures (Signed)
Central Venous Catheter Insertion Procedure Note Chelsey Savage [id number] 03-10-1941  Procedure: Insertion of Central Venous Catheter Indications: Assessment of intravascular volume, Drug and/or fluid administration and Frequent blood sampling  Procedure Details Consent: Unable to obtain consent because of emergent medical necessity. Time Out: Verified patient identification, verified procedure, site/side was marked, verified correct patient position, special equipment/implants available, medications/allergies/relevent history reviewed, required imaging and test results available.  Performed  Maximum sterile technique was used including antiseptics, cap, gloves, gown, hand hygiene, mask and sheet. Skin prep: Chlorhexidine; local anesthetic administered A antimicrobial bonded/coated triple lumen catheter was placed in the left subclavian vein using the Seldinger technique.  Evaluation Blood flow good Complications: No apparent complications Patient did tolerate procedure well. Chest X-ray ordered to verify placement.  CXR: normal.  Chelsey Savage 09/13/2017, 4:46 PM  Erick Colace ACNP-BC [facility] Pager # [phone] OR # [phone] if no answer

## 2017-09-13 NOTE — Progress Notes (Signed)
[facility] Progress Note Patient Name: Chelsey Savage DOB: March 02, 1941 MRN: [id number]   Date of Service  09/13/2017  HPI/Events of Note  Multiple issues: 1. Hypotension - BP = 74/15 and 2. Lactic Acid = 8.0. Patient has orders to get another 1 units PRBC for Hgb = 5.7. CVP = 6.  eICU Interventions  Will order: 1. Increase ceiling on Norepinephrine IV infusion to 60 mcg/min. 2. 0.9 NaCl 1 liter IV over 1 hour now.  3. ABG STAT. 4. Transfuse PRBC as ordered.      Intervention Category Major Interventions: Hypotension - evaluation and management;Acid-Base disturbance - evaluation and management  Sommer,Steven Eugene 09/13/2017, 9:58 PM

## 2017-09-13 NOTE — Progress Notes (Signed)
PROGRESS NOTE    Chelsey Savage  VZD:[id number] DOB: Sep 15, 1940 DOA: 08/28/2017 PCP: Minette Brine   Brief Narrative: Patient is a 77 year old female with past medical history of hypertension, hypothyroidism, chronic kidney disease, liver abscess, history of breast cancer status post right mastectomy who presented to the emergency department with complaints of fatigue and weakness.  Patient also reported of having black tarry stools.  She was found to be icteric.  Her liver enzymes were elevated on presentation with cholestatic pattern.  Ultrasound of the gallbladder showed intrahepatic and extrahepatic biliary dilatation with possible distal obstruction.  Gallstones were present.  Patient denies any abdominal pain.  Patient was admitted for further workup for jaundice and possible GI bleed.  GI consulted. Patient also found to have profound acute kidney injury on CKD. MRCP done on 09/11/17  shows  gallbladder mass. Underwent ERCP on 09/07/2017.  Currently the plan is Percutaneous Biliary drainage and tumor biopsy by IR today.  Assessment & Plan:   Principal Problem:   Jaundice Active Problems:   Hypertension   Hypothyroid   Acute kidney injury superimposed on chronic kidney disease (HCC)   Liver mass   ARF (acute renal failure) (HCC)   Gallbladder mass   Abnormal LFTs  Jaundice/gallstones/cholestatic pattern/gallbladder mass:  ERCP done on 09/13/17. Plan is  Percutaneous Biliary drainage and percutaneous biopsy of the gallbladder/liver mass by IR today. We should follow-up the biopsy report and call consult for oncology if it shows malignancy.  MRCP report:Large 7 x 8 x 9 cm mass centered in the inferior right liver, incorporating the mid gallbladder and extending anteriorly and cranially up into the medial segment of the left liver. Mass obstructs the right and left biliary ducts at the level of the confluence and the gallbladder fundus is distended and stone filled. 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 Hepatoduodenal ligament lymphadenopathy versus direct tumor extension into the hepatoduodenal ligament.  Right upper quadrant sonogram :Possible distal obstruction with external and internal biliary duct dilation.  Patient denies any abdomen pain.  Elevated liver enzymes with cholestatic pattern.   Currently also  on Zosyn for covering possible cholangitis.  She has remained afebrile.  Her white cell counts are stable.  We can consider discontinuing antibiotics in 1-2 days. She also has history of recurrent dysphagia now resolved with PPI.Marland Kitchen  Acute kidney injury on CKD: Did not improved with IV fluids.  Requested for nephrology consultation today. Last creatinine in 2017 was 1.6 which could be her baseline.  She follows with nephrologist as an outpatient.  Found to be severely dehydrated with acute kidney injury on presentation.  Patient was having severe loss of appetite and decreased oral intake at home. ARB and hydrochlorothiazide on hold.  Anemia with possible GI bleed: Started on Protonix.  Reported of having black tarry stools at home.  FOBT done in the emergency department was positive.  We will continue to monitor H&H.We will transfuse as necessary. GI will address this issue after biopsy of gallbladder/liver mass  Hypertension: BP now borderline low. We will continue to monitor.Continue IV fluids  Hypothyroidism: Continue Synthyroid.    Left ear ache: Patient suspected of having an insect inside her left ear in ED.  ENT consulted and  found to have just a small wax material in the left ear.  DVT prophylaxis: SCD Code Status: Full Family Communication: None present at the bedside Disposition Plan:  Home, unknown at this time  Consultants: Gastroenterology,IR  Procedures:  None  Antimicrobials: Zosyn since 09/09/17  Subjective: Patient seen and examined the bedside this morning.  Continues to remain weak.  No significant changes/events since yesterday.  Undergoing biopsy today.  Objective: Vitals:   09/13/17 1330 09/13/17 1335 09/13/17 1340 09/13/17 1345  BP: (!) 84/50 (!) 93/44 (!) 95/37 (!) 95/42  Pulse: 63 60 63 63  Resp: (!) 21 20 19 19   Temp:      TempSrc:      SpO2: 100% 100% 100% 100%  Weight:      Height:        Intake/Output Summary (Last 24 hours) at 09/13/2017 1410 Last data filed at 09/13/2017 0757 Gross per 24 hour  Intake 2058.33 ml  Output 850 ml  Net 1208.33 ml   Filed Weights   09/09/17 1629 08/18/2017 0439 09/13/17 0613  Weight: 96.1 kg (211 lb 12.8 oz) 100.4 kg (221 lb 6.4 oz) 102.3 kg (225 lb 9.6 oz)    Examination:  General exam: Appears calm, not in obvious distress, obese, dry mucous membranes Respiratory system: Bilateral equal air entry, normal vesicular breath sounds, no wheezes or crackles  Cardiovascular system: S1 & S2 heard, RRR. No JVD, murmurs, rubs, gallops or clicks.  Gastrointestinal system: Abdomen is  mildly distended, soft and nontender. No organomegaly or masses felt. Normal bowel sounds heard. Central nervous system: Alert and oriented. No focal neurological deficits. Extremities: No edema, no clubbing ,no cyanosis, distal peripheral pulses palpable. Skin: No cyanosis,No pallor,No Rash,No Ulcer Psychiatry: Looks slightly depressed,doesnot want to engage in conversation GU: No Foley    Data Reviewed: I have personally reviewed following labs and imaging studies  CBC: Recent Labs  Lab 09/09/17 1223 09/10/17 0457 09/11/17 0357 08/24/2017 0455 09/13/17 0505  WBC 9.9 8.8 9.5 11.1* 9.8  NEUTROABS 7.4 6.3 6.9 7.3 8.5*  HGB 8.6* 8.4* 7.5* 8.0* 7.7*  HCT 24.6* 24.2* 22.1* 23.7* 22.5*  MCV 79.1 79.9 82.2 81.7 79.5  PLT 361 332 314 321 528   Basic Metabolic Panel: Recent Labs  Lab 09/09/17 1223  09/10/17 0457 09/11/17 0357 09/11/2017 0455 09/13/17 0505  NA 143 142 141 143 144  K 4.5 4.6 4.3 4.0 4.6  CL 115* 115* 116* 117* 119*  CO2 18* 19* 18* 18* 16*  GLUCOSE 105* 91 105* 86 110*  BUN 44* 39* 35* 33* 39*  CREATININE 2.77* 2.63* 2.61* 2.45* 3.06*  CALCIUM 9.2 8.9 8.5* 9.0 8.6*   GFR: Estimated Creatinine Clearance: 18.5 mL/min (A) (by C-G formula based on SCr of 3.06 mg/dL (H)). Liver Function Tests: Recent Labs  Lab 09/09/17 1223 09/10/17 0457 09/11/17 0357 08/24/2017 0455 09/13/17 0505  AST 92* 77* 88* 112* 103*  ALT 108* 92* 84* 92* 86*  ALKPHOS 282* 236* 211* 205* 215*  BILITOT 14.4* 13.6* 13.0* 15.3* 15.0*  PROT 6.7 6.0* 5.5* 5.9* 5.7*  ALBUMIN 2.4* 2.2* 2.1* 2.0* 1.9*   Recent Labs  Lab 09/09/17 0102  LIPASE 23   No results for input(s): AMMONIA in the last 168 hours. Coagulation Profile: Recent Labs  Lab 08/20/2017 0455 09/13/17 0505  INR 1.36 1.50   Cardiac Enzymes: No results for input(s): CKTOTAL, CKMB, CKMBINDEX, TROPONINI in the last 168 hours. BNP (last 3 results) No results for input(s): PROBNP in the last 8760 hours. HbA1C: No results for input(s): HGBA1C in the last 72 hours. CBG: Recent Labs  Lab 09/11/2017 0620 09/04/2017 1441 08/28/2017 1726 09/13/17 0033 09/13/17 0554  GLUCAP  76 105* 107* 115* 108*   Lipid Profile: No results for input(s): CHOL, HDL, LDLCALC, TRIG, CHOLHDL, LDLDIRECT in the last 72 hours. Thyroid Function Tests: No results for input(s): TSH, T4TOTAL, FREET4, T3FREE, THYROIDAB in the last 72 hours. Anemia Panel: No results for input(s): VITAMINB12, FOLATE, FERRITIN, TIBC, IRON, RETICCTPCT in the last 72 hours. Sepsis Labs: No results for input(s): PROCALCITON, LATICACIDVEN in the last 168 hours.  Recent Results (from the past 240 hour(s))  Culture, Urine     Status: None   Collection Time: 09/11/17  8:39 AM  Result Value Ref Range Status   Specimen Description URINE, CLEAN CATCH  Final   Special Requests NONE   Final   Culture   Final    NO GROWTH Performed at [facility], [address], [place], [place] [address]    Report Status 09/06/2017 FINAL  Final         Radiology Studies: Dg C-arm 1-60 Min-no Report  Result Date: 08/30/2017 Fluoroscopy was utilized by the requesting physician.  No radiographic interpretation.        Scheduled Meds: . feeding supplement (ENSURE ENLIVE)  237 mL Oral BID BM  . fentaNYL      . gelatin adsorbable      . iopamidol      . levothyroxine  100 mcg Oral QAC breakfast  . lidocaine      . lidocaine      . midazolam      . pantoprazole (PROTONIX) IV  40 mg Intravenous Q12H   Continuous Infusions: . sodium chloride 100 mL/hr at 09/13/17 1034  . piperacillin-tazobactam (ZOSYN)  IV       LOS: 4 days    Time spent: 25 mins    Jamira Barfuss Jodie Echevaria, MD [facility] Pager [phone]  If 7PM-7AM, please contact night-coverage [email or web address] Password [email or web address] 09/13/2017, 2:10 PM

## 2017-09-13 NOTE — Consult Note (Signed)
PULMONARY / CRITICAL CARE MEDICINE   Name: Chelsey Savage MRN: [id number] DOB: 03-04-1941    ADMISSION DATE:  09/06/2017 CONSULTATION DATE: 1/29  REFERRING MD:  Tawanna Solo   CHIEF COMPLAINT:  Shock   HISTORY OF PRESENT ILLNESS:   77 year old female with no history of chronic kidney disease, hypertension, hypothyroidism, remote history of breast cancer.  Recently treated for a liver abscess.  Presented on 1/24 with chief complaint of weakness and decreased appetite.  In ER found to have acute kidney failure with creatinine rising from baseline of 1.6 up to 3, and increased liver function testing.  She had elevated total bili with jaundice on arrival.  Diagnostic evaluation demonstrated hepatic mass on MRI with intrahepatic biliary obstruction, the mass involve the right lobe of the liver, gallbladder and into the left lobe.  She had markedly dilated intrahepatic ducts as well as cholelithiasis.  He was seen in consultation by gastroenterology.  Working diagnosis was presumed either [place], gallbladder cancer, or cholangiocarcinoma.  She underwent ERCP on 1/28 where they found a tight biliary stricture with distal edge in the distal common bile duct they were unable to advance wire past the stricture.  Because of this interventional radiology was consulted for percutaneous biliary drainage as well as biopsy of mass.  On 1/29 she will underwent ultrasound-guided liver biopsy, as well as percutaneous transhepatic cholangiogram, unfortunately attempt at percutaneous drainage had to be aborted due to agitation.  The plan was to reevaluate this again on 1/30.  On return from interventional radiology she was hypotensive, encephalopathic, her blood pressure was as low as the 30Q systolic.  Fluid challenge was initiated and pulmonary critical care asked to evaluate.   She  has a past medical history of Allergy, Breast cancer ([place]), Chronic knee pain, Colon polyp, GERD (gastroesophageal reflux disease),  Hypertension, Hypothyroidism, Liver abscess, and Vertigo.  PAST SURGICAL HISTORY: She  has a past surgical history that includes Mastectomy (Right, 1979); Heel spur surgery (Right); and Tonsillectomy.  Allergies  Allergen Reactions  . Advil [Ibuprofen] Other (See Comments)    Kidney MD said not to take  . Aleve [Naproxen Sodium] Other (See Comments)    Kidney MD said not to take  . Naproxen Other (See Comments)    Kidney MD said not to take    No current facility-administered medications on file prior to encounter.    Current Outpatient Medications on File Prior to Encounter  Medication Sig  . acetaminophen (TYLENOL) 650 MG CR tablet Take 1,300 mg by mouth daily as needed for pain.  . Calcium Carbonate-Vitamin D (CALTRATE 600+D) 600-400 MG-UNIT per chew tablet Chew 1 tablet by mouth daily.   . cetirizine (ZYRTEC) 10 MG tablet Take 10 mg by mouth daily.  Marland Kitchen levothyroxine (SYNTHROID, LEVOTHROID) 100 MCG tablet Take 100 mcg by mouth daily before breakfast.  . multivitamin-lutein (OCUVITE-LUTEIN) CAPS capsule Take 1 capsule by mouth daily.  Marland Kitchen omeprazole (PRILOSEC) 20 MG capsule Take 1 capsule (20 mg total) by mouth daily.  . potassium chloride (MICRO-K) 10 MEQ CR capsule Take 10 mEq by mouth 2 (two) times daily.  . sodium chloride (OCEAN) 0.65 % SOLN nasal spray Place 2 sprays into both nostrils as needed for congestion.  . valsartan-hydrochlorothiazide (DIOVAN-HCT) 160-25 MG per tablet Take 1 tablet by mouth daily.     FAMILY HISTORY:  Her indicated that the status of her mother is unknown. She indicated that the status of her son is unknown.   SOCIAL HISTORY: She  reports that  she quit smoking about 20 years ago. Her smoking use included cigarettes. she has never used smokeless tobacco. She reports that she does not drink alcohol or use drugs.  REVIEW OF SYSTEMS:   Unable   SUBJECTIVE:  Lethargic   VITAL SIGNS: BP (!) 69/33 (BP Location: Left Arm)   Pulse (!) 53   Temp (!)  97.4 F (36.3 C) (Oral)   Resp (!) 22   Ht 5\' 5"  (1.651 m)   Wt 226 lb 10.1 oz (102.8 kg)   SpO2 100%   BMI 37.71 kg/m   HEMODYNAMICS:    VENTILATOR SETTINGS:    INTAKE / OUTPUT: I/O last 3 completed shifts: In: 3333.3 [I.V.:1983.3; IV Piggyback:1350] Out: 0998 [Urine:1525; Blood:10]  PHYSICAL EXAMINATION: General: Chronically ill-appearing 77 year old female currently acutely ill as well.  She is jaundiced, moaning, however oriented. Neuro: Lethargic, moaning, moves all extremities, alert oriented x3 HEENT: Normocephalic atraumatic sclera are icteric, no JVD mucous membranes moist Cardiovascular: Bradycardic, regular rate and rhythm, sinus bradycardia on telemetry Lungs: Clear, no accessory use, decreased bases Abdomen: Right upper quadrant pain to palpations dressings intact from recent procedure hypoactive bowel sounds Musculoskeletal: Equal strength and bulk Skin: Warm and dry, jaundice  LABS:  BMET Recent Labs  Lab 09/11/17 0357 09/04/2017 0455 09/13/17 0505  NA 141 143 144  K 4.3 4.0 4.6  CL 116* 117* 119*  CO2 18* 18* 16*  BUN 35* 33* 39*  CREATININE 2.61* 2.45* 3.06*  GLUCOSE 105* 86 110*    Electrolytes Recent Labs  Lab 09/11/17 0357 08/31/2017 0455 09/13/17 0505  CALCIUM 8.5* 9.0 8.6*    CBC Recent Labs  Lab 09/11/17 0357 09/15/2017 0455 09/13/17 0505  WBC 9.5 11.1* 9.8  HGB 7.5* 8.0* 7.7*  HCT 22.1* 23.7* 22.5*  PLT 314 321 311    Coag's Recent Labs  Lab 08/25/2017 0455 09/13/17 0505  INR 1.36 1.50    Sepsis Markers No results for input(s): LATICACIDVEN, PROCALCITON, O2SATVEN in the last 168 hours.  ABG Recent Labs  Lab 09/13/17 1545  PHART 7.212*  PCO2ART 33.2  PO2ART 93.7    Liver Enzymes Recent Labs  Lab 09/11/17 0357 08/21/2017 0455 09/13/17 0505  AST 88* 112* 103*  ALT 84* 92* 86*  ALKPHOS 211* 205* 215*  BILITOT 13.0* 15.3* 15.0*  ALBUMIN 2.1* 2.0* 1.9*    Cardiac Enzymes No results for input(s): TROPONINI,  PROBNP in the last 168 hours.  Glucose Recent Labs  Lab 09/15/2017 0620 08/18/2017 1441 08/20/2017 1726 09/13/17 0033 09/13/17 0554 09/13/17 1423  GLUCAP 76 105* 107* 115* 108* 106*    Imaging No results found.   STUDIES:  ERCP done on 09/13/17: not able to advance guidewire  MRCP report:Large 7 x 8 x 9 cm mass centered in the inferior right liver, incorporating the mid gallbladder and extending anteriorly and cranially up into the medial segment of the left liver. Mass obstructs the right and left biliary ducts at the level of the confluence and the gallbladder fundus is distended and stone filled. 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 Hepatoduodenal ligament lymphadenopathy versus direct tumor extension into the hepatoduodenal ligament.   CULTURES: BC x 2 1/29>>>  ANTIBIOTICS: Zosyn 1/24>>>  SIGNIFICANT EVENTS:   LINES/TUBES: Left [employer] CVL 1/29>>>>  DISCUSSION: This is a 77 year old female who presents with newly diagnosed large liver mass causing obstructive jaundice.  ERCP attempt: Successful for drainage at attempt  at percutaneous drain placement on 1/29.  Now in shock.  Highest on differential diagnosis include the following: Hemorrhagic shock from blood loss, acidosis, or finally cardiogenic etiology.  She has received aggressive volume resuscitation, we started a bicarbonate infusion, will continue antibiotics, a CBC, and CT abdomen/pelvis and pending.   ASSESSMENT / PLAN:  Circulatory shock etiology unclear, does not meet SIRS criteria currently. ddx: acidosis, bleeding (unsuccessful procedure today so high on ddx), drug related (got several doses of fent & versed) or sepsis or finally cardiogenic (possible given bradycardia) Plan Stat CBC, ABG, lactic acid.  Stat abd/pelvis r/o hematoma following  attempted perc drain Cycle CEs; consider ECHO Hold antihypertensives  Give narcan AND romazicon STAT Fluid challenge Type and screen for 2 units PRBCs Levophed gtt.  Will need central access.  MAP goal > 65 CVP goal 8-12 Agree w/ empiric abx zosyn #6 F/u cultures   Symptomatic bradycardia ? Acidosis?  Plan Atropine X 1 then levophed Tele Cycle CEs 12 lead   Obstructive jaundice from large hepatic mass involving the GB; w/ elevated LFTs  -unsuccessful ERCP 1/28 w/ plan to place perc drain -1/29 unsuccessful perc drain  -1/29 Liver bx obtained.  Plan NPO Plan for repeat attempt at perc  Biliary drain 1/30 Trend LFTs Further recs per GI   Acute encephalopathy: suspect metabolic in origin. Acidosis? Got both romazicon and narcan w/ little effect.  Plan Supportive care Check ammonia in am  No more sedating meds  Acute on chronic renal failure: initially improved. Baseline scr ~ 1.6. Felt 2/2 hypotension, had improved, now cr rising again up to cr 3.06 Plan Renal dose meds Fluid challenge Bicarb gtt (see below)) strick I&O MAP goal > 65  Metabolic acidosis primarily NAG: suspect mix of AKI and hyperchloremia Plan Push bicarb then start gtt Serial chemistries    Anemia.Marland Kitchen Appears chronic but concern about acute blood loss Plan Hold A/C Stat CBC Transfuse for Hgb < 7  Hypothyroidism Plan Cont synthroid (changing to IV at 1/2 dose)   DVT prophylaxis: scds SUP: PPI  Diet: NPO Activity: BR Disposition : ICU   FAMILY  - Updates:   - Inter-disciplinary family meet or Palliative Care meeting due by: 2/5  60 minutes Critical care time   Erick Colace ACNP-BC [place] Pager # [phone] OR # [phone] if no answer  09/13/2017, 4:31 PM

## 2017-09-13 NOTE — Consult Note (Signed)
[facility] Consultation Note Requesting Physician:  TRH Reason for Consult:  AKI on CKD  HPI: The patient is a 77 y.o. year-old female PMH significant for HTN, hypothyroidism, breast CA post R mastectomy, liver abscess in 2013, gallstones, baseline CKD4 (last available creatinine prior to admission was 1.63 in 04/2016). Presented to the ED with fatigue, loss of appetite. Notably jaundiced on physical exam. Labs significant for Hb 9.1, creatinine 3, abnormal LFT's incl bili of 15, stool heme +. Rec'd IVF in the ED w/creatinine down as low as 2.45. Had MRCP 1/26->large mass inferior R liver, involving gallbladder and medial L liver segment worrisome for malignancy. Had ERCP yesterday 1/28 unable to cross tight biliary stricture. Hypotension post procedure into the 80's and creatinine today back up to 3. We are asked to see.   Pt follows with Dr. Marval Regal as outpt - last seen in October - and at that time creatinine was 1.85 GFR 30 (CKD felt 2/2 HTN and prior AKI on CKD back in 2013)  Pt rec'd indocin suppository 1/28, tiny dose of contrast w/ERCP (3 ml?), no other nephrotoxins.   Just returned from IR biopsy, BP in the 60's, complaining of severe abdominal pain   Creatinine, Ser  Date/Time Value Ref Range Status  09/13/2017 05:05 AM 3.06 (H) 0.44 - 1.00 mg/dL Final  08/30/2017 04:55 AM 2.45 (H) 0.44 - 1.00 mg/dL Final  09/11/2017 03:57 AM 2.61 (H) 0.44 - 1.00 mg/dL Final  09/10/2017 04:57 AM 2.63 (H) 0.44 - 1.00 mg/dL Final  09/09/2017 12:23 PM 2.77 (H) 0.44 - 1.00 mg/dL Final  09/09/2017 01:02 AM 3.00 (H) 0.44 - 1.00 mg/dL Final  05/12/2016 02:38 AM 1.63 (H) 0.44 - 1.00 mg/dL Final  07/11/2015 09:34 AM 1.82 (H) 0.44 - 1.00 mg/dL Final  03/02/2014 04:55 PM 1.53 (H) 0.50 - 1.10 mg/dL Final  02/03/2014 12:04 PM 1.32 (H) 0.50 - 1.10 mg/dL Final  04/03/2012 07:51 AM 1.16 (H) 0.50 - 1.10 mg/dL Final  04/02/2012 06:54 AM 1.28 (H) 0.50 - 1.10 mg/dL Final  03/31/2012 03:43 AM 1.22 (H) 0.50 - 1.10 mg/dL  Final  03/30/2012 03:33 AM 1.22 (H) 0.50 - 1.10 mg/dL Final  03/29/2012 03:25 AM 1.37 (H) 0.50 - 1.10 mg/dL Final  03/28/2012 03:45 AM 1.49 (H) 0.50 - 1.10 mg/dL Final  03/27/2012 05:30 AM 1.84 (H) 0.50 - 1.10 mg/dL Final  03/26/2012 06:17 PM 2.32 (H) 0.50 - 1.10 mg/dL Final    Past Medical History:  Diagnosis Date  . Allergy   . Breast cancer ([place])    right  . Chronic knee pain    right  . Colon polyp   . GERD (gastroesophageal reflux disease)   . Hypertension   . Hypothyroidism   . Liver abscess   . Vertigo     Past Surgical History:  Procedure Laterality Date  . HEEL SPUR SURGERY Right   . MASTECTOMY Right 1979  . TONSILLECTOMY      Family History  Problem Relation Age of Onset  . Anuerysm Mother        type unknown  . Diabetes Son        x 2   Social History:  reports that she quit smoking about 20 years ago. Her smoking use included cigarettes. she has never used smokeless tobacco. She reports that she does not drink alcohol or use drugs.   Allergies  Allergen Reactions  . Advil [Ibuprofen] Other (See Comments)    Kidney MD said not to take  . Aleve [Naproxen  Sodium] Other (See Comments)    Kidney MD said not to take  . Naproxen Other (See Comments)    Kidney MD said not to take    Home medications: Prior to Admission medications   Medication Sig Start Date End Date Taking? Authorizing Provider  acetaminophen (TYLENOL) 650 MG CR tablet Take 1,300 mg by mouth daily as needed for pain.   Yes [provider]  Calcium Carbonate-Vitamin D (CALTRATE 600+D) 600-400 MG-UNIT per chew tablet Chew 1 tablet by mouth daily.    Yes [provider]  cetirizine (ZYRTEC) 10 MG tablet Take 10 mg by mouth daily.   Yes [provider]  levothyroxine (SYNTHROID, LEVOTHROID) 100 MCG tablet Take 100 mcg by mouth daily before breakfast.   Yes [provider]  multivitamin-lutein (OCUVITE-LUTEIN) CAPS capsule Take 1 capsule by mouth daily.    Yes [provider]  omeprazole (PRILOSEC) 20 MG capsule Take 1 capsule (20 mg total) by mouth daily. 08/30/17  Yes Isla Pence, MD  potassium chloride (MICRO-K) 10 MEQ CR capsule Take 10 mEq by mouth 2 (two) times daily.   Yes [provider]  sodium chloride (OCEAN) 0.65 % SOLN nasal spray Place 2 sprays into both nostrils as needed for congestion.   Yes [provider]  valsartan-hydrochlorothiazide (DIOVAN-HCT) 160-25 MG per tablet Take 1 tablet by mouth daily.  01/03/14  Yes [provider]    Inpatient medications: . feeding supplement (ENSURE ENLIVE)  237 mL Oral BID BM  . fentaNYL      . gelatin adsorbable      . iopamidol      . levothyroxine  100 mcg Oral QAC breakfast  . lidocaine      . lidocaine      . midazolam      . pantoprazole (PROTONIX) IV  40 mg Intravenous Q12H    Review of Systems Unable 2/2 severe abd pain  Physical Exam:  Blood pressure (!) 86/37, pulse 64, temperature (!) 97.5 F (36.4 C), temperature source Oral, resp. rate 17, height 5\' 5"  (1.651 m), weight 102.3 kg (225 lb 9.6 oz), SpO2 100 %.  Gen: Ill appearing AAF. C/o severe abdominal pain Deeply jaundiced Lines/tubes: PIV Skin: no rash, cyanosis Neck: no JVD, no bruits or LAN Chest: Ant clear Normal heart sounds S1S2 No S3 Abdomen quite. Dressing over bx site RUQ area. Marked tenderness to palpation Ext: no edema Neuro agitated, oriented, complaining of severe abd pain    Recent Labs  Lab 09/09/17 0102 09/09/17 1223 09/10/17 0457 09/11/17 0357 09/15/2017 0455 09/13/17 0505  NA 140 143 142 141 143 144  K 4.4 4.5 4.6 4.3 4.0 4.6  CL 109 115* 115* 116* 117* 119*  CO2 19* 18* 19* 18* 18* 16*  GLUCOSE 109* 105* 91 105* 86 110*  BUN 48* 44* 39* 35* 33* 39*  CREATININE 3.00* 2.77* 2.63* 2.61* 2.45* 3.06*  CALCIUM 9.7 9.2 8.9 8.5* 9.0 8.6*    Recent Labs  Lab 09/11/17 0357 09/14/2017 0455 09/13/17 0505  AST 88* 112* 103*  ALT 84* 92* 86*  ALKPHOS  211* 205* 215*  BILITOT 13.0* 15.3* 15.0*  PROT 5.5* 5.9* 5.7*  ALBUMIN 2.1* 2.0* 1.9*   Recent Labs  Lab 09/09/17 0102  LIPASE 23   Recent Labs  Lab 09/10/17 0457 09/11/17 0357 08/23/2017 0455 09/13/17 0505  WBC 8.8 9.5 11.1* 9.8  NEUTROABS 6.3 6.9 7.3 8.5*  HGB 8.4* 7.5* 8.0* 7.7*  HCT 24.2* 22.1* 23.7* 22.5*  MCV 79.9 82.2 81.7 79.5  PLT 332 314 321 311    Recent Labs  Lab 08/18/2017 0620 08/30/2017 1441 09/07/2017 1726 09/13/17 0033 09/13/17 0554  GLUCAP 76 105* 107* 115* 108*     Xrays/Other Studies: Dg C-arm 1-60 Min-no Report  Result Date: 08/26/2017 Fluoroscopy was utilized by the requesting physician.  No radiographic interpretation.    Background: 77 y.o. year-old female PMH significant for HTN, hypothyroidism, breast CA post R mastectomy, liver abscess in 2013, gallstones, baseline CKD4 (last available creatinine prior to admission was 1.63 in 04/2016). Presented to the ED with fatigue, loss of appetite. Notably jaundiced on physical exam. Labs significant for Hb 9.1, creatinine 3, abnormal LFT's incl bili of 15, stool heme +. Rec'd IVF in the ED w/creatinine down as low as 2.45. Had MRCP 1/26->large mass inferior R liver, involving gallbladder and medial L liver segment worrisome for malignancy. Had ERCP yesterday 1/28 unable to cross tight biliary stricture. Hypotension post procedure into the 80's and creatinine today back up to 3. We are asked to see.  Assessment/Recommendations  1. 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 I think volume depleted on admission and we were seeing some improvement, then hypotensive post ERCP 1/28 and BP now in the 60's on return from IR biopsy so likely ATN/hypoperfusion injury UA with some white cells.  1. Recommend transfer to ICU for current severe hypotension, get CCM involved, would favor central line for assistance in volume management, may  need pressor support 2. AKI likely to get much worse before it gets better but could not discuss her wishes for HD in the event of worsening renal failure as too much pain at time of my exam, not approp time to discuss 3. Start isotonic bicarb for evolving metabolic acidosis 4. Trend renal function and UOP 2. Liver/gallbladder mass - s/p IR liver bx and attempted perc transhepatic cholangiogram (unable to pass guidewire). IR indicates will re-attempt biliary drain next couple of days 3. Anemia - report of black stools. FOB +. On protonix. Trend lab 4. Hypothyroidism - on replacement   Jamal Maes,  MD [facility] [phone] pager 09/13/2017, 1:19 PM

## 2017-09-13 NOTE — Progress Notes (Signed)
Pt LOC decreased at this time, BP 65/33 pt arouse to name call and states she is tired. MD updated orders received. SRP, RN

## 2017-09-13 NOTE — Progress Notes (Signed)
CRITICAL VALUE ALERT  Critical Value: Lactic acid 8.0  Date & Time Notied:  09/13/17 2145  Provider Notified: CCM  Orders Received/Actions taken: fluid orders

## 2017-09-13 NOTE — Significant Event (Signed)
Rapid Response Event Note  Overview: Time Called: 1500 Event Type: Hypotension  Initial Focused Assessment: Pt lying in bed,moaning and groaning  c/o abd pain and wanting to turn on her side.   Pt has decrease in LOC, however will arouse to name and is oriented x4.  Marni Griffon ACNP and Dr Shanna Cisco present.     Interventions:Placed on monitor with sinus brady, HR 40's- 50's BP 60's, 02 sats 97% on 2l  IV fluid bolus infusing at 999.   Plan of Care (if not transferred): Urgent transfer to rm 1223. Pt remains bradycardic and hypotensive.   Event Summary:   at      at          Gwynn Burly

## 2017-09-13 NOTE — Progress Notes (Signed)
Patient status post ultrasound-guided liver mass biopsy and unsuccessful biliary drain placement today via IV conscious sedation; still lethargic with low BPs.  Transfered to ICU for critical care management.  Patient with epigastric and right upper abdominal pain with radiation to back.  Patient's family in room. Latest BP 73/45, heart rate 71; central line to be placed.  Romazicon, atropine and Narcan has been given.  Patient on Levophed drip.  Normal saline bolus was given.  Type and cross ordered. stat CT abdomen pelvis ordered.  Dr. Kathlene Cote updated.  On exam patient is awake but lethargic; does mumble some responses to few questions.  Has palpable right upper quadrant/ epigastric tenderness; puncture sites right upper quadrant clean and dry.  Await findings of CT.Family updated .May need hepatic angio with embo as last hgb 5.7.

## 2017-09-13 NOTE — Significant Event (Addendum)
Patient became hypotensive around 3 PM today  after she returned from IR guided biopsy.  Blood pressure was recorded around 60s/40s.  Normal saline bolus was ordered.  Patient appears to be very lethargic,she was also complaining of pain on the biopsy site.  Requested consult for critical care.  Patient will be transferred to stepdown unit.  She might need vasopressure support for maintenance of the blood pressure. Will order for lactic acid level, blood cultures.  Continue antibiotics and IV fluids. Called patient's son Mr. Zahriah Roes to update about the current situation.  Phone not received.  Voice message left.

## 2017-09-13 NOTE — Progress Notes (Addendum)
Son notified of change in condition,Spke with son and updated on Room status and that the MD will be provide update on status.  Pt transferred to 1223 report given to [place], Therapist, sports. Pt respond to name call. Handoff complete. SRP, RN

## 2017-09-13 NOTE — Procedures (Signed)
Acute hepatic hematoma s/p liver core bx  S/p MICRO COIL EMBO OF 2 RT HEPATIC ARTERY PERIPHERAL BRANCH PSAs  No comp Stable ebl 5cc Full report in pacs

## 2017-09-13 NOTE — Procedures (Signed)
Interventional Radiology Procedure Note  Procedure: 1) US guided liver biopsy; 2) Percutaneous transhepatic cholangiogram  Complications: None  Estimated Blood Loss: 10 mL  Findings: Under US guidance, 3 separate 18 G core biopsy samples obtained of large inferior right lobe hepatic mass via 17 G needle.  Gelfoam pledgets injected into tract after biopsy.  Under US guidance, access of right lobe intrahepatic ducts performed x 2 with 21 G needles.  Contrast injection shows biliary dilatation and distorted anatomy due to the large hepatic mass.  Unable to pass a guidewire antegrade after access of bile ducts.  After multiple attempts, procedure aborted as patient was becoming agitated and moving despite conscious sedation.  Will try to place biliary drain again tomorrow or Thursday.  Venetia Night. Kathlene Cote, M.D Pager:  [phone]

## 2017-09-13 NOTE — Progress Notes (Signed)
Pt returned from IR report received report from Dan, RN-  VS 103/40 HR 48-50 pt alert c/o of increased pain 8/10 over abd area, drsg sites intact. MD updated, orders received. Will cont to monitor. SRP, RN

## 2017-09-13 NOTE — Progress Notes (Signed)
1 unit of blood finished in IR. Will start next unit shortly.

## 2017-09-13 NOTE — Sedation Documentation (Signed)
5 Fr. Exoseal to right groin 

## 2017-09-13 NOTE — Progress Notes (Signed)
Medications administered by student RN 0700-1500 with supervision of Clinical Instructor Lynann Bologna MSN, RN-BC or patient's assigned RN, prior to transfer to ICU.

## 2017-09-14 ENCOUNTER — Encounter (HOSPITAL_COMMUNITY): Payer: Self-pay | Admitting: Interventional Radiology

## 2017-09-14 DIAGNOSIS — R160 Hepatomegaly, not elsewhere classified: Secondary | ICD-10-CM

## 2017-09-14 DIAGNOSIS — R578 Other shock: Secondary | ICD-10-CM

## 2017-09-14 LAB — CBC WITH DIFFERENTIAL/PLATELET
BASOS PCT: 0 %
Basophils Absolute: 0 10*3/uL (ref 0.0–0.1)
Basophils Absolute: 0 10*3/uL (ref 0.0–0.1)
Basophils Relative: 0 %
EOS ABS: 0 10*3/uL (ref 0.0–0.7)
EOS PCT: 0 %
Eosinophils Absolute: 0 10*3/uL (ref 0.0–0.7)
Eosinophils Relative: 0 %
HCT: 25.8 % — ABNORMAL LOW (ref 36.0–46.0)
HCT: 21.6 % — ABNORMAL LOW (ref 36.0–46.0)
HEMOGLOBIN: 7.7 g/dL — AB (ref 12.0–15.0)
Hemoglobin: 9 g/dL — ABNORMAL LOW (ref 12.0–15.0)
LYMPHS ABS: 1 10*3/uL (ref 0.7–4.0)
LYMPHS ABS: 1.3 10*3/uL (ref 0.7–4.0)
LYMPHS PCT: 4 %
LYMPHS PCT: 6 %
MCH: 28.3 pg (ref 26.0–34.0)
MCH: 28.3 pg (ref 26.0–34.0)
MCHC: 34.9 g/dL (ref 30.0–36.0)
MCHC: 35.6 g/dL (ref 30.0–36.0)
MCV: 81.1 fL (ref 78.0–100.0)
MCV: 79.4 fL (ref 78.0–100.0)
MONOS PCT: 9 %
Monocytes Absolute: 1.5 10*3/uL — ABNORMAL HIGH (ref 0.1–1.0)
Monocytes Absolute: 2 10*3/uL — ABNORMAL HIGH (ref 0.1–1.0)
Monocytes Relative: 6 %
NEUTROS ABS: 19.2 10*3/uL — AB (ref 1.7–7.7)
NEUTROS PCT: 90 %
NEUTROS PCT: 85 %
Neutro Abs: 23 10*3/uL — ABNORMAL HIGH (ref 1.7–7.7)
Platelets: 225 10*3/uL (ref 150–400)
Platelets: 171 10*3/uL (ref 150–400)
RBC: 3.18 MIL/uL — ABNORMAL LOW (ref 3.87–5.11)
RBC: 2.72 MIL/uL — AB (ref 3.87–5.11)
RDW: 16.4 % — AB (ref 11.5–15.5)
RDW: 16.8 % — ABNORMAL HIGH (ref 11.5–15.5)
WBC: 25.5 10*3/uL — ABNORMAL HIGH (ref 4.0–10.5)
WBC: 22.5 10*3/uL — AB (ref 4.0–10.5)
nRBC: 2 /100 WBC — ABNORMAL HIGH

## 2017-09-14 LAB — TROPONIN I
Troponin I: 0.05 ng/mL (ref <0.03)
Troponin I: 0.1 ng/mL (ref <0.03)

## 2017-09-14 LAB — BASIC METABOLIC PANEL
ANION GAP: 12 (ref 5–15)
Anion gap: 15 (ref 5–15)
BUN: 41 mg/dL — AB (ref 6–20)
BUN: 45 mg/dL — ABNORMAL HIGH (ref 6–20)
CHLORIDE: 111 mmol/L (ref 101–111)
CO2: 10 mmol/L — ABNORMAL LOW (ref 22–32)
CO2: 21 mmol/L — AB (ref 22–32)
CREATININE: 3.6 mg/dL — AB (ref 0.44–1.00)
CREATININE: 3.97 mg/dL — AB (ref 0.44–1.00)
Calcium: 6.8 mg/dL — ABNORMAL LOW (ref 8.9–10.3)
Calcium: 6.9 mg/dL — ABNORMAL LOW (ref 8.9–10.3)
Chloride: 116 mmol/L — ABNORMAL HIGH (ref 101–111)
GFR calc Af Amer: 13 mL/min — ABNORMAL LOW (ref >60)
GFR calc non Af Amer: 10 mL/min — ABNORMAL LOW (ref >60)
GFR, EST AFRICAN AMERICAN: 12 mL/min — AB (ref >60)
GFR, EST NON AFRICAN AMERICAN: 11 mL/min — AB (ref >60)
Glucose, Bld: 226 mg/dL — ABNORMAL HIGH (ref 65–99)
Glucose, Bld: 140 mg/dL — ABNORMAL HIGH (ref 65–99)
POTASSIUM: 4.4 mmol/L (ref 3.5–5.1)
POTASSIUM: 3.6 mmol/L (ref 3.5–5.1)
SODIUM: 141 mmol/L (ref 135–145)
SODIUM: 144 mmol/L (ref 135–145)

## 2017-09-14 LAB — PROTIME-INR
INR: 2.32
PROTHROMBIN TIME: 25.3 s — AB (ref 11.4–15.2)

## 2017-09-14 LAB — BLOOD GAS, ARTERIAL
ACID-BASE DEFICIT: 11.6 mmol/L — AB (ref 0.0–2.0)
Bicarbonate: 13.9 mmol/L — ABNORMAL LOW (ref 20.0–28.0)
DRAWN BY: 422461
O2 Content: 3 L/min
O2 Saturation: 96.3 %
PCO2 ART: 30.5 mmHg — AB (ref 32.0–48.0)
PH ART: 7.277 — AB (ref 7.350–7.450)
Patient temperature: 97.8
pO2, Arterial: 83.7 mmHg (ref 83.0–108.0)

## 2017-09-14 LAB — COMPREHENSIVE METABOLIC PANEL
ALT: 1645 U/L — ABNORMAL HIGH (ref 14–54)
ANION GAP: 12 (ref 5–15)
AST: 3689 U/L — ABNORMAL HIGH (ref 15–41)
Albumin: 1.7 g/dL — ABNORMAL LOW (ref 3.5–5.0)
Alkaline Phosphatase: 189 U/L — ABNORMAL HIGH (ref 38–126)
BUN: 42 mg/dL — ABNORMAL HIGH (ref 6–20)
CHLORIDE: 112 mmol/L — AB (ref 101–111)
CO2: 18 mmol/L — AB (ref 22–32)
Calcium: 7 mg/dL — ABNORMAL LOW (ref 8.9–10.3)
Creatinine, Ser: 3.56 mg/dL — ABNORMAL HIGH (ref 0.44–1.00)
GFR calc non Af Amer: 11 mL/min — ABNORMAL LOW (ref >60)
GFR, EST AFRICAN AMERICAN: 13 mL/min — AB (ref >60)
Glucose, Bld: 218 mg/dL — ABNORMAL HIGH (ref 65–99)
Potassium: 3.8 mmol/L (ref 3.5–5.1)
SODIUM: 142 mmol/L (ref 135–145)
Total Bilirubin: 14.7 mg/dL — ABNORMAL HIGH (ref 0.3–1.2)
Total Protein: 4.6 g/dL — ABNORMAL LOW (ref 6.5–8.1)

## 2017-09-14 LAB — BLOOD GAS, VENOUS
ACID-BASE DEFICIT: 2.7 mmol/L — AB (ref 0.0–2.0)
BICARBONATE: 22.6 mmol/L (ref 20.0–28.0)
Drawn by: 422461
O2 Content: 2 L/min
O2 Saturation: 51.3 %
PCO2 VEN: 44.3 mmHg (ref 44.0–60.0)
PH VEN: 7.327 (ref 7.250–7.430)
Patient temperature: 98.2

## 2017-09-14 LAB — CBC
HEMATOCRIT: 24.6 % — AB (ref 36.0–46.0)
Hemoglobin: 8.6 g/dL — ABNORMAL LOW (ref 12.0–15.0)
MCH: 28.7 pg (ref 26.0–34.0)
MCHC: 35 g/dL (ref 30.0–36.0)
MCV: 82 fL (ref 78.0–100.0)
PLATELETS: 214 10*3/uL (ref 150–400)
RBC: 3 MIL/uL — AB (ref 3.87–5.11)
RDW: 16.4 % — ABNORMAL HIGH (ref 11.5–15.5)
WBC: 25 10*3/uL — ABNORMAL HIGH (ref 4.0–10.5)

## 2017-09-14 LAB — GLUCOSE, CAPILLARY
Glucose-Capillary: 110 mg/dL — ABNORMAL HIGH (ref 65–99)
Glucose-Capillary: 126 mg/dL — ABNORMAL HIGH (ref 65–99)
Glucose-Capillary: 206 mg/dL — ABNORMAL HIGH (ref 65–99)

## 2017-09-14 LAB — LACTIC ACID, PLASMA
Lactic Acid, Venous: 4.9 mmol/L (ref 0.5–1.9)
Lactic Acid, Venous: 3.9 mmol/L (ref 0.5–1.9)

## 2017-09-14 LAB — MAGNESIUM: Magnesium: 1.5 mg/dL — ABNORMAL LOW (ref 1.7–2.4)

## 2017-09-14 LAB — PHOSPHORUS: Phosphorus: 4.7 mg/dL — ABNORMAL HIGH (ref 2.5–4.6)

## 2017-09-14 MED ORDER — MAGNESIUM SULFATE 2 GM/50ML IV SOLN
2.0000 g | Freq: Once | INTRAVENOUS | Status: AC
  Administered 2017-09-14: 2 g via INTRAVENOUS
  Filled 2017-09-14: qty 50

## 2017-09-14 MED ORDER — SODIUM BICARBONATE 8.4 % IV SOLN
INTRAVENOUS | Status: DC
  Administered 2017-09-14 – 2017-09-16 (×6): via INTRAVENOUS
  Filled 2017-09-14: qty 150
  Filled 2017-09-14: qty 50
  Filled 2017-09-14 (×4): qty 150

## 2017-09-14 MED ORDER — SODIUM BICARBONATE 8.4 % IV SOLN
100.0000 meq | Freq: Once | INTRAVENOUS | Status: AC
  Administered 2017-09-14: 100 meq via INTRAVENOUS
  Filled 2017-09-14: qty 50

## 2017-09-14 MED ORDER — SODIUM CHLORIDE 0.9% FLUSH
10.0000 mL | Freq: Two times a day (BID) | INTRAVENOUS | Status: DC
  Administered 2017-09-14 – 2017-09-16 (×5): 10 mL

## 2017-09-14 MED ORDER — SODIUM CHLORIDE 0.9% FLUSH: 10.0000 mL | INTRAVENOUS | Status: DC | PRN

## 2017-09-14 MED ORDER — FENTANYL CITRATE (PF) 100 MCG/2ML IJ SOLN
12.5000 ug | INTRAMUSCULAR | Status: DC | PRN
  Administered 2017-09-16: 12.5 ug via INTRAVENOUS
  Filled 2017-09-14: qty 2

## 2017-09-14 MED ORDER — ORAL CARE MOUTH RINSE
15.0000 mL | Freq: Two times a day (BID) | OROMUCOSAL | Status: DC
  Administered 2017-09-14 – 2017-09-15 (×4): 15 mL via OROMUCOSAL

## 2017-09-14 MED ORDER — LACTATED RINGERS IV BOLUS (SEPSIS)
500.0000 mL | Freq: Once | INTRAVENOUS | Status: AC
  Administered 2017-09-14: 500 mL via INTRAVENOUS

## 2017-09-14 MED ORDER — CHLORHEXIDINE GLUCONATE CLOTH 2 % EX PADS
6.0000 | MEDICATED_PAD | Freq: Every day | CUTANEOUS | Status: DC
  Administered 2017-09-14 – 2017-09-16 (×3): 6 via TOPICAL

## 2017-09-14 MED ORDER — NOREPINEPHRINE BITARTRATE 1 MG/ML IV SOLN
0.0000 ug/min | INTRAVENOUS | Status: DC
  Administered 2017-09-14: 50 ug/min via INTRAVENOUS
  Administered 2017-09-14: 25 ug/min via INTRAVENOUS
  Administered 2017-09-15: 17 ug/min via INTRAVENOUS
  Administered 2017-09-15: 43 ug/min via INTRAVENOUS
  Administered 2017-09-16: 47 ug/min via INTRAVENOUS
  Administered 2017-09-16 (×2): 60 ug/min via INTRAVENOUS
  Administered 2017-09-16: 55 ug/min via INTRAVENOUS
  Administered 2017-09-17: 47 ug/min via INTRAVENOUS
  Filled 2017-09-14 (×10): qty 16

## 2017-09-14 NOTE — Progress Notes (Signed)
[facility] Progress Note Patient Name: Chelsey Savage DOB: 1941-04-16 MRN: [id number]   Date of Service  09/14/2017  HPI/Events of Note  ABG = 7.136/28.7/65.2/9.7. Currently on D5 with 150 meq NaHCO3 at 125 mL/hour. Being transfused a total of 3 units PRBC. CVP = 11. BP = 97/33 with MAP = 55.  eICU Interventions  Will order: 1. CBC at 12:30 AM. 2. NaHCO3 100 meq IV now.  3. Repeat ABG at 3 AM.     Intervention Category Major Interventions: Acid-Base disturbance - evaluation and management  Sommer,Steven Eugene 09/14/2017, 12:11 AM

## 2017-09-14 NOTE — Progress Notes (Signed)
[facility] Progress Note    Since last GI note: Events yesterday noted.  She looks remarkably OK this morning. Has mild abd pains, mainly with coughing.    Objective: Vital signs in last 24 hours: Temp:  [93.8 F (34.3 C)-98.4 F (36.9 C)] 98.1 F (36.7 C) (01/30 0800) Pulse Rate:  [44-111] 87 (01/30 0830) Resp:  [12-35] 21 (01/30 0830) BP: (57-138)/(20-106) 104/54 (01/30 0830) SpO2:  [91 %-100 %] 96 % (01/30 0830) Weight:  [226 lb 10.1 oz (102.8 kg)-245 lb 6 oz (111.3 kg)] 245 lb 6 oz (111.3 kg) (01/30 0500) Last BM Date: 09/10/17(per patient) General: alert and oriented times 3 Heart: regular rate and rythm Abdomen: soft, non-tender, non-distended, normal bowel sounds   Lab Results: Recent Labs    09/13/17 1532 09/14/17 0200 09/14/17 0530  WBC 10.6* 25.0* 25.5*  HGB 5.7* 8.6* 9.0*  PLT 227 214 225  MCV 81.1 82.0 81.1   Recent Labs    09/13/17 1532 09/13/17 2200 09/14/17 0530  NA 147* 141 142  K 4.2 4.4 3.8  CL 124* 116* 112*  CO2 15* 10* 18*  GLUCOSE 125* 226* 218*  BUN 44* 41* 42*  CREATININE 3.35* 3.60* 3.56*  CALCIUM 7.4* 6.8* 7.0*   Recent Labs    09/13/17 0505 09/13/17 1532 09/14/17 0530  PROT 5.7* 4.1* 4.6*  ALBUMIN 1.9* 1.5* 1.7*  AST 103* 109* 3,689*  ALT 86* 76* 1,645*  ALKPHOS 215* 149* 189*  BILITOT 15.0* 11.2* 14.7*   Recent Labs    09/13/17 0505 09/14/17 0530  INR 1.50 2.32   Medications: Scheduled Meds: . Chlorhexidine Gluconate Cloth  6 each Topical Daily  . levothyroxine  50 mcg Intravenous Daily  . mouth rinse  15 mL Mouth Rinse BID  . pantoprazole (PROTONIX) IV  40 mg Intravenous Q12H  . sodium chloride flush  10-40 mL Intracatheter Q12H   Continuous Infusions: . magnesium sulfate 1 - 4 g bolus IVPB    . norepinephrine (LEVOPHED) Adult infusion 44 mcg/min (09/14/17 0700)  . piperacillin-tazobactam (ZOSYN)  IV Stopped (09/14/17 0558)  .  sodium bicarbonate  infusion 1000 mL     PRN Meds:.fentaNYL  (SUBLIMAZE) injection, [DISCONTINUED] ondansetron **OR** ondansetron (ZOFRAN) IV, sodium chloride flush    Assessment/Plan: 77 y.o. female with liver mass causing biliary obstruction; hemorrhagic shock following perc liver mass biopsyy yesterday.  I think the bleeding has stopped post angiogram, embolization of Hep artery branch.  Still on high dose levo for pressor support but that is being weaned.  Hb stable.  Ischemic injury to kidneys and liver, I suspect this will improve with continued supportive care.  Will probably need a few days recovery before another attempt at biliary drain placement by IR.  OK to start clear liquids, I will order.  Await path report.   Milus Banister, MD  09/14/2017, 10:06 AM [facility] Pager [phone]

## 2017-09-14 NOTE — Progress Notes (Signed)
[facility] Rounding Note  Subjective/Interval History:  Events noted Transferred to ICU/shock/Hb to 5.7 Required coil embolization of 2 R hepatic artery peripheral branches due to subcapsular bleed Transfused 2 units Bicarb drip for progressive acidosis On norepi at 47 weaning Awake and alert  Objective Vital signs in last 24 hours: Vitals:   09/14/17 0400 09/14/17 0402 09/14/17 0415 09/14/17 0500  BP: (!) 96/53  (!) 106/40   Pulse: 88  92   Resp: (!) 31  19   Temp:  98.1 F (36.7 C)    TempSrc:  Oral    SpO2: 100%  100%   Weight:    111.3 kg (245 lb 6 oz)  Height:       Weight change: 0.469 kg (1 lb 0.5 oz)  Intake/Output Summary (Last 24 hours) at 09/14/2017 0557 Last data filed at 09/14/2017 0553 Gross per 24 hour  Intake 5097.4 ml  Output 475 ml  Net 4622.4 ml   Physical Exam:  Blood pressure (!) 106/40, pulse 92, temperature 98.1 F (36.7 C), temperature source Oral, resp. rate 19, height 5\' 5"  (1.651 m), weight 111.3 kg (245 lb 6 oz), SpO2 100 %.  CVP 4 Awake, alert C/o some back pain RUQ pain improved Lungs clear Normal heart sounds No murmur Abd mod tenderness RUQ but improved No LE edema   Recent Labs  Lab 09/10/17 0457 09/11/17 0357 09/11/2017 0455 09/13/17 0505 09/13/17 1532 09/13/17 2200 09/14/17 0530  NA 142 141 143 144 147* 141 142  K 4.6 4.3 4.0 4.6 4.2 4.4 3.8  CL 115* 116* 117* 119* 124* 116* 112*  CO2 19* 18* 18* 16* 15* 10* 18*  GLUCOSE 91 105* 86 110* 125* 226* 218*  BUN 39* 35* 33* 39* 44* 41* 42*  CREATININE 2.63* 2.61* 2.45* 3.06* 3.35* 3.60* 3.56*  CALCIUM 8.9 8.5* 9.0 8.6* 7.4* 6.8* 7.0*  PHOS  --   --   --   --   --   --  4.7*    Recent Labs  Lab 08/16/2017 0455 09/13/17 0505 09/13/17 1532  AST 112* 103* 109*  ALT 92* 86* 76*  ALKPHOS 205* 215* 149*  BILITOT 15.3* 15.0* 11.2*  PROT 5.9* 5.7* 4.1*  ALBUMIN 2.0* 1.9* 1.5*   Recent Labs  Lab 09/09/17 0102  LIPASE 23    Recent Labs  Lab 09/10/17 0457 09/11/17 0357  09/15/2017 0455 09/13/17 0505 09/13/17 1532 09/14/17 0200  WBC 8.8 9.5 11.1* 9.8 10.6* 25.0*  NEUTROABS 6.3 6.9 7.3 8.5*  --   --   HGB 8.4* 7.5* 8.0* 7.7* 5.7* 8.6*  HCT 24.2* 22.1* 23.7* 22.5* 16.3* 24.6*  MCV 79.9 82.2 81.7 79.5 81.1 82.0  PLT 332 314 321 311 227 214   Recent Labs  Lab 09/13/17 2220  TROPONINI 0.05*    Recent Labs  Lab 09/13/17 0033 09/13/17 0554 09/13/17 1423 09/13/17 2344 09/14/17 0550  GLUCAP 115* 108* 106* 211* 206*   ABG    Component Value Date/Time   PHART 7.277 (L) 09/14/2017 0244   PCO2ART 30.5 (L) 09/14/2017 0244   PO2ART 83.7 09/14/2017 0244   HCO3 13.9 (L) 09/14/2017 0244   ACIDBASEDEF 11.6 (H) 09/14/2017 0244   O2SAT 96.3 09/14/2017 0244   Studies/Results: Ct Abdomen Pelvis Wo Contrast  Result Date: 09/13/2017 CLINICAL DATA:  S/p liver bx today. hgb dropped, blood pressure dropped. Right sided pain EXAM: CT ABDOMEN AND PELVIS WITHOUT CONTRAST TECHNIQUE: Multidetector CT imaging of the abdomen and pelvis was performed following the standard protocol  without IV contrast. COMPARISON:  MRI of the liver, MRCP, 09/10/2017 FINDINGS: Lower chest: Status post right mastectomy. Small right minimal left pleural effusions with associated mild dependent lower lobe atelectasis. Heart mildly enlarged. Hepatobiliary: There is complex fluid surrounding the liver consistent with hemorrhage. Bubbles of air are seen anterior to the gallbladder, within the right lobe mass/lesion. Dense contrast is seen within the dependent portions of the dilated intrahepatic bile ducts with a small amount of contrast noted in a normal caliber common bile duct. Multiple dependent gallstones are noted. Pancreas: No pancreatic masses. There is mild peripancreatic stranding, new since prior MRI. Spleen: Normal in size.  No mass or focal lesion. Adrenals/Urinary Tract: No adrenal masses. Bilateral renal cortical thinning. No renal masses. No stones. No hydronephrosis. Ureters are normal  course and in caliber. Bladder is decompressed with a Foley catheter. Stomach/Bowel: Stomach and small bowel unremarkable. Colon is normal in caliber. There are multiple left colon diverticula. No diverticulitis or other colonic inflammatory process. Normal appendix visualized. Vascular/Lymphatic: No discrete enlarged lymph nodes. There is aortic atherosclerosis. No aneurysm. Reproductive: Uterus and bilateral adnexa are unremarkable. Other: Hemoperitoneum. Complex fluid with areas of higher attenuation surrounds the liver, most evident anteriorly and laterally, extending inferiorly along the right pericolic gutter and anterior pararenal space into the pelvis. Lower attenuation fluid is seen adjacent to the spleen and along the left pericolic gutter. Musculoskeletal: No fracture or acute finding. No osteoblastic or osteolytic lesions. IMPRESSION: 1. Hemoperitoneum, most evident adjacent to the liver, which is presumably the source of bleeding, subsequent to the liver biopsy performed today. This is new since the prior MRI. 2. Small amount of air is seen within the right lobe mass adjacent to the gallbladder, presumed secondary to the liver biopsy. 3. Hazy inflammatory type changes now noted along the pancreas which is likely secondary to the liver hemorrhage. Electronically Signed   By: Lajean Manes M.D.   On: 09/13/2017 17:36   Dg Chest Port 1 View  Result Date: 09/13/2017 CLINICAL DATA:  Central line placement. EXAM: PORTABLE CHEST 1 VIEW COMPARISON:  09/09/2017 FINDINGS: New left subclavian central venous line has its tip projecting just above the caval atrial junction. No pneumothorax. Cardiac silhouette is normal in size. No mediastinal or hilar masses. Clear lungs. IMPRESSION: 1. New left subclavian central venous line is well positioned, tip projecting just above the caval atrial junction. 2. No pneumothorax.  No other change from the prior study. Electronically Signed   By: Lajean Manes M.D.   On:  09/13/2017 16:42   Dg C-arm 1-60 Min-no Report  Result Date: 09/13/2017 Fluoroscopy was utilized by the requesting physician.  No radiographic interpretation.   Medications: . dextrose 5 % 1,000 mL with sodium bicarbonate 150 mEq infusion 125 mL/hr at 09/14/17 0600  . norepinephrine (LEVOPHED) Adult infusion 47.04 mcg/min (09/14/17 0600)  . piperacillin-tazobactam (ZOSYN)  IV Stopped (09/14/17 0558)   . levothyroxine  50 mcg Intravenous Daily  . pantoprazole (PROTONIX) IV  40 mg Intravenous Q12H    Background: 77 y.o. year-old female PMH significant for HTN, hypothyroidism, breast CA post R mastectomy, liver abscess in 2013, gallstones, baseline CKD4 (last available creatinine prior to admission was 1.63 in 04/2016). Presented to the ED with fatigue, loss of appetite, jaundice.Labs significant for Hb 9.1, creatinine 3, abnormal LFT's incl bili of 15, stool heme +. Rec'd IVF w/creatinine down as low as 2.45. MRCP 1/26->large mass inferior R liver, gallbladder, medial L liver segment. ERCP 1/28 unable to cross tight  biliary stricture. Hypotension post procedure into the 80's and creatinine back up to 3. Then shock/subcapsular bleed requiring embolization 2 hep artery branches 1/29.  Assessment/Recommendations  1. 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 Initial hit was volume depletion, now shock 2/2 ABLA post liver bx, w/contrast for embolization of hepatic artery bleeders. Has developed severe metabolic acidosis requiring bicarb drip, BP remains soft on NE, making some urine, but not enough (probably, too keep up with obligatory intake); CVP still low and creatinine ? plateau. All that being said will see how evolves next 24 hours. May require CRRT which she would agree to if needed.    2. Metabolic acidosis - severe.bicarb drip started by CCM, plus additional bolus of 100 mEq. Last pH up to 7.2  3. Anemia -  post 2U transfusion for liver bleed Hb up to 8.6 4. Hepatic/gallbladder mass - post bx, then embolization of bleeders 5. Shock - ABLA. CVP 4 Vol repleting. NE at 53 6. Hypothyroidism   Jamal Maes, MD [facility] [phone] pager 09/14/2017, 5:57 AM

## 2017-09-14 NOTE — Progress Notes (Signed)
Pharmacy Antibiotic Note  Chelsey Savage is a 77 y.o. female admitted on 08/28/2017 with obstructive jaundice. Cholelithiasis and dilated intra and extrahepatic bile ducts on ultrasound. Pharmacy has been consulted for Zosyn dosing.  Plan: Continue Zosyn to 2.25g IV q6h for CrCl<20 ml/min.   Height: 5\' 5"  (165.1 cm) Weight: 245 lb 6 oz (111.3 kg) IBW/kg (Calculated) : 57  Temp (24hrs), Avg:96.7 F (35.9 C), Min:93.8 F (34.3 C), Max:98.4 F (36.9 C)  Recent Labs  Lab 08/29/2017 0455 09/13/17 0505 09/13/17 1532 09/13/17 1700 09/13/17 2000 09/13/17 2200 09/14/17 0200 09/14/17 0530 09/14/17 0600  WBC 11.1* 9.8 10.6*  --   --   --  25.0* 25.5*  --   CREATININE 2.45* 3.06* 3.35*  --   --  3.60*  --  3.56*  --   LATICACIDVEN  --   --   --  2.3* 8.0*  --   --   --  4.9*    Estimated Creatinine Clearance: 16.7 mL/min (A) (by C-G formula based on SCr of 3.56 mg/dL (H)).    Allergies  Allergen Reactions  . Advil [Ibuprofen] Other (See Comments)    Kidney MD said not to take  . Aleve [Naproxen Sodium] Other (See Comments)    Kidney MD said not to take  . Naproxen Other (See Comments)    Kidney MD said not to take    Antimicrobials this admission: 1/25 >> Zosyn >>  Dose adjustments this admission: 1/27: adjusted from 2.25g q6h to 3.375g q8h (extended infusion) for improvement in renal function 1/29 adjust back to 2.25g q6h for CrCl<20 ml/min  Thank you for allowing pharmacy to be a part of this patient's care.  Hershal Coria, PharmD, BCPS Pager: [phone] 09/14/2017 9:40 AM

## 2017-09-14 NOTE — Progress Notes (Signed)
PULMONARY / CRITICAL CARE MEDICINE   Name: Chelsey Savage MRN: [id number] DOB: December 02, 1940    ADMISSION DATE:  08/24/2017 CONSULTATION DATE: 1/29  REFERRING MD:  Tawanna Solo   CHIEF COMPLAINT:  Shock   HISTORY OF PRESENT ILLNESS:   77 year old female with no history of chronic kidney disease, hypertension, hypothyroidism, remote history of breast cancer.  Recently treated for a liver abscess.  Presented on 1/24 with chief complaint of weakness and decreased appetite.  In ER found to have acute kidney failure with creatinine rising from baseline of 1.6 up to 3, and increased liver function testing.  She had elevated total bili with jaundice on arrival.  Diagnostic evaluation demonstrated hepatic mass on MRI with intrahepatic biliary obstruction, the mass involve the right lobe of the liver, gallbladder and into the left lobe.  She had markedly dilated intrahepatic ducts as well as cholelithiasis.  He was seen in consultation by gastroenterology.  Working diagnosis was presumed either [place], gallbladder cancer, or cholangiocarcinoma.  She underwent ERCP on 1/28 where they found a tight biliary stricture with distal edge in the distal common bile duct they were unable to advance wire past the stricture.  Because of this interventional radiology was consulted for percutaneous biliary drainage as well as biopsy of mass.  On 1/29 she will underwent ultrasound-guided liver biopsy, as well as percutaneous transhepatic cholangiogram, unfortunately attempt at percutaneous drainage had to be aborted due to agitation.  The plan was to reevaluate this again on 1/30.  On return from interventional radiology she was hypotensive, encephalopathic, her blood pressure was as low as the 00X systolic.  Fluid challenge was initiated and pulmonary critical care asked to evaluate.   SUBJECTIVE:  Looks better  VITAL SIGNS: BP (!) 104/54   Pulse 87   Temp 98.1 F (36.7 C) (Oral)   Resp (!) 21   Ht 5\' 5"  (1.651 m)   Wt  245 lb 6 oz (111.3 kg)   SpO2 96%   BMI 40.83 kg/m   HEMODYNAMICS: CVP:  [3 mmHg-19 mmHg] 4 mmHg  VENTILATOR SETTINGS:    INTAKE / OUTPUT:  Intake/Output Summary (Last 24 hours) at 09/14/2017 3818 Last data filed at 09/14/2017 0700 Gross per 24 hour  Intake 5597.05 ml  Output 275 ml  Net 5322.05 ml     PHYSICAL EXAMINATION: General: 77 year old female currently lying in bed, more awake, appropriate, complaining of abdominal discomfort but otherwise looks much improved when compared with 1/29 HEENT: Normocephalic atraumatic mucous membranes are moist.  Her sclerae remain icteric.  No neck vein distention. Pulmonary: Clear to auscultation no accessory use Cardiac: Regular rate and rhythm Abdomen: Tender in the right upper abdomen.  Dressing intact, hypoactive bowel sounds GU: Concentrated yellow urine Neuro/psych: Complains of right upper quadrant pain, moves all extremities, more appropriate today, oriented x3, generalized weakness. Extremities/muscular skeletal: Warm/dry, brisk cap refill, no significant edema.  LABS:  BMET Recent Labs  Lab 09/13/17 1532 09/13/17 2200 09/14/17 0530  NA 147* 141 142  K 4.2 4.4 3.8  CL 124* 116* 112*  CO2 15* 10* 18*  BUN 44* 41* 42*  CREATININE 3.35* 3.60* 3.56*  GLUCOSE 125* 226* 218*    Electrolytes Recent Labs  Lab 09/13/17 1532 09/13/17 2200 09/14/17 0530  CALCIUM 7.4* 6.8* 7.0*  MG  --   --  1.5*  PHOS  --   --  4.7*    CBC Recent Labs  Lab 09/13/17 1532 09/14/17 0200 09/14/17 0530  WBC 10.6* 25.0* 25.5*  HGB 5.7* 8.6* 9.0*  HCT 16.3* 24.6* 25.8*  PLT 227 214 225    Coag's Recent Labs  Lab 09/10/2017 0455 09/13/17 0505 09/14/17 0530  INR 1.36 1.50 2.32    Sepsis Markers Recent Labs  Lab 09/13/17 1700 09/13/17 2000 09/14/17 0600  LATICACIDVEN 2.3* 8.0* 4.9*    ABG Recent Labs  Lab 09/13/17 1545 09/13/17 2230 09/14/17 0244  PHART 7.212* 7.136* 7.277*  PCO2ART 33.2 28.7* 30.5*  PO2ART 93.7  65.2* 83.7    Liver Enzymes Recent Labs  Lab 09/13/17 0505 09/13/17 1532 09/14/17 0530  AST 103* 109* 3,689*  ALT 86* 76* 1,645*  ALKPHOS 215* 149* 189*  BILITOT 15.0* 11.2* 14.7*  ALBUMIN 1.9* 1.5* 1.7*    Cardiac Enzymes Recent Labs  Lab 09/13/17 2220 09/14/17 0530  TROPONINI 0.05* 0.10*    Glucose Recent Labs  Lab 08/23/2017 1726 09/13/17 0033 09/13/17 0554 09/13/17 1423 09/13/17 2344 09/14/17 0550  GLUCAP 107* 115* 108* 106* 211* 206*    Imaging Ct Abdomen Pelvis Wo Contrast  Result Date: 09/13/2017 CLINICAL DATA:  S/p liver bx today. hgb dropped, blood pressure dropped. Right sided pain EXAM: CT ABDOMEN AND PELVIS WITHOUT CONTRAST TECHNIQUE: Multidetector CT imaging of the abdomen and pelvis was performed following the standard protocol without IV contrast. COMPARISON:  MRI of the liver, MRCP, 09/10/2017 FINDINGS: Lower chest: Status post right mastectomy. Small right minimal left pleural effusions with associated mild dependent lower lobe atelectasis. Heart mildly enlarged. Hepatobiliary: There is complex fluid surrounding the liver consistent with hemorrhage. Bubbles of air are seen anterior to the gallbladder, within the right lobe mass/lesion. Dense contrast is seen within the dependent portions of the dilated intrahepatic bile ducts with a small amount of contrast noted in a normal caliber common bile duct. Multiple dependent gallstones are noted. Pancreas: No pancreatic masses. There is mild peripancreatic stranding, new since prior MRI. Spleen: Normal in size.  No mass or focal lesion. Adrenals/Urinary Tract: No adrenal masses. Bilateral renal cortical thinning. No renal masses. No stones. No hydronephrosis. Ureters are normal course and in caliber. Bladder is decompressed with a Foley catheter. Stomach/Bowel: Stomach and small bowel unremarkable. Colon is normal in caliber. There are multiple left colon diverticula. No diverticulitis or other colonic inflammatory  process. Normal appendix visualized. Vascular/Lymphatic: No discrete enlarged lymph nodes. There is aortic atherosclerosis. No aneurysm. Reproductive: Uterus and bilateral adnexa are unremarkable. Other: Hemoperitoneum. Complex fluid with areas of higher attenuation surrounds the liver, most evident anteriorly and laterally, extending inferiorly along the right pericolic gutter and anterior pararenal space into the pelvis. Lower attenuation fluid is seen adjacent to the spleen and along the left pericolic gutter. Musculoskeletal: No fracture or acute finding. No osteoblastic or osteolytic lesions. IMPRESSION: 1. Hemoperitoneum, most evident adjacent to the liver, which is presumably the source of bleeding, subsequent to the liver biopsy performed today. This is new since the prior MRI. 2. Small amount of air is seen within the right lobe mass adjacent to the gallbladder, presumed secondary to the liver biopsy. 3. Hazy inflammatory type changes now noted along the pancreas which is likely secondary to the liver hemorrhage. Electronically Signed   By: Lajean Manes M.D.   On: 09/13/2017 17:36   Dg Chest Port 1 View  Result Date: 09/13/2017 CLINICAL DATA:  Central line placement. EXAM: PORTABLE CHEST 1 VIEW COMPARISON:  09/09/2017 FINDINGS: New left subclavian central venous line has its tip projecting just above the caval atrial junction. No pneumothorax. Cardiac silhouette is normal in  size. No mediastinal or hilar masses. Clear lungs. IMPRESSION: 1. New left subclavian central venous line is well positioned, tip projecting just above the caval atrial junction. 2. No pneumothorax.  No other change from the prior study. Electronically Signed   By: Lajean Manes M.D.   On: 09/13/2017 16:42   Ir Percutaneous Transhepatic Cholangiogram  Result Date: 09/14/2017 INDICATION: Large 9 cm mass of the right lobe of the liver causing biliary obstruction and invading the gallbladder. The patient presents for biopsy of the  mass as well as percutaneous cholangiogram and biliary drainage due to inability to cannulate the common bile duct by ERCP due to biliary stricture. EXAM: 1. ULTRASOUND CORE BIOPSY OF HEPATIC MASS 2. PERCUTANEOUS TRANSHEPATIC CHOLANGIOGRAM WITH ULTRASOUND GUIDANCE FOR BILIARY ACCESS MEDICATIONS: 3.375 g IV Zosyn; The antibiotic was administered within an appropriate time frame prior to the initiation of the procedure. ANESTHESIA/SEDATION: Moderate (conscious) sedation was employed during this procedure. A total of Versed 4.0 mg and Fentanyl 200 mcg was administered intravenously. Moderate Sedation Time: 84 minutes. The patient's level of consciousness and vital signs were monitored continuously by radiology nursing throughout the procedure under my direct supervision. FLUOROSCOPY TIME:  Fluoroscopy Time: 5 minutes and 18 seconds. 255 mGy. COMPLICATIONS: A few hours following the procedure, it became evident that there was likely a bleeding complication due to hypotension and drop in hemoglobin. CT confirmed the presence of a large subcapsular hemorrhage surrounding the liver. Subsequent emergent hepatic arteriography and embolization was performed to treat bleeding. SIR LEVEL C - Requires therapy, minor hospitalization (<48 hrs). PROCEDURE: Informed written consent was obtained from the patient after a thorough discussion of the procedural risks, benefits and alternatives. All questions were addressed. Maximal Sterile Barrier Technique was utilized including caps, mask, sterile gowns, sterile gloves, sterile drape, hand hygiene and skin antiseptic. Chlorhexidine was used for skin prep. Local anesthesia was provided with 1% lidocaine. A timeout was performed prior to the initiation of the procedure. Ultrasound was used to initially localize a right lobe hepatic mass. Under ultrasound guidance, a 17 gauge needle was advanced into the hepatic mass. A total of 3 separate coaxial 18 gauge core biopsy samples were  obtained of the mass and samples submitted in formalin. Following the biopsy, Gel-Foam pledgets were injected through the outer needle as the needle was retracted. Ultrasound was performed of the right lobe of the liver. Under ultrasound guidance, a 21 gauge needle was advanced into a right lobe bile duct. Contrast was injected through the needle and a cholangiogram performed with several fluoroscopic images obtained. A guidewire was then advanced through the needle under fluoroscopy. Additional right lobe biliary access was performed with the 21 gauge needle. Further contrast injection was performed. Guidewire advancement was again performed. Further attempts were also made to access bile ducts slightly more inferiorly in the right lobe. FINDINGS: Ultrasound demonstrates a lobulated, ill-defined hypoechoic mass within the inferior right lobe extending into the porta hepatis. Solid tissue was obtained from the hepatic mass. Bile ducts in the right lobe are dilated superior to the mass and biliary anatomy is distorted due to mass effect from the underlying hepatic mass. After access of a dilated bile duct, contrast injection shows diffuse dilatation of right lobe ducts. Guidewire advancement was difficult and only either extended for a short distance or in a retrograde fashion more peripherally in the liver. Central guidewire access could not be obtained. Due to difficulty in obtaining biliary access and guidewire advancement as well as length of the  procedure, decision was made to abort attempts at biliary drain placement today. Re-attempt will be made on a later date. IMPRESSION: 1. Ultrasound-guided core biopsy performed of a large right lobe hepatic mass. 2. Percutaneous transhepatic cholangiogram demonstrates diffusely dilated right lobe ducts superior to the mass. Despite ability to cannulate right lobe bile ducts, guidewire advancement was unsuccessful centrally and a biliary drainage catheter could not be  placed today. Re-attempt will be made on a later date via either right lobe or left lobe biliary access. 3. Several hours after the procedure, it became evident that there was a bleeding complication with subcapsular hemorrhage present adjacent to the liver. This did lead to an emergent arteriogram and embolization procedure which will be dictated separately. Electronically Signed   By: Aletta Edouard M.D.   On: 09/14/2017 09:23     STUDIES:  ERCP done on 09/13/17: not able to advance guidewire  MRCP report:Large 7 x 8 x 9 cm mass centered in the inferior right liver, incorporating the mid gallbladder and extending anteriorly and cranially up into the medial segment of the left liver. Mass obstructs the right and left biliary ducts at the level of the confluence and the gallbladder fundus is distended and stone filled. 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 Hepatoduodenal ligament lymphadenopathy versus direct tumor extension into the hepatoduodenal ligament. 1/29: CT abd/pelvis: 1. Hemoperitoneum, most evident adjacent to the liver, which is presumably the source of bleeding, subsequent to the liver biopsy performed today. This is new since the prior MRI. 2. Small amount of air is seen within the right lobe mass adjacent to the gallbladder, presumed secondary to the liver biopsy. 3. Hazy inflammatory type changes now noted along the pancreas which is likely secondary to the liver hemorrhage. AIR perc transhepatic cholangiogram : 1. Ultrasound-guided core biopsy performed of a large right lobe hepatic mass. 2. Percutaneous transhepatic cholangiogram demonstrates diffusely dilated right lobe ducts superior to the mass. Despite ability to cannulate right lobe bile ducts, guidewire advancement was unsuccessful centrally and a  biliary drainage catheter could not be placed today. Re-attempt will be made on a later date via either right lobe or left lobe biliary access. 3. Several hours after the procedure, it became evident that there was a bleeding complication with subcapsular hemorrhage present adjacent to the liver. This did lead to an emergent arteriogram and embolization procedure which will be dictated separately.   CULTURES: BC x 2 1/29>>>  ANTIBIOTICS: Zosyn 1/24>>>  SIGNIFICANT EVENTS:   LINES/TUBES: Left [place] CVL 1/29>>>>  DISCUSSION: This is a 77 year old female who presents with newly diagnosed large liver mass causing obstructive jaundice.  ERCP attempt: unSuccessful.  attempt at percutaneous drain placement on 1/29 unsuccessful, hepatic bx was successful but c/b hemoperitoneum requiring hepatic artery embolization and 2 units PRBC Plan for today -wean pressors -cont bicarb -rx pain (w/ lowest effective dose fent) -f/u serial chemistries -await GI and IR input re: timing of repeat attempt for perc drainage -f/u bx  ASSESSMENT / PLAN:  Hemorrhagic shock in setting of Hemoperitoneum following liver Bx S/p successful hepatic arteriogram w/ coil embolization to right hepatic artery (emergent 1/30) -suspect shock state c/b acidosis Plan Wean levophed for MAP > 65 Cont Bicarb for hydration Careful w/ narcs given hypotension (using low dose fent) F/u cbc  mild trop elevation in setting of hemorrhagic shock and interestingly no compensatory tachycardia.  Plan ECHO Cont to  trend CEs   Obstructive jaundice from large hepatic mass involving the GB; w/ elevated LFTs -->worse after hypotension reflecting element of shock liver?  -unsuccessful ERCP 1/28 w/ plan to place perc drain -1/29 unsuccessful perc drain  -1/29 Liver bx obtained.  Plan NPO Eventually needs perc biliary drain F/u liver biopsies  F/u am LFTs  Acute encephalopathy: in setting of shock; resolved.  Plan Supportive  care Limit narcotics   Acute on chronic renal failure: initially improved. Baseline scr ~ 1.6. Felt 2/2 hypotension, had improved, spiked up as high as 3.6 now improving  -Nephro following Plan MAP goal > 65 Cont bicarb Strict I&O Renal dosing meds  Hypomagnesemia Plan Replace   Metabolic acidosis primarily NAG: suspect mix of AKI and hyperchloremia Plan Cont bicarb supplementation Serial chemistries ordered Watch for hypokalemia w/ bicarb replacement    Acute blood loss anemia superimposed on chronic anemia  Plan Holding AC Repeat CBC in am  Transfuse for hgb < 7 scd  Hypothyroidism Plan Cont IV synthroid until can take POs  Hyperglycemia  Plan Cont cbg checks q 4 May add ssi later   DVT prophylaxis: scds SUP: PPI  Diet: NPO Activity: BR Disposition : ICU   FAMILY  - Updates:   - Inter-disciplinary family meet or Palliative Care meeting due by: 2/5 45 min ccm time   Erick Colace ACNP-BC [place] Pager # [phone] OR # [phone] if no answer  09/14/2017, 9:28 AM  f

## 2017-09-14 NOTE — Progress Notes (Signed)
[facility] Progress Note Patient Name: Chelsey Savage DOB: 23-Nov-1940 MRN: [id number]   Date of Service  09/14/2017  HPI/Events of Note  Troponin #1 < 0.03 and #2 = 0.05 - Likely demand ischemia.   eICU Interventions  Continue to trend Troponin.      Intervention Category Intermediate Interventions: Diagnostic test evaluation  Sommer,Steven Cornelia Copa 09/14/2017, 12:59 AM

## 2017-09-14 NOTE — Care Management Note (Signed)
Case Management Note  Patient Details  Name: Chelsey Savage MRN: [id number] Date of Birth: 11-22-1940  Subjective/Objective:                  Hypotension, weakness, requiring iv levophed for pressure support.  Action/Plan: Date: September 14, 2017 Velva Harman, BSN, [place], [place] Chart and notes review for patient progress and needs. Will follow for case management and discharge needs. No cm or discharge needs present at time of this review. Next review date: [id number]  Expected Discharge Date:  (unknown)               Expected Discharge Plan:  Home/Self Care  In-House Referral:     Discharge planning Services  CM Consult  Post Acute Care Choice:    Choice offered to:     DME Arranged:    DME Agency:     HH Arranged:    HH Agency:     Status of Service:  In process, will continue to follow  If discussed at Long Length of Stay Meetings, dates discussed:    Additional Comments:  Leeroy Cha, RN 09/14/2017, 8:38 AM

## 2017-09-14 NOTE — Progress Notes (Signed)
Chief Complaint: Patient was seen today for follow up hepatic arteriogram for bleed   Supervising Physician: Daryll Brod  Patient Status: [facility] - In-pt  Subjective: Pt to IR last pm for subcapsular hemorrhage of the liver after biopsy and attempted PTC. 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 Pt in ICU. Reports some RUQ pain. No N/V No bleeding/issues from right groin per RN Remains on some Levophed  Objective: Physical Exam: BP (!) 104/54   Pulse 87   Temp 98.1 F (36.7 C) (Oral)   Resp (!) 21   Ht 5' 5" (1.651 m)   Wt 245 lb 6 oz (111.3 kg)   SpO2 96%   BMI 40.83 kg/m  Awake, alert Abd: soft, ND, mildly tender RUQ. No distention Ext: (R)groin soft, no palpable hematoma   Current Facility-Administered Medications:  .  Chlorhexidine Gluconate Cloth 2 % PADS 6 each, 6 each, Topical, Daily, Ramaswamy, Murali, MD .  levothyroxine (SYNTHROID, LEVOTHROID) injection 50 mcg, 50 mcg, Intravenous, Daily, Erick Colace, NP .  MEDLINE mouth rinse, 15 mL, Mouth Rinse, BID, Kary Kos, Peter E, NP .  norepinephrine (LEVOPHED) 16 mg in dextrose 5 % 250 mL (0.064 mg/mL) infusion, 0-60 mcg/min, Intravenous, Titrated, Ramaswamy, Murali, MD, Last Rate: 41.3 mL/hr at 09/14/17 0700, 44 mcg/min at 09/14/17 0700 .  [DISCONTINUED] ondansetron (ZOFRAN) tablet 4 mg, 4 mg, Oral, Q6H PRN **OR** ondansetron (ZOFRAN) injection 4 mg, 4 mg, Intravenous, Q6H PRN, [name], MD .  pantoprazole (PROTONIX) injection 40 mg, 40 mg, Intravenous, Q12H, [name], MD, 40 mg at 09/13/17 2128 .  piperacillin-tazobactam (ZOSYN) IVPB 2.25 g, 2.25 g, Intravenous, Q6H, Marene Lenz, MD, Stopped at 09/14/17 0558 .  sodium bicarbonate 150 mEq in dextrose 5 % 1,000 mL infusion, , Intravenous, Continuous, Ramaswamy, Murali, MD .  sodium chloride flush (NS) 0.9 % injection 10-40 mL, 10-40 mL, Intracatheter, Q12H, Ramaswamy, Murali,  MD .  sodium chloride flush (NS) 0.9 % injection 10-40 mL, 10-40 mL, Intracatheter, PRN, Brand Males, MD  Labs: CBC Recent Labs    09/14/17 0200 09/14/17 0530  WBC 25.0* 25.5*  HGB 8.6* 9.0*  HCT 24.6* 25.8*  PLT 214 225   BMET Recent Labs    09/13/17 2200 09/14/17 0530  NA 141 142  K 4.4 3.8  CL 116* 112*  CO2 10* 18*  GLUCOSE 226* 218*  BUN 41* 42*  CREATININE 3.60* 3.56*  CALCIUM 6.8* 7.0*   LFT Recent Labs    09/14/17 0530  PROT 4.6*  ALBUMIN 1.7*  AST 3,689*  ALT 1,645*  ALKPHOS 189*  BILITOT 14.7*   PT/INR Recent Labs    09/13/17 0505 09/14/17 0530  LABPROT 18.0* 25.3*  INR 1.50 2.32     Studies/Results: Ct Abdomen Pelvis Wo Contrast  Result Date: 09/13/2017 CLINICAL DATA:  S/p liver bx today. hgb dropped, blood pressure dropped. Right sided pain EXAM: CT ABDOMEN AND PELVIS WITHOUT CONTRAST TECHNIQUE: Multidetector CT imaging of the abdomen and pelvis was performed following the standard protocol without IV contrast. COMPARISON:  MRI of the liver, MRCP, 09/10/2017 FINDINGS: Lower chest: Status post right mastectomy. Small right minimal left pleural effusions with associated mild dependent lower lobe atelectasis. Heart mildly enlarged. Hepatobiliary: There is complex fluid surrounding the liver consistent with hemorrhage. Bubbles of air are seen anterior to the gallbladder, within the right lobe mass/lesion. Dense contrast is seen within the dependent portions of the dilated intrahepatic bile ducts with  a small amount of contrast noted in a normal caliber common bile duct. Multiple dependent gallstones are noted. Pancreas: No pancreatic masses. There is mild peripancreatic stranding, new since prior MRI. Spleen: Normal in size.  No mass or focal lesion. Adrenals/Urinary Tract: No adrenal masses. Bilateral renal cortical thinning. No renal masses. No stones. No hydronephrosis. Ureters are normal course and in caliber. Bladder is decompressed with a Foley  catheter. Stomach/Bowel: Stomach and small bowel unremarkable. Colon is normal in caliber. There are multiple left colon diverticula. No diverticulitis or other colonic inflammatory process. Normal appendix visualized. Vascular/Lymphatic: No discrete enlarged lymph nodes. There is aortic atherosclerosis. No aneurysm. Reproductive: Uterus and bilateral adnexa are unremarkable. Other: Hemoperitoneum. Complex fluid with areas of higher attenuation surrounds the liver, most evident anteriorly and laterally, extending inferiorly along the right pericolic gutter and anterior pararenal space into the pelvis. Lower attenuation fluid is seen adjacent to the spleen and along the left pericolic gutter. Musculoskeletal: No fracture or acute finding. No osteoblastic or osteolytic lesions. IMPRESSION: 1. Hemoperitoneum, most evident adjacent to the liver, which is presumably the source of bleeding, subsequent to the liver biopsy performed today. This is new since the prior MRI. 2. Small amount of air is seen within the right lobe mass adjacent to the gallbladder, presumed secondary to the liver biopsy. 3. Hazy inflammatory type changes now noted along the pancreas which is likely secondary to the liver hemorrhage. Electronically Signed   By: Lajean Manes M.D.   On: 09/13/2017 17:36   Ir US Guide Bx Asp/drain  Result Date: 09/14/2017 INDICATION: Large 9 cm mass of the right lobe of the liver causing biliary obstruction and invading the gallbladder. The patient presents for biopsy of the mass as well as percutaneous cholangiogram and biliary drainage due to inability to cannulate the common bile duct by ERCP due to biliary stricture. EXAM: 1. ULTRASOUND CORE BIOPSY OF HEPATIC MASS 2. PERCUTANEOUS TRANSHEPATIC CHOLANGIOGRAM WITH ULTRASOUND GUIDANCE FOR BILIARY ACCESS MEDICATIONS: 3.375 g IV Zosyn; The antibiotic was administered within an appropriate time frame prior to the initiation of the procedure. ANESTHESIA/SEDATION:  Moderate (conscious) sedation was employed during this procedure. A total of Versed 4.0 mg and Fentanyl 200 mcg was administered intravenously. Moderate Sedation Time: 84 minutes. The patient's level of consciousness and vital signs were monitored continuously by radiology nursing throughout the procedure under my direct supervision. FLUOROSCOPY TIME:  Fluoroscopy Time: 5 minutes and 18 seconds. 255 mGy. COMPLICATIONS: A few hours following the procedure, it became evident that there was likely a bleeding complication due to hypotension and drop in hemoglobin. CT confirmed the presence of a large subcapsular hemorrhage surrounding the liver. Subsequent emergent hepatic arteriography and embolization was performed to treat bleeding. SIR LEVEL C - Requires therapy, minor hospitalization (<48 hrs). PROCEDURE: Informed written consent was obtained from the patient after a thorough discussion of the procedural risks, benefits and alternatives. All questions were addressed. Maximal Sterile Barrier Technique was utilized including caps, mask, sterile gowns, sterile gloves, sterile drape, hand hygiene and skin antiseptic. Chlorhexidine was used for skin prep. Local anesthesia was provided with 1% lidocaine. A timeout was performed prior to the initiation of the procedure. Ultrasound was used to initially localize a right lobe hepatic mass. Under ultrasound guidance, a 17 gauge needle was advanced into the hepatic mass. A total of 3 separate coaxial 18 gauge core biopsy samples were obtained of the mass and samples submitted in formalin. Following the biopsy, Gel-Foam pledgets were injected through the outer  needle as the needle was retracted. Ultrasound was performed of the right lobe of the liver. Under ultrasound guidance, a 21 gauge needle was advanced into a right lobe bile duct. Contrast was injected through the needle and a cholangiogram performed with several fluoroscopic images obtained. A guidewire was then  advanced through the needle under fluoroscopy. Additional right lobe biliary access was performed with the 21 gauge needle. Further contrast injection was performed. Guidewire advancement was again performed. Further attempts were also made to access bile ducts slightly more inferiorly in the right lobe. FINDINGS: Ultrasound demonstrates a lobulated, ill-defined hypoechoic mass within the inferior right lobe extending into the porta hepatis. Solid tissue was obtained from the hepatic mass. Bile ducts in the right lobe are dilated superior to the mass and biliary anatomy is distorted due to mass effect from the underlying hepatic mass. After access of a dilated bile duct, contrast injection shows diffuse dilatation of right lobe ducts. Guidewire advancement was difficult and only either extended for a short distance or in a retrograde fashion more peripherally in the liver. Central guidewire access could not be obtained. Due to difficulty in obtaining biliary access and guidewire advancement as well as length of the procedure, decision was made to abort attempts at biliary drain placement today. Re-attempt will be made on a later date. IMPRESSION: 1. Ultrasound-guided core biopsy performed of a large right lobe hepatic mass. 2. Percutaneous transhepatic cholangiogram demonstrates diffusely dilated right lobe ducts superior to the mass. Despite ability to cannulate right lobe bile ducts, guidewire advancement was unsuccessful centrally and a biliary drainage catheter could not be placed today. Re-attempt will be made on a later date via either right lobe or left lobe biliary access. 3. Several hours after the procedure, it became evident that there was a bleeding complication with subcapsular hemorrhage present adjacent to the liver. This did lead to an emergent arteriogram and embolization procedure which will be dictated separately. Electronically Signed   By: Aletta Edouard M.D.   On: 09/14/2017 09:23   Dg Chest  Port 1 View  Result Date: 09/13/2017 CLINICAL DATA:  Central line placement. EXAM: PORTABLE CHEST 1 VIEW COMPARISON:  09/09/2017 FINDINGS: New left subclavian central venous line has its tip projecting just above the caval atrial junction. No pneumothorax. Cardiac silhouette is normal in size. No mediastinal or hilar masses. Clear lungs. IMPRESSION: 1. New left subclavian central venous line is well positioned, tip projecting just above the caval atrial junction. 2. No pneumothorax.  No other change from the prior study. Electronically Signed   By: Lajean Manes M.D.   On: 09/13/2017 16:42   Dg C-arm 1-60 Min-no Report  Result Date: 08/31/2017 Fluoroscopy was utilized by the requesting physician.  No radiographic interpretation.   Ir Percutaneous Transhepatic Cholangiogram  Result Date: 09/14/2017 INDICATION: Large 9 cm mass of the right lobe of the liver causing biliary obstruction and invading the gallbladder. The patient presents for biopsy of the mass as well as percutaneous cholangiogram and biliary drainage due to inability to cannulate the common bile duct by ERCP due to biliary stricture. EXAM: 1. ULTRASOUND CORE BIOPSY OF HEPATIC MASS 2. PERCUTANEOUS TRANSHEPATIC CHOLANGIOGRAM WITH ULTRASOUND GUIDANCE FOR BILIARY ACCESS MEDICATIONS: 3.375 g IV Zosyn; The antibiotic was administered within an appropriate time frame prior to the initiation of the procedure. ANESTHESIA/SEDATION: Moderate (conscious) sedation was employed during this procedure. A total of Versed 4.0 mg and Fentanyl 200 mcg was administered intravenously. Moderate Sedation Time: 84 minutes. The patient's level  of consciousness and vital signs were monitored continuously by radiology nursing throughout the procedure under my direct supervision. FLUOROSCOPY TIME:  Fluoroscopy Time: 5 minutes and 18 seconds. 255 mGy. COMPLICATIONS: A few hours following the procedure, it became evident that there was likely a bleeding complication due to  hypotension and drop in hemoglobin. CT confirmed the presence of a large subcapsular hemorrhage surrounding the liver. Subsequent emergent hepatic arteriography and embolization was performed to treat bleeding. SIR LEVEL C - Requires therapy, minor hospitalization (<48 hrs). PROCEDURE: Informed written consent was obtained from the patient after a thorough discussion of the procedural risks, benefits and alternatives. All questions were addressed. Maximal Sterile Barrier Technique was utilized including caps, mask, sterile gowns, sterile gloves, sterile drape, hand hygiene and skin antiseptic. Chlorhexidine was used for skin prep. Local anesthesia was provided with 1% lidocaine. A timeout was performed prior to the initiation of the procedure. Ultrasound was used to initially localize a right lobe hepatic mass. Under ultrasound guidance, a 17 gauge needle was advanced into the hepatic mass. A total of 3 separate coaxial 18 gauge core biopsy samples were obtained of the mass and samples submitted in formalin. Following the biopsy, Gel-Foam pledgets were injected through the outer needle as the needle was retracted. Ultrasound was performed of the right lobe of the liver. Under ultrasound guidance, a 21 gauge needle was advanced into a right lobe bile duct. Contrast was injected through the needle and a cholangiogram performed with several fluoroscopic images obtained. A guidewire was then advanced through the needle under fluoroscopy. Additional right lobe biliary access was performed with the 21 gauge needle. Further contrast injection was performed. Guidewire advancement was again performed. Further attempts were also made to access bile ducts slightly more inferiorly in the right lobe. FINDINGS: Ultrasound demonstrates a lobulated, ill-defined hypoechoic mass within the inferior right lobe extending into the porta hepatis. Solid tissue was obtained from the hepatic mass. Bile ducts in the right lobe are dilated  superior to the mass and biliary anatomy is distorted due to mass effect from the underlying hepatic mass. After access of a dilated bile duct, contrast injection shows diffuse dilatation of right lobe ducts. Guidewire advancement was difficult and only either extended for a short distance or in a retrograde fashion more peripherally in the liver. Central guidewire access could not be obtained. Due to difficulty in obtaining biliary access and guidewire advancement as well as length of the procedure, decision was made to abort attempts at biliary drain placement today. Re-attempt will be made on a later date. IMPRESSION: 1. Ultrasound-guided core biopsy performed of a large right lobe hepatic mass. 2. Percutaneous transhepatic cholangiogram demonstrates diffusely dilated right lobe ducts superior to the mass. Despite ability to cannulate right lobe bile ducts, guidewire advancement was unsuccessful centrally and a biliary drainage catheter could not be placed today. Re-attempt will be made on a later date via either right lobe or left lobe biliary access. 3. Several hours after the procedure, it became evident that there was a bleeding complication with subcapsular hemorrhage present adjacent to the liver. This did lead to an emergent arteriogram and embolization procedure which will be dictated separately. Electronically Signed   By: Aletta Edouard M.D.   On: 09/14/2017 09:23    Assessment/Plan: Acute hepatic hematoma s/p liver core bx S/p MICRO COIL EMBO OF 2 RT HEPATIC ARTERY PERIPHERAL BRANCH PSAs Post procedure shock liver with marked transaminitis and coagulopathy, anticipate this will correct Hgb up to 9.0 BP stable,  trying to wean off Neo IR following closely     LOS: 5 days   I spent a total of 15 minutes in face to face in clinical consultation, greater than 50% of which was counseling/coordinating care for Acute hepatic hematoma s/p liver core bx  S/p MICRO COIL EMBO OF 2 RT HEPATIC  ARTERY PERIPHERAL BRANCH PSAs    Ascencion Dike PA-C 09/14/2017 9:29 AM

## 2017-09-15 ENCOUNTER — Inpatient Hospital Stay (HOSPITAL_COMMUNITY): Payer: Medicare Other

## 2017-09-15 LAB — CBC WITH DIFFERENTIAL/PLATELET
BASOS ABS: 0 10*3/uL (ref 0.0–0.1)
BASOS PCT: 0 %
Basophils Absolute: 0 10*3/uL (ref 0.0–0.1)
Basophils Relative: 0 %
EOS PCT: 0 %
Eosinophils Absolute: 0 10*3/uL (ref 0.0–0.7)
Eosinophils Absolute: 0 10*3/uL (ref 0.0–0.7)
Eosinophils Relative: 0 %
HEMATOCRIT: 21.7 % — AB (ref 36.0–46.0)
HEMATOCRIT: 22.9 % — AB (ref 36.0–46.0)
HEMOGLOBIN: 8.1 g/dL — AB (ref 12.0–15.0)
Hemoglobin: 7.9 g/dL — ABNORMAL LOW (ref 12.0–15.0)
LYMPHS ABS: 0.9 10*3/uL (ref 0.7–4.0)
LYMPHS ABS: 0.7 10*3/uL (ref 0.7–4.0)
LYMPHS PCT: 3 %
Lymphocytes Relative: 3 %
MCH: 28.7 pg (ref 26.0–34.0)
MCH: 28.2 pg (ref 26.0–34.0)
MCHC: 36.4 g/dL — AB (ref 30.0–36.0)
MCHC: 35.4 g/dL (ref 30.0–36.0)
MCV: 78.9 fL (ref 78.0–100.0)
MCV: 79.8 fL (ref 78.0–100.0)
MONO ABS: 1.7 10*3/uL — AB (ref 0.1–1.0)
MONO ABS: 1.5 10*3/uL (ref 0.1–1.0)
MONOS PCT: 6 %
Monocytes Relative: 6 %
NEUTROS ABS: 25.8 10*3/uL — AB (ref 1.7–7.7)
NEUTROS PCT: 91 %
Neutro Abs: 25.1 10*3/uL (ref 1.7–7.7)
Neutrophils Relative %: 91 %
Platelets: 156 10*3/uL (ref 150–400)
Platelets: 169 10*3/uL (ref 150–400)
RBC: 2.75 MIL/uL — AB (ref 3.87–5.11)
RBC: 2.87 MIL/uL — AB (ref 3.87–5.11)
RDW: 17.9 % — AB (ref 11.5–15.5)
RDW: 18.2 % — ABNORMAL HIGH (ref 11.5–15.5)
WBC: 28.4 10*3/uL — AB (ref 4.0–10.5)
WBC: 27.4 10*3/uL — AB (ref 4.0–10.5)
nRBC: 6 /100 WBC — ABNORMAL HIGH

## 2017-09-15 LAB — GLUCOSE, CAPILLARY
GLUCOSE-CAPILLARY: 95 mg/dL (ref 65–99)
Glucose-Capillary: 104 mg/dL — ABNORMAL HIGH (ref 65–99)
Glucose-Capillary: 83 mg/dL (ref 65–99)
Glucose-Capillary: 87 mg/dL (ref 65–99)

## 2017-09-15 LAB — BLOOD GAS, ARTERIAL
ACID-BASE DEFICIT: 4.1 mmol/L — AB (ref 0.0–2.0)
BICARBONATE: 21.7 mmol/L (ref 20.0–28.0)
Drawn by: 331471
O2 CONTENT: 6 L/min
O2 Saturation: 90.3 %
PCO2 ART: 46.3 mmHg (ref 32.0–48.0)
PH ART: 7.292 — AB (ref 7.350–7.450)
Patient temperature: 98.6
pO2, Arterial: 65 mmHg — ABNORMAL LOW (ref 83.0–108.0)

## 2017-09-15 LAB — BASIC METABOLIC PANEL
Anion gap: 14 (ref 5–15)
BUN: 51 mg/dL — AB (ref 6–20)
CALCIUM: 6.6 mg/dL — AB (ref 8.9–10.3)
CO2: 24 mmol/L (ref 22–32)
CREATININE: 5 mg/dL — AB (ref 0.44–1.00)
Chloride: 104 mmol/L (ref 101–111)
GFR calc non Af Amer: 8 mL/min — ABNORMAL LOW (ref >60)
GFR, EST AFRICAN AMERICAN: 9 mL/min — AB (ref >60)
GLUCOSE: 104 mg/dL — AB (ref 65–99)
Potassium: 4.1 mmol/L (ref 3.5–5.1)
Sodium: 142 mmol/L (ref 135–145)

## 2017-09-15 LAB — RENAL FUNCTION PANEL
ANION GAP: 18 — AB (ref 5–15)
Albumin: 1.9 g/dL — ABNORMAL LOW (ref 3.5–5.0)
BUN: 48 mg/dL — ABNORMAL HIGH (ref 6–20)
CO2: 22 mmol/L (ref 22–32)
Calcium: 6.5 mg/dL — ABNORMAL LOW (ref 8.9–10.3)
Chloride: 103 mmol/L (ref 101–111)
Creatinine, Ser: 4.87 mg/dL — ABNORMAL HIGH (ref 0.44–1.00)
GFR calc non Af Amer: 8 mL/min — ABNORMAL LOW (ref >60)
GFR, EST AFRICAN AMERICAN: 9 mL/min — AB (ref >60)
Glucose, Bld: 106 mg/dL — ABNORMAL HIGH (ref 65–99)
Phosphorus: 6.6 mg/dL — ABNORMAL HIGH (ref 2.5–4.6)
Potassium: 4.2 mmol/L (ref 3.5–5.1)
Sodium: 143 mmol/L (ref 135–145)

## 2017-09-15 LAB — COMPREHENSIVE METABOLIC PANEL
ALK PHOS: 320 U/L — AB (ref 38–126)
ALT: 2228 U/L — AB (ref 14–54)
AST: 5328 U/L — AB (ref 15–41)
Albumin: 1.8 g/dL — ABNORMAL LOW (ref 3.5–5.0)
Anion gap: 12 (ref 5–15)
BUN: 49 mg/dL — AB (ref 6–20)
CO2: 24 mmol/L (ref 22–32)
CREATININE: 4.54 mg/dL — AB (ref 0.44–1.00)
Calcium: 6.6 mg/dL — ABNORMAL LOW (ref 8.9–10.3)
Chloride: 106 mmol/L (ref 101–111)
GFR calc non Af Amer: 9 mL/min — ABNORMAL LOW (ref >60)
GFR, EST AFRICAN AMERICAN: 10 mL/min — AB (ref >60)
GLUCOSE: 113 mg/dL — AB (ref 65–99)
Potassium: 3.8 mmol/L (ref 3.5–5.1)
SODIUM: 142 mmol/L (ref 135–145)
Total Bilirubin: 16.5 mg/dL — ABNORMAL HIGH (ref 0.3–1.2)
Total Protein: 4.5 g/dL — ABNORMAL LOW (ref 6.5–8.1)

## 2017-09-15 LAB — CBC
HEMATOCRIT: 21.5 % — AB (ref 36.0–46.0)
Hemoglobin: 7.8 g/dL — ABNORMAL LOW (ref 12.0–15.0)
MCH: 28.5 pg (ref 26.0–34.0)
MCHC: 36.3 g/dL — AB (ref 30.0–36.0)
MCV: 78.5 fL (ref 78.0–100.0)
Platelets: 153 10*3/uL (ref 150–400)
RBC: 2.74 MIL/uL — ABNORMAL LOW (ref 3.87–5.11)
RDW: 17.5 % — ABNORMAL HIGH (ref 11.5–15.5)
WBC: 27.1 10*3/uL — ABNORMAL HIGH (ref 4.0–10.5)

## 2017-09-15 LAB — PHOSPHORUS: PHOSPHORUS: 6 mg/dL — AB (ref 2.5–4.6)

## 2017-09-15 LAB — PROTIME-INR
INR: 2.74
Prothrombin Time: 28.8 seconds — ABNORMAL HIGH (ref 11.4–15.2)

## 2017-09-15 LAB — LACTIC ACID, PLASMA
Lactic Acid, Venous: 4.2 mmol/L (ref 0.5–1.9)
Lactic Acid, Venous: 9.1 mmol/L (ref 0.5–1.9)

## 2017-09-15 LAB — MRSA PCR SCREENING: MRSA by PCR: NEGATIVE

## 2017-09-15 LAB — MAGNESIUM: Magnesium: 2 mg/dL (ref 1.7–2.4)

## 2017-09-15 MED ORDER — PRISMASOL BGK 4/2.5 32-4-2.5 MEQ/L IV SOLN
INTRAVENOUS | Status: DC
  Administered 2017-09-15 – 2017-09-16 (×3): via INTRAVENOUS_CENTRAL
  Filled 2017-09-15 (×4): qty 5000

## 2017-09-15 MED ORDER — PRISMASOL BGK 4/2.5 32-4-2.5 MEQ/L IV SOLN
INTRAVENOUS | Status: DC
  Administered 2017-09-15 – 2017-09-16 (×4): via INTRAVENOUS_CENTRAL
  Filled 2017-09-15 (×6): qty 5000

## 2017-09-15 MED ORDER — HEPARIN (PORCINE) 2000 UNITS/L FOR CRRT
INTRAVENOUS_CENTRAL | Status: DC | PRN
  Administered 2017-09-15: 17:00:00 via INTRAVENOUS_CENTRAL
  Filled 2017-09-15: qty 1000

## 2017-09-15 MED ORDER — HEPARIN SODIUM (PORCINE) 1000 UNIT/ML IJ SOLN
1000.0000 [IU] | INTRAMUSCULAR | Status: DC | PRN
  Administered 2017-09-15: 1000 [IU]
  Administered 2017-09-16: 1200 [IU]
  Filled 2017-09-15: qty 6

## 2017-09-15 MED ORDER — VANCOMYCIN HCL 10 G IV SOLR
1500.0000 mg | Freq: Once | INTRAVENOUS | Status: AC
  Administered 2017-09-15: 1500 mg via INTRAVENOUS
  Filled 2017-09-15: qty 1500

## 2017-09-15 MED ORDER — VANCOMYCIN HCL IN DEXTROSE 1-5 GM/200ML-% IV SOLN
1000.0000 mg | INTRAVENOUS | Status: DC
  Administered 2017-09-16: 1000 mg via INTRAVENOUS
  Filled 2017-09-15: qty 200

## 2017-09-15 MED ORDER — PRISMASOL BGK 4/2.5 32-4-2.5 MEQ/L IV SOLN
INTRAVENOUS | Status: DC
  Administered 2017-09-15 – 2017-09-16 (×7): via INTRAVENOUS_CENTRAL
  Filled 2017-09-15 (×11): qty 5000

## 2017-09-15 MED ORDER — SODIUM CHLORIDE 0.9 % IV SOLN
1.0000 g | Freq: Two times a day (BID) | INTRAVENOUS | Status: DC
  Administered 2017-09-15 – 2017-09-16 (×4): 1 g via INTRAVENOUS
  Filled 2017-09-15 (×5): qty 1

## 2017-09-15 NOTE — Progress Notes (Signed)
Walk rounds 6:03 PM  Looks better after starting CRRT But levophed needs higher and lactate up bu tno bleeding Has shock liver  Plan Repeat cbc and lactate at 9pm   Dr. Brand Males, M.D., [facility].C.P Pulmonary and Critical Care Medicine Staff Physician, [place] Director - Interstitial Lung Disease  Program  [facility] [place] [facility] [place], [place], [address]  Pager: [phone], If no answer or between  15:00h - 7:00h: call [phone] Telephone: [phone]     Recent Labs  Lab 09/14/17 0600 09/14/17 1345 09/15/17 1102  LATICACIDVEN 4.9* 3.9* 4.2*   Recent Labs  Lab 09/14/17 1344 09/15/17 0500 09/15/17 1101  HGB 7.7* 7.8* 7.9*  HCT 21.6* 21.5* 21.7*  WBC 22.5* 27.1* 28.4*  PLT 171 153 156   Recent Labs  Lab 09/13/17 2200 09/14/17 0530 09/14/17 1344 09/15/17 0500 09/15/17 1101  NA 141 142 144 142 142  K 4.4 3.8 3.6 3.8 4.1  CL 116* 112* 111 106 104  CO2 10* 18* 21* 24 24  GLUCOSE 226* 218* 140* 113* 104*  BUN 41* 42* 45* 49* 51*  CREATININE 3.60* 3.56* 3.97* 4.54* 5.00*  CALCIUM 6.8* 7.0* 6.9* 6.6* 6.6*  MG  --  1.5*  --  2.0  --   PHOS  --  4.7*  --  6.0*  --    Recent Labs  Lab 08/26/2017 0455 09/13/17 0505 09/13/17 1532 09/14/17 0530 09/15/17 0500  AST 112* 103* 109* 3,689* 5,328*  ALT 92* 86* 76* 1,645* 2,228*  ALKPHOS 205* 215* 149* 189* 320*  BILITOT 15.3* 15.0* 11.2* 14.7* 16.5*  PROT 5.9* 5.7* 4.1* 4.6* 4.5*  ALBUMIN 2.0* 1.9* 1.5* 1.7* 1.8*  INR 1.36 1.50  --  2.32 2.74

## 2017-09-15 NOTE — Progress Notes (Signed)
PULMONARY / CRITICAL CARE MEDICINE   Name: Chelsey Savage MRN: [id number] DOB: 1941/03/21    ADMISSION DATE:  08/29/2017 CONSULTATION DATE: 1/29  REFERRING MD:  Tawanna Solo   CHIEF COMPLAINT:  Shock   HISTORY OF PRESENT ILLNESS:   77 year old female with no history of chronic kidney disease, hypertension, hypothyroidism, remote history of breast cancer.  Recently treated for a liver abscess.  Presented on 1/24 with chief complaint of weakness and decreased appetite.  In ER found to have acute kidney failure with creatinine rising from baseline of 1.6 up to 3, and increased liver function testing.  She had elevated total bili with jaundice on arrival.  Diagnostic evaluation demonstrated hepatic mass on MRI with intrahepatic biliary obstruction, the mass involve the right lobe of the liver, gallbladder and into the left lobe.  She had markedly dilated intrahepatic ducts as well as cholelithiasis.  He was seen in consultation by gastroenterology.  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  She underwent ERCP on 1/28 where they found a tight biliary stricture with distal edge in the distal common bile duct they were unable to advance wire past the stricture.  Because of this interventional radiology was consulted for percutaneous biliary drainage as well as biopsy of mass.  On 1/29 she will underwent ultrasound-guided liver biopsy, as well as percutaneous transhepatic cholangiogram, unfortunately attempt at percutaneous drainage had to be aborted due to agitation.  The plan was to reevaluate this again on 1/30.  On return from interventional radiology she was hypotensive, encephalopathic, her blood pressure was as low as the 40J systolic.  Fluid challenge was initiated and pulmonary critical care asked to evaluate.   SUBJECTIVE:  Still with marked pain, renal function worse, now orthopneic  VITAL SIGNS: BP (!) 88/15   Pulse 79   Temp 97.6 F (36.4 C)  (Oral)   Resp (!) 21   Ht 5\' 5"  (1.651 m)   Wt 257 lb 4.4 oz (116.7 kg)   SpO2 90%   BMI 42.81 kg/m   HEMODYNAMICS: CVP:  [5 mmHg-11 mmHg] 6 mmHg  VENTILATOR SETTINGS:    INTAKE / OUTPUT:  Intake/Output Summary (Last 24 hours) at 09/15/2017 0827 Last data filed at 09/15/2017 0600 Gross per 24 hour  Intake 3889.42 ml  Output 85 ml  Net 3804.42 ml     PHYSICAL EXAMINATION: General: This is a 77 year old female lying in bed.  She is anxious today, complains of right upper and right lower quadrant abdominal discomfort, seemingly on par with complaints from yesterday HEENT: Normocephalic atraumatic does have icteric sclera her mucous membranes are moist she does have some jugular venous distention today Pulmonary: Some increased labored respiratory effort especially when in the recumbent position.  Some basilar rales appreciated this a.m. Cardiac: Regular rhythm, no murmur rub or gallop sinus rhythm on telemetry Abdomen: Tender to palpation particularly right upper and lower quadrant hypoactive bowel sounds dressings intact GU: Oliguric Musculoskeletal/extremities: Warm, dry, trace edema, strong pulses Neuro/psych: Awake, anxious, complains of pain with any movement moves all extremities, oriented x3  LABS:  BMET Recent Labs  Lab 09/14/17 0530 09/14/17 1344 09/15/17 0500  NA 142 144 142  K 3.8 3.6 3.8  CL 112* 111 106  CO2 18* 21* 24  BUN 42* 45* 49*  CREATININE 3.56* 3.97* 4.54*  GLUCOSE 218* 140* 113*    Electrolytes Recent Labs  Lab 09/14/17 0530 09/14/17 1344 09/15/17 0500  CALCIUM 7.0* 6.9* 6.6*  MG 1.5*  --  2.0  PHOS 4.7*  --  6.0*    CBC Recent Labs  Lab 09/14/17 0530 09/14/17 1344 09/15/17 0500  WBC 25.5* 22.5* 27.1*  HGB 9.0* 7.7* 7.8*  HCT 25.8* 21.6* 21.5*  PLT 225 171 153    Coag's Recent Labs  Lab 09/13/17 0505 09/14/17 0530 09/15/17 0500  INR 1.50 2.32 2.74    Sepsis Markers Recent Labs  Lab 09/13/17 2000 09/14/17 0600  09/14/17 1345  LATICACIDVEN 8.0* 4.9* 3.9*    ABG Recent Labs  Lab 09/13/17 1545 09/13/17 2230 09/14/17 0244  PHART 7.212* 7.136* 7.277*  PCO2ART 33.2 28.7* 30.5*  PO2ART 93.7 65.2* 83.7    Liver Enzymes Recent Labs  Lab 09/13/17 1532 09/14/17 0530 09/15/17 0500  AST 109* 3,689* 5,328*  ALT 76* 1,645* 2,228*  ALKPHOS 149* 189* 320*  BILITOT 11.2* 14.7* 16.5*  ALBUMIN 1.5* 1.7* 1.8*    Cardiac Enzymes Recent Labs  Lab 09/13/17 2220 09/14/17 0530  TROPONINI 0.05* 0.10*    Glucose Recent Labs  Lab 09/13/17 1423 09/13/17 2344 09/14/17 0550 09/14/17 1708 09/14/17 2334 09/15/17 0620  GLUCAP 106* 211* 206* 126* 110* 104*    Imaging No results found.   STUDIES:  ERCP done on 09/13/17: not able to advance guidewire  MRCP report:Large 7 x 8 x 9 cm mass centered in the inferior right liver, incorporating the mid gallbladder and extending anteriorly and cranially up into the medial segment of the left liver. Mass obstructs the right and left biliary ducts at the level of the confluence and the gallbladder fundus is distended and stone filled. 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 Hepatoduodenal ligament lymphadenopathy versus direct tumor extension into the hepatoduodenal ligament. 1/29: CT abd/pelvis: 1. Hemoperitoneum, most evident adjacent to the liver, which is presumably the source of bleeding, subsequent to the liver biopsy performed today. This is new since the prior MRI. 2. Small amount of air is seen within the right lobe mass adjacent to the gallbladder, presumed secondary to the liver biopsy. 3. Hazy inflammatory type changes now noted along the pancreas which is likely secondary to the liver hemorrhage. AIR perc transhepatic cholangiogram : 1. Ultrasound-guided core biopsy  performed of a large right lobe hepatic mass. 2. Percutaneous transhepatic cholangiogram demonstrates diffusely dilated right lobe ducts superior to the mass. Despite ability to cannulate right lobe bile ducts, guidewire advancement was unsuccessful centrally and a biliary drainage catheter could not be placed today. Re-attempt will be made on a later date via either right lobe or left lobe biliary access. 3. Several hours after the procedure, it became evident that there was a bleeding complication with subcapsular hemorrhage present adjacent to the liver. This did lead to an emergent arteriogram and embolization procedure which will be dictated separately.   CULTURES: BC x 2 1/29>>>  ANTIBIOTICS: Zosyn 1/24>>>1/31 vanc (pharmacy dose) 1/31>>> Meropenem 1/31>>>  SIGNIFICANT EVENTS:   LINES/TUBES: Left [place] CVL 1/29>>>>  DISCUSSION: This is a 77 year old female who presents with newly diagnosed large liver mass causing obstructive jaundice.  ERCP attempt: unSuccessful.  attempt at percutaneous drain placement on 1/29 unsuccessful, hepatic bx was successful but c/b hemoperitoneum requiring hepatic artery embolization and 2 units PRBC Plan for today Continue pressors KVO IV fluids Chest x-ray Anticipate dialysis soon Follow-up liver biopsies Further recs per IR and GI  ASSESSMENT / PLAN:  Hemorrhagic shock in setting of Hemoperitoneum following liver Bx S/p successful  hepatic arteriogram w/ coil embolization to right hepatic artery (emergent 1/30) -She remains in shock, yet her acid-base has improved.  I am not convinced she is still actively bleeding.  With a white cell count elevated this does raise concern for infection.  She has undergone 2 procedures here in the hospital thus also raising risk Plan Continue norepinephrine for map greater than 65 Holding antihypertensives Continuing supportive care Will add vanc and meropenem (will have pharmacy dose)  mild trop  elevation in setting of hemorrhagic shock and interestingly no compensatory tachycardia.  Plan Order echocardiogram Continue telemetry monitoring  New acute hypoxic respiratory failure with associated orthopnea Suspect volume overload Plan Supplemental oxygen Chest x-ray May need dialysis for volume removal  Obstructive jaundice from large hepatic mass involving the GB; w/ elevated LFTs -->worse after hypotension reflecting element of shock liver?  -unsuccessful ERCP 1/28 w/ plan to place perc drain -1/29 unsuccessful perc drain  -1/29 Liver bx obtained.  Plan Continue n.p.o. status Eventually needs percutaneous biliary drain however would like to see things stabilize first Follow-up liver biopsies  Acute encephalopathy: in setting of shock; resolved.  Plan Limit narcotics Supportive care   Acute on chronic renal failure: initially improved. Baseline scr ~ 1.6.  Her serum creatinine continues to climb she is now oliguric; although she is not hyper Mervin Kung make her dyspnea may force our hand in regards for dialysis requirement -Nephro following Plan Continue norepinephrine for mean arterial pressure goal of greater than 65 IV fluid rate has been decreased Follow-up next chemistry I anticipate she will require dialysis in the very near future  Metabolic acidosis primarily NAG: suspect mix of AKI and hyperchloremia, improved with bicarb Plan Agree with decreasing IV fluid rate; I am worried she may be getting volume overloaded Continue serial chemistries  Acute blood loss anemia superimposed on chronic anemia  -> Her hemoglobin drifted down yesterday afternoon, however is staying stable in the interval from yesterday to this a.m. Plan No anticoagulation Transfuse for hemoglobin less than 7 A.m. CBC  Hypothyroidism Plan Continuing IV Synthroid until can take p.o.'s  Hyperglycemia  Plan Trend capillary blood glucose  DVT prophylaxis: scds SUP: PPI  Diet:  NPO Activity: BR Disposition : ICU   FAMILY  - Updates:   - Inter-disciplinary family meet or Palliative Care meeting due by: 2/5 45 min ccm time   Erick Colace ACNP-BC [place] Pager # [phone] OR # [phone] if no answer  09/15/2017, 8:27 AM  f

## 2017-09-15 NOTE — Progress Notes (Signed)
CRITICAL VALUE ALERT  Critical Value: Lactic Acid- 4.2, Creatinine- 5.0  Date & Time Notied: 09/15/17, 1337  Provider Notified: Marni Griffon, NP  Orders Received/Actions taken: Spoke with MD, awaiting orders

## 2017-09-15 NOTE — Progress Notes (Signed)
Referring Physician(s): Rande Brunt  Supervising Physician: Daryll Brod  Patient Status:  [facility] - In-pt  Chief Complaint: Hepatic mass, biliary obstruction, hepatic bleed post biopsy   Subjective: Pt sleepy but arousable; moans with palpation of abdomen; family in room; s/p HD cath placement this afternoon- CVVHDF planned   Allergies: Advil [ibuprofen]; Aleve [naproxen sodium]; and Naproxen  Medications: Prior to Admission medications   Medication Sig Start Date End Date Taking? Authorizing Provider  acetaminophen (TYLENOL) 650 MG CR tablet Take 1,300 mg by mouth daily as needed for pain.   Yes [provider]  Calcium Carbonate-Vitamin D (CALTRATE 600+D) 600-400 MG-UNIT per chew tablet Chew 1 tablet by mouth daily.    Yes [provider]  cetirizine (ZYRTEC) 10 MG tablet Take 10 mg by mouth daily.   Yes [provider]  levothyroxine (SYNTHROID, LEVOTHROID) 100 MCG tablet Take 100 mcg by mouth daily before breakfast.   Yes [provider]  multivitamin-lutein (OCUVITE-LUTEIN) CAPS capsule Take 1 capsule by mouth daily.   Yes [provider]  omeprazole (PRILOSEC) 20 MG capsule Take 1 capsule (20 mg total) by mouth daily. 08/30/17  Yes Isla Pence, MD  potassium chloride (MICRO-K) 10 MEQ CR capsule Take 10 mEq by mouth 2 (two) times daily.   Yes [provider]  sodium chloride (OCEAN) 0.65 % SOLN nasal spray Place 2 sprays into both nostrils as needed for congestion.   Yes [provider]  valsartan-hydrochlorothiazide (DIOVAN-HCT) 160-25 MG per tablet Take 1 tablet by mouth daily.  01/03/14  Yes [provider]     Vital Signs: BP (!) 120/32   Pulse 96   Temp (!) 97.3 F (36.3 C) (Oral)   Resp 19   Ht 5\' 5"  (1.651 m)   Wt 257 lb 4.4 oz (116.7 kg)   SpO2 91%   BMI 42.81 kg/m   Physical Exam sleepy, arousable; puncture sites RUQ/epig clean and dry, still with tenderness to palpation ; rt CFA site  clean, soft,NT, no hematoma  Imaging: Ct Abdomen Pelvis Wo Contrast  Result Date: 09/13/2017 CLINICAL DATA:  S/p liver bx today. hgb dropped, blood pressure dropped. Right sided pain EXAM: CT ABDOMEN AND PELVIS WITHOUT CONTRAST TECHNIQUE: Multidetector CT imaging of the abdomen and pelvis was performed following the standard protocol without IV contrast. COMPARISON:  MRI of the liver, MRCP, 09/10/2017 FINDINGS: Lower chest: Status post right mastectomy. Small right minimal left pleural effusions with associated mild dependent lower lobe atelectasis. Heart mildly enlarged. Hepatobiliary: There is complex fluid surrounding the liver consistent with hemorrhage. Bubbles of air are seen anterior to the gallbladder, within the right lobe mass/lesion. Dense contrast is seen within the dependent portions of the dilated intrahepatic bile ducts with a small amount of contrast noted in a normal caliber common bile duct. Multiple dependent gallstones are noted. Pancreas: No pancreatic masses. There is mild peripancreatic stranding, new since prior MRI. Spleen: Normal in size.  No mass or focal lesion. Adrenals/Urinary Tract: No adrenal masses. Bilateral renal cortical thinning. No renal masses. No stones. No hydronephrosis. Ureters are normal course and in caliber. Bladder is decompressed with a Foley catheter. Stomach/Bowel: Stomach and small bowel unremarkable. Colon is normal in caliber. There are multiple left colon diverticula. No diverticulitis or other colonic inflammatory process. Normal appendix visualized. Vascular/Lymphatic: No discrete enlarged lymph nodes. There is aortic atherosclerosis. No aneurysm. Reproductive: Uterus and bilateral adnexa are unremarkable. Other: Hemoperitoneum. Complex fluid with areas of higher attenuation surrounds the liver, most evident  anteriorly and laterally, extending inferiorly along the right pericolic gutter and anterior pararenal space into the pelvis. Lower attenuation fluid  is seen adjacent to the spleen and along the left pericolic gutter. Musculoskeletal: No fracture or acute finding. No osteoblastic or osteolytic lesions. IMPRESSION: 1. Hemoperitoneum, most evident adjacent to the liver, which is presumably the source of bleeding, subsequent to the liver biopsy performed today. This is new since the prior MRI. 2. Small amount of air is seen within the right lobe mass adjacent to the gallbladder, presumed secondary to the liver biopsy. 3. Hazy inflammatory type changes now noted along the pancreas which is likely secondary to the liver hemorrhage. Electronically Signed   By: Lajean Manes M.D.   On: 09/13/2017 17:36   Ir Angiogram Visceral Selective  Result Date: 09/14/2017 INDICATION: Acute perihepatic hematoma, status post liver lesion core biopsy EXAM: Ultrasound guidance for vascular access Celiac and peripheral right hepatic artery catheterizations and angiograms. Peripheral right hepatic artery branch micro coil embolization for pseudoaneurysms and active bleeding. MEDICATIONS: 1% lidocaine local. ANESTHESIA/SEDATION: Moderate Sedation Time: None. The patient's level of consciousness and vital signs were monitored continuously by radiology nursing throughout the procedure under my direct supervision. CONTRAST:  200 cc Isovue-300 FLUOROSCOPY TIME:  Fluoroscopy Time: 17 minutes 48 seconds (3,604 mGy). COMPLICATIONS: None immediate. PROCEDURE: Informed consent was obtained from the patient following explanation of the procedure, risks, benefits and alternatives. The patient understands, agrees and consents for the procedure. All questions were addressed. A time out was performed prior to the initiation of the procedure. Maximal barrier sterile technique utilized including caps, mask, sterile gowns, sterile gloves, large sterile drape, hand hygiene, and Betadine prep. Under sterile conditions and local anesthesia, ultrasound micropuncture access performed of the right common  femoral artery. Five French sheath inserted. Initially a C2 catheter was advanced. Because of angulation C2 catheter would not select the celiac origin. Catheter exchanged for a Mickelson catheter. Mickelson catheter reformed in the aorta and utilized to eventually select the celiac origin. Selective celiac angiogram performed. Celiac: Celiac origin is patent. Vessels demonstrate diffuse vaso constriction secondary to the acute shock. The splenic, and hepatic branches are patent. Peripheral right hepatic artery active bleeding sites demonstrated within the liver compatible with areas of pseudoaneurysms. Mickelson catheter was directed to the hepatic vasculature. Through the access, a Renegade STC microcatheter was advanced. Catheter was advanced into the right hepatic peripheral branches. Additional angiograms performed. A mid right hepatic branch was localized as the area of active bleeding. Microcatheter was advanced into the specific branch. Additional selective angiograms performed. Eventually, the microcatheter was advanced peripherally within a right hepatic branch close to the 2 areas of active bleeding/pseudoaneurysms. 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 Active bleeding is demonstrated into the perihepatic hematoma. No biliary communication. Micro coil embolization: For embolization of the bleeding site, 4 3 mm interlock micro coils were deployed within a mid peripheral right hepatic branch. Post embolization angiogram demonstrates no further active bleeding. Catheter was retracted. Additional right hepatic angiograms also confirm no further active bleeding. Microcatheter removed. Final celiac angiogram reconfirms no further active bleeding. Access removed. Hemostasis obtained with an ExoSeal. Patient tolerated procedure well. No immediate complication. 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 Post embolization angiogram confirms arterial occlusion of this branch and no  further active bleeding. Electronically Signed   By: Jerilynn Mages.  Shick M.D.   On: 09/14/2017 11:01   Ir Angiogram Selective Each Additional Vessel  Result Date: 09/14/2017 INDICATION: Acute perihepatic hematoma, status post liver lesion core biopsy EXAM: Ultrasound guidance for vascular access Celiac and peripheral right hepatic artery catheterizations and angiograms. Peripheral right hepatic artery branch micro coil embolization for pseudoaneurysms and active bleeding. MEDICATIONS: 1% lidocaine local. ANESTHESIA/SEDATION: Moderate Sedation Time: None. The patient's level of consciousness and vital signs were monitored continuously by radiology nursing throughout the procedure under my direct supervision. CONTRAST:  200 cc Isovue-300 FLUOROSCOPY TIME:  Fluoroscopy Time: 17 minutes 48 seconds (3,604 mGy). COMPLICATIONS: None immediate. PROCEDURE: Informed consent was obtained from the patient following explanation of the procedure, risks, benefits and alternatives. The patient understands, agrees and consents for the procedure. All questions were addressed. A time out was performed prior to the initiation of the procedure. Maximal barrier sterile technique utilized including caps, mask, sterile gowns, sterile gloves, large sterile drape, hand hygiene, and Betadine prep. Under sterile conditions and local anesthesia, ultrasound micropuncture access performed of the right common femoral artery. Five French sheath inserted. Initially a C2 catheter was advanced. Because of angulation C2 catheter would not select the celiac origin. Catheter exchanged for a Mickelson catheter. Mickelson catheter reformed in the aorta and utilized to eventually select the celiac origin. Selective celiac angiogram performed.  Celiac: Celiac origin is patent. Vessels demonstrate diffuse vaso constriction secondary to the acute shock. The splenic, and hepatic branches are patent. Peripheral right hepatic artery active bleeding sites demonstrated within the liver compatible with areas of pseudoaneurysms. Mickelson catheter was directed to the hepatic vasculature. Through the access, a Renegade STC microcatheter was advanced. Catheter was advanced into the right hepatic peripheral branches. Additional angiograms performed. A mid right hepatic branch was localized as the area of active bleeding. Microcatheter was advanced into the specific branch. Additional selective angiograms performed. Eventually, the microcatheter was advanced peripherally within a right hepatic branch close to the 2 areas of active bleeding/pseudoaneurysms. 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 Active bleeding is demonstrated into the perihepatic hematoma. No biliary communication. Micro coil embolization: For embolization of the bleeding site, 4 3 mm interlock micro coils were deployed within a mid peripheral right hepatic branch. Post embolization angiogram demonstrates no further active bleeding. Catheter was retracted. Additional right hepatic angiograms also confirm no further active bleeding. Microcatheter removed. Final celiac angiogram reconfirms no further active bleeding. Access removed. Hemostasis obtained with an ExoSeal. Patient tolerated procedure well. No immediate complication. 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 Post embolization angiogram confirms arterial occlusion of this branch and no further active bleeding. Electronically Signed   By: Jerilynn Mages.  Shick M.D.   On: 09/14/2017 11:01   Ir Angiogram  Selective Each Additional Vessel  Result Date: 09/14/2017 INDICATION: Acute perihepatic hematoma, status post liver lesion core biopsy EXAM: Ultrasound guidance for vascular access Celiac and peripheral right hepatic artery catheterizations and angiograms. Peripheral right hepatic artery branch micro coil embolization for pseudoaneurysms and active bleeding. MEDICATIONS: 1% lidocaine local. ANESTHESIA/SEDATION: Moderate Sedation Time: None. The patient's level of consciousness and vital signs were monitored continuously by radiology nursing throughout the procedure under my direct supervision. CONTRAST:  200 cc Isovue-300 FLUOROSCOPY TIME:  Fluoroscopy Time: 17 minutes  48 seconds (3,604 mGy). COMPLICATIONS: None immediate. PROCEDURE: Informed consent was obtained from the patient following explanation of the procedure, risks, benefits and alternatives. The patient understands, agrees and consents for the procedure. All questions were addressed. A time out was performed prior to the initiation of the procedure. Maximal barrier sterile technique utilized including caps, mask, sterile gowns, sterile gloves, large sterile drape, hand hygiene, and Betadine prep. Under sterile conditions and local anesthesia, ultrasound micropuncture access performed of the right common femoral artery. Five French sheath inserted. Initially a C2 catheter was advanced. Because of angulation C2 catheter would not select the celiac origin. Catheter exchanged for a Mickelson catheter. Mickelson catheter reformed in the aorta and utilized to eventually select the celiac origin. Selective celiac angiogram performed. Celiac: Celiac origin is patent. Vessels demonstrate diffuse vaso constriction secondary to the acute shock. The splenic, and hepatic branches are patent. Peripheral right hepatic artery active bleeding sites demonstrated within the liver compatible with areas of pseudoaneurysms. Mickelson catheter was directed to the hepatic  vasculature. Through the access, a Renegade STC microcatheter was advanced. Catheter was advanced into the right hepatic peripheral branches. Additional angiograms performed. A mid right hepatic branch was localized as the area of active bleeding. Microcatheter was advanced into the specific branch. Additional selective angiograms performed. Eventually, the microcatheter was advanced peripherally within a right hepatic branch close to the 2 areas of active bleeding/pseudoaneurysms. 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 Active bleeding is demonstrated into the perihepatic hematoma. No biliary communication. Micro coil embolization: For embolization of the bleeding site, 4 3 mm interlock micro coils were deployed within a mid peripheral right hepatic branch. Post embolization angiogram demonstrates no further active bleeding. Catheter was retracted. Additional right hepatic angiograms also confirm no further active bleeding. Microcatheter removed. Final celiac angiogram reconfirms no further active bleeding. Access removed. Hemostasis obtained with an ExoSeal. Patient tolerated procedure well. No immediate complication. 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 Post embolization angiogram confirms arterial occlusion of this branch and no further active bleeding. Electronically Signed   By: Jerilynn Mages.  Shick M.D.   On: 09/14/2017 11:01   Ir Angiogram Selective Each Additional Vessel  Result Date: 09/14/2017 INDICATION: Acute perihepatic hematoma, status post liver lesion core biopsy EXAM: Ultrasound guidance for vascular access Celiac and peripheral right hepatic artery catheterizations and angiograms. Peripheral right hepatic artery branch micro coil embolization for  pseudoaneurysms and active bleeding. MEDICATIONS: 1% lidocaine local. ANESTHESIA/SEDATION: Moderate Sedation Time: None. The patient's level of consciousness and vital signs were monitored continuously by radiology nursing throughout the procedure under my direct supervision. CONTRAST:  200 cc Isovue-300 FLUOROSCOPY TIME:  Fluoroscopy Time: 17 minutes 48 seconds (3,604 mGy). COMPLICATIONS: None immediate. PROCEDURE: Informed consent was obtained from the patient following explanation of the procedure, risks, benefits and alternatives. The patient understands, agrees and consents for the procedure. All questions were addressed. A time out was performed prior to the initiation of the procedure. Maximal barrier sterile technique utilized including caps, mask, sterile gowns, sterile gloves, large sterile drape, hand hygiene, and Betadine prep. Under sterile conditions and local anesthesia, ultrasound micropuncture access performed of the right common femoral artery. Five French sheath inserted. Initially a C2 catheter was advanced. Because of angulation C2 catheter would not select the celiac origin. Catheter exchanged for  a Mickelson catheter. Mickelson catheter reformed in the aorta and utilized to eventually select the celiac origin. Selective celiac angiogram performed. Celiac: Celiac origin is patent. Vessels demonstrate diffuse vaso constriction secondary to the acute shock. The splenic, and hepatic branches are patent. Peripheral right hepatic artery active bleeding sites demonstrated within the liver compatible with areas of pseudoaneurysms. Mickelson catheter was directed to the hepatic vasculature. Through the access, a Renegade STC microcatheter was advanced. Catheter was advanced into the right hepatic peripheral branches. Additional angiograms performed. A mid right hepatic branch was localized as the area of active bleeding. Microcatheter was advanced into the specific branch. Additional selective  angiograms performed. Eventually, the microcatheter was advanced peripherally within a right hepatic branch close to the 2 areas of active bleeding/pseudoaneurysms. 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 Active bleeding is demonstrated into the perihepatic hematoma. No biliary communication. Micro coil embolization: For embolization of the bleeding site, 4 3 mm interlock micro coils were deployed within a mid peripheral right hepatic branch. Post embolization angiogram demonstrates no further active bleeding. Catheter was retracted. Additional right hepatic angiograms also confirm no further active bleeding. Microcatheter removed. Final celiac angiogram reconfirms no further active bleeding. Access removed. Hemostasis obtained with an ExoSeal. Patient tolerated procedure well. No immediate complication. 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 Post embolization angiogram confirms arterial occlusion of this branch and no further active bleeding. Electronically Signed   By: Jerilynn Mages.  Shick M.D.   On: 09/14/2017 11:01   Ir US Guide Vasc Access Right  Result Date: 09/14/2017 INDICATION: Acute perihepatic hematoma, status post liver lesion core biopsy EXAM: Ultrasound guidance for vascular access Celiac and peripheral right hepatic artery catheterizations and angiograms. Peripheral right hepatic artery branch micro coil embolization for pseudoaneurysms and active bleeding. MEDICATIONS: 1% lidocaine local. ANESTHESIA/SEDATION: Moderate Sedation Time: None. The patient's level of consciousness and vital signs were monitored continuously by radiology nursing throughout the procedure under my direct supervision. CONTRAST:  200 cc Isovue-300 FLUOROSCOPY TIME:  Fluoroscopy Time:  17 minutes 48 seconds (3,604 mGy). COMPLICATIONS: None immediate. PROCEDURE: Informed consent was obtained from the patient following explanation of the procedure, risks, benefits and alternatives. The patient understands, agrees and consents for the procedure. All questions were addressed. A time out was performed prior to the initiation of the procedure. Maximal barrier sterile technique utilized including caps, mask, sterile gowns, sterile gloves, large sterile drape, hand hygiene, and Betadine prep. Under sterile conditions and local anesthesia, ultrasound micropuncture access performed of the right common femoral artery. Five French sheath inserted. Initially a C2 catheter was advanced. Because of angulation C2 catheter would not select the celiac origin. Catheter exchanged for a Mickelson catheter. Mickelson catheter reformed in the aorta and utilized to eventually select the celiac origin. Selective celiac angiogram performed. Celiac: Celiac origin is patent. Vessels demonstrate diffuse vaso constriction secondary to the acute shock. The splenic, and hepatic branches are patent. Peripheral right hepatic artery active bleeding sites demonstrated within the liver compatible with areas of pseudoaneurysms. Mickelson catheter was directed to the hepatic vasculature. Through the access, a Renegade STC microcatheter was advanced. Catheter was advanced into the right hepatic peripheral branches. Additional angiograms performed. A mid right hepatic branch was localized as the area of active bleeding. Microcatheter was advanced into the specific branch. Additional  selective angiograms performed. Eventually, the microcatheter was advanced peripherally within a right hepatic branch close to the 2 areas of active bleeding/pseudoaneurysms. 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 Active bleeding is demonstrated into the perihepatic hematoma. No biliary communication.  Micro coil embolization: For embolization of the bleeding site, 4 3 mm interlock micro coils were deployed within a mid peripheral right hepatic branch. Post embolization angiogram demonstrates no further active bleeding. Catheter was retracted. Additional right hepatic angiograms also confirm no further active bleeding. Microcatheter removed. Final celiac angiogram reconfirms no further active bleeding. Access removed. Hemostasis obtained with an ExoSeal. Patient tolerated procedure well. No immediate complication. 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 Post embolization angiogram confirms arterial occlusion of this branch and no further active bleeding. Electronically Signed   By: Jerilynn Mages.  Shick M.D.   On: 09/14/2017 11:01   Ir US Guide Bx Asp/drain  Result Date: 09/14/2017 INDICATION: Large 9 cm mass of the right lobe of the liver causing biliary obstruction and invading the gallbladder. The patient presents for biopsy of the mass as well as percutaneous cholangiogram and biliary drainage due to inability to cannulate the common bile duct by ERCP due to biliary stricture. EXAM: 1. ULTRASOUND CORE BIOPSY OF HEPATIC MASS 2. PERCUTANEOUS TRANSHEPATIC CHOLANGIOGRAM WITH ULTRASOUND GUIDANCE FOR BILIARY ACCESS MEDICATIONS: 3.375 g IV Zosyn; The antibiotic was administered within an appropriate time frame prior to the initiation of the procedure. ANESTHESIA/SEDATION: Moderate (conscious) sedation was employed during this procedure. A total of Versed 4.0 mg and Fentanyl 200 mcg was administered intravenously. Moderate Sedation Time: 84 minutes. The patient's level of consciousness and vital signs were monitored continuously by radiology nursing throughout the procedure under my direct supervision. FLUOROSCOPY TIME:  Fluoroscopy  Time: 5 minutes and 18 seconds. 255 mGy. COMPLICATIONS: A few hours following the procedure, it became evident that there was likely a bleeding complication due to hypotension and drop in hemoglobin. CT confirmed the presence of a large subcapsular hemorrhage surrounding the liver. Subsequent emergent hepatic arteriography and embolization was performed to treat bleeding. SIR LEVEL C - Requires therapy, minor hospitalization (<48 hrs). PROCEDURE: Informed written consent was obtained from the patient after a thorough discussion of the procedural risks, benefits and alternatives. All questions were addressed. Maximal Sterile Barrier Technique was utilized including caps, mask, sterile gowns, sterile gloves, sterile drape, hand hygiene and skin antiseptic. Chlorhexidine was used for skin prep. Local anesthesia was provided with 1% lidocaine. A timeout was performed prior to the initiation of the procedure. Ultrasound was used to initially localize a right lobe hepatic mass. Under ultrasound guidance, a 17 gauge needle was advanced into the hepatic mass. A total of 3 separate coaxial 18 gauge core biopsy samples were obtained of the mass and samples submitted in formalin. Following the biopsy, Gel-Foam pledgets were injected through the outer needle as the needle was retracted. Ultrasound was performed of the right lobe of the liver. Under ultrasound guidance, a 21 gauge needle was advanced into a right lobe bile duct. Contrast was injected through the needle and a cholangiogram performed with several fluoroscopic images obtained. A guidewire was then advanced through the needle under fluoroscopy. Additional right lobe biliary access was performed with the 21 gauge needle. Further contrast injection was performed. Guidewire advancement was again performed. Further  attempts were also made to access bile ducts slightly more inferiorly in the right lobe. FINDINGS: Ultrasound demonstrates a lobulated, ill-defined  hypoechoic mass within the inferior right lobe extending into the porta hepatis. Solid tissue was obtained from the hepatic mass. Bile ducts in the right lobe are dilated superior to the mass and biliary anatomy is distorted due to mass effect from the underlying hepatic mass. After access of a dilated bile duct, contrast injection shows diffuse dilatation of right lobe ducts. Guidewire advancement was difficult and only either extended for a short distance or in a retrograde fashion more peripherally in the liver. Central guidewire access could not be obtained. Due to difficulty in obtaining biliary access and guidewire advancement as well as length of the procedure, decision was made to abort attempts at biliary drain placement today. Re-attempt will be made on a later date. IMPRESSION: 1. Ultrasound-guided core biopsy performed of a large right lobe hepatic mass. 2. Percutaneous transhepatic cholangiogram demonstrates diffusely dilated right lobe ducts superior to the mass. Despite ability to cannulate right lobe bile ducts, guidewire advancement was unsuccessful centrally and a biliary drainage catheter could not be placed today. Re-attempt will be made on a later date via either right lobe or left lobe biliary access. 3. Several hours after the procedure, it became evident that there was a bleeding complication with subcapsular hemorrhage present adjacent to the liver. This did lead to an emergent arteriogram and embolization procedure which will be dictated separately. Electronically Signed   By: Aletta Edouard M.D.   On: 09/14/2017 09:23   Dg Chest Port 1 View  Result Date: 09/15/2017 CLINICAL DATA:  Respiratory distress, line placement EXAM: PORTABLE CHEST 1 VIEW COMPARISON:  Portable exam 1431 hours compared to 09/15/2017 FINDINGS: LEFT subclavian central venous catheter with tip projecting over SVC. RIGHT jugular central venous catheter with tip projecting over SVC. Enlargement of cardiac silhouette  with vascular congestion. Perihilar edema. Atherosclerotic calcification aorta. Moderate LEFT pleural effusion and basilar atelectasis. Minimal RIGHT basilar atelectasis as well. No pneumothorax. Bones demineralized. IMPRESSION: No pneumothorax following RIGHT jugular line placement. Increased LEFT pleural effusion and basilar atelectasis versus consolidation. Enlargement of cardiac silhouette with vascular congestion and mild pulmonary edema. Electronically Signed   By: Lavonia Dana M.D.   On: 09/15/2017 14:58   Dg Chest Port 1 View  Result Date: 09/15/2017 CLINICAL DATA:  F/u SOB EXAM: PORTABLE CHEST 1 VIEW COMPARISON:  Chest x-ray 09/13/2017 FINDINGS: Left-sided subclavian central line tip overlies the level of superior vena cava. Shallow lung inflation. The heart is enlarged. There is increasing opacity in the left lung base, now completely obscuring the left hemidiaphragm. Suspect left pleural effusion. There are interstitial changes compatible with mild edema surgically well seen in the right lung base. Significant atherosclerotic calcification. IMPRESSION: 1. Increased left lower lobe atelectasis or infiltrate and pleural effusion. 2. Interstitial edema. 3.  Aortic atherosclerosis.  (ICD10-I70.0) Electronically Signed   By: Nolon Nations M.D.   On: 09/15/2017 09:07   Dg Chest Port 1 View  Result Date: 09/13/2017 CLINICAL DATA:  Central line placement. EXAM: PORTABLE CHEST 1 VIEW COMPARISON:  09/09/2017 FINDINGS: New left subclavian central venous line has its tip projecting just above the caval atrial junction. No pneumothorax. Cardiac silhouette is normal in size. No mediastinal or hilar masses. Clear lungs. IMPRESSION: 1. New left subclavian central venous line is well positioned, tip projecting just above the caval atrial junction. 2. No pneumothorax.  No other change from the prior study. Electronically  Signed   By: Lajean Manes M.D.   On: 09/13/2017 16:42   Dg C-arm 1-60 Min-no  Report  Result Date: 09/01/2017 Fluoroscopy was utilized by the requesting physician.  No radiographic interpretation.   Ir Percutaneous Transhepatic Cholangiogram  Result Date: 09/14/2017 INDICATION: Large 9 cm mass of the right lobe of the liver causing biliary obstruction and invading the gallbladder. The patient presents for biopsy of the mass as well as percutaneous cholangiogram and biliary drainage due to inability to cannulate the common bile duct by ERCP due to biliary stricture. EXAM: 1. ULTRASOUND CORE BIOPSY OF HEPATIC MASS 2. PERCUTANEOUS TRANSHEPATIC CHOLANGIOGRAM WITH ULTRASOUND GUIDANCE FOR BILIARY ACCESS MEDICATIONS: 3.375 g IV Zosyn; The antibiotic was administered within an appropriate time frame prior to the initiation of the procedure. ANESTHESIA/SEDATION: Moderate (conscious) sedation was employed during this procedure. A total of Versed 4.0 mg and Fentanyl 200 mcg was administered intravenously. Moderate Sedation Time: 84 minutes. The patient's level of consciousness and vital signs were monitored continuously by radiology nursing throughout the procedure under my direct supervision. FLUOROSCOPY TIME:  Fluoroscopy Time: 5 minutes and 18 seconds. 255 mGy. COMPLICATIONS: A few hours following the procedure, it became evident that there was likely a bleeding complication due to hypotension and drop in hemoglobin. CT confirmed the presence of a large subcapsular hemorrhage surrounding the liver. Subsequent emergent hepatic arteriography and embolization was performed to treat bleeding. SIR LEVEL C - Requires therapy, minor hospitalization (<48 hrs). PROCEDURE: Informed written consent was obtained from the patient after a thorough discussion of the procedural risks, benefits and alternatives. All questions were addressed. Maximal Sterile Barrier Technique was utilized including caps, mask, sterile gowns, sterile gloves, sterile drape, hand hygiene and skin antiseptic. Chlorhexidine was used  for skin prep. Local anesthesia was provided with 1% lidocaine. A timeout was performed prior to the initiation of the procedure. Ultrasound was used to initially localize a right lobe hepatic mass. Under ultrasound guidance, a 17 gauge needle was advanced into the hepatic mass. A total of 3 separate coaxial 18 gauge core biopsy samples were obtained of the mass and samples submitted in formalin. Following the biopsy, Gel-Foam pledgets were injected through the outer needle as the needle was retracted. Ultrasound was performed of the right lobe of the liver. Under ultrasound guidance, a 21 gauge needle was advanced into a right lobe bile duct. Contrast was injected through the needle and a cholangiogram performed with several fluoroscopic images obtained. A guidewire was then advanced through the needle under fluoroscopy. Additional right lobe biliary access was performed with the 21 gauge needle. Further contrast injection was performed. Guidewire advancement was again performed. Further attempts were also made to access bile ducts slightly more inferiorly in the right lobe. FINDINGS: Ultrasound demonstrates a lobulated, ill-defined hypoechoic mass within the inferior right lobe extending into the porta hepatis. Solid tissue was obtained from the hepatic mass. Bile ducts in the right lobe are dilated superior to the mass and biliary anatomy is distorted due to mass effect from the underlying hepatic mass. After access of a dilated bile duct, contrast injection shows diffuse dilatation of right lobe ducts. Guidewire advancement was difficult and only either extended for a short distance or in a retrograde fashion more peripherally in the liver. Central guidewire access could not be obtained. Due to difficulty in obtaining biliary access and guidewire advancement as well as length of the procedure, decision was made to abort attempts at biliary drain placement today. Re-attempt will be made on  a later date.  IMPRESSION: 1. Ultrasound-guided core biopsy performed of a large right lobe hepatic mass. 2. Percutaneous transhepatic cholangiogram demonstrates diffusely dilated right lobe ducts superior to the mass. Despite ability to cannulate right lobe bile ducts, guidewire advancement was unsuccessful centrally and a biliary drainage catheter could not be placed today. Re-attempt will be made on a later date via either right lobe or left lobe biliary access. 3. Several hours after the procedure, it became evident that there was a bleeding complication with subcapsular hemorrhage present adjacent to the liver. This did lead to an emergent arteriogram and embolization procedure which will be dictated separately. Electronically Signed   By: Aletta Edouard M.D.   On: 09/14/2017 09:23   [place] Guide Roadmapping  Result Date: 09/14/2017 INDICATION: Acute perihepatic hematoma, status post liver lesion core biopsy EXAM: Ultrasound guidance for vascular access Celiac and peripheral right hepatic artery catheterizations and angiograms. Peripheral right hepatic artery branch micro coil embolization for pseudoaneurysms and active bleeding. MEDICATIONS: 1% lidocaine local. ANESTHESIA/SEDATION: Moderate Sedation Time: None. The patient's level of consciousness and vital signs were monitored continuously by radiology nursing throughout the procedure under my direct supervision. CONTRAST:  200 cc Isovue-300 FLUOROSCOPY TIME:  Fluoroscopy Time: 17 minutes 48 seconds (3,604 mGy). COMPLICATIONS: None immediate. PROCEDURE: Informed consent was obtained from the patient following explanation of the procedure, risks, benefits and alternatives. The patient understands, agrees and consents for the procedure. All questions were addressed. A time out was performed prior to the initiation of the procedure. Maximal barrier sterile technique utilized including caps, mask, sterile gowns, sterile gloves, large  sterile drape, hand hygiene, and Betadine prep. Under sterile conditions and local anesthesia, ultrasound micropuncture access performed of the right common femoral artery. Five French sheath inserted. Initially a C2 catheter was advanced. Because of angulation C2 catheter would not select the celiac origin. Catheter exchanged for a Mickelson catheter. Mickelson catheter reformed in the aorta and utilized to eventually select the celiac origin. Selective celiac angiogram performed. Celiac: Celiac origin is patent. Vessels demonstrate diffuse vaso constriction secondary to the acute shock. The splenic, and hepatic branches are patent. Peripheral right hepatic artery active bleeding sites demonstrated within the liver compatible with areas of pseudoaneurysms. Mickelson catheter was directed to the hepatic vasculature. Through the access, a Renegade STC microcatheter was advanced. Catheter was advanced into the right hepatic peripheral branches. Additional angiograms performed. A mid right hepatic branch was localized as the area of active bleeding. Microcatheter was advanced into the specific branch. Additional selective angiograms performed. Eventually, the microcatheter was advanced peripherally within a right hepatic branch close to the 2 areas of active bleeding/pseudoaneurysms. 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 Active bleeding is demonstrated into the perihepatic hematoma. No biliary communication. Micro coil embolization: For embolization of the bleeding site, 4 3 mm interlock micro coils were deployed within a mid peripheral right hepatic branch. Post embolization angiogram demonstrates no further active bleeding. Catheter was retracted. Additional right hepatic angiograms also confirm no further active bleeding. Microcatheter removed. Final celiac angiogram reconfirms no further active bleeding. Access removed. Hemostasis obtained with an ExoSeal.  Patient tolerated procedure well. No immediate complication. 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 Post embolization angiogram confirms arterial occlusion of this  branch and no further active bleeding. Electronically Signed   By: Jerilynn Mages.  Shick M.D.   On: 09/14/2017 11:01    Labs:  CBC: Recent Labs    09/14/17 0530 09/14/17 1344 09/15/17 0500 09/15/17 1101  WBC 25.5* 22.5* 27.1* 28.4*  HGB 9.0* 7.7* 7.8* 7.9*  HCT 25.8* 21.6* 21.5* 21.7*  PLT 225 171 153 156    COAGS: Recent Labs    09/08/2017 0455 09/13/17 0505 09/14/17 0530 09/15/17 0500  INR 1.36 1.50 2.32 2.74    BMP: Recent Labs    09/14/17 0530 09/14/17 1344 09/15/17 0500 09/15/17 1101  NA 142 144 142 142  K 3.8 3.6 3.8 4.1  CL 112* 111 106 104  CO2 18* 21* 24 24  GLUCOSE 218* 140* 113* 104*  BUN 42* 45* 49* 51*  CALCIUM 7.0* 6.9* 6.6* 6.6*  CREATININE 3.56* 3.97* 4.54* 5.00*  GFRNONAA 11* 10* 9* 8*  GFRAA 13* 12* 10* 9*    LIVER FUNCTION TESTS: Recent Labs    09/13/17 0505 09/13/17 1532 09/14/17 0530 09/15/17 0500  BILITOT 15.0* 11.2* 14.7* 16.5*  AST 103* 109* 3,689* 5,328*  ALT 86* 76* 1,645* 2,228*  ALKPHOS 215* 149* 189* 320*  PROT 5.7* 4.1* 4.6* 4.5*  ALBUMIN 1.9* 1.5* 1.7* 1.8*    Assessment and Plan: S/p perc liver mass bx/attempted biliary drain placement 1/29 with subsequent perihepatic hematoma and coil embo of right hepatic artery PSA's; afebrile; anuric; HD planned; still on levophed; creat 5.00(4.54), WBC 28.4(27.1), hgb 7.9(7.8), plts 156k, PT 28.8, INR 2.74; t bili 16.5(14.7), rising LFT's; blood cx pend; urine cx neg; liver path pend; plans as per CCM/NEPH/GI   Electronically Signed: D. Rowe Robert, PA-C 09/15/2017, 3:48 PM   I spent a total of 15 minutes  at the the patient's bedside  AND on the patient's hospital floor or unit, greater than 50% of which was counseling/coordinating care for liver biopsy/hepatic artery embolization     Patient ID: Chelsey Savage, female   DOB: 1941/06/23, 77 y.o.   MRN: [id number]

## 2017-09-15 NOTE — Progress Notes (Addendum)
Pharmacy Antibiotic Note  Chelsey Savage is a 77 y.o. female admitted on 08/21/2017 with obstructive jaundice. Cholelithiasis and dilated intra and extrahepatic bile ducts on ultrasound. Day #7 Zosyn. WBC and clinical picture worsening today. Note patient is s/p 2 procedures this admission.  Broadening antibiotics to vancomycin and meropenem per pharmacy dosing.  Nephrology following for potential CRRT.    Patient volume overloaded. Admission weight 96 kg, adjusted body weight 72 kg.  Plan: - Vancomycin 1500 mg IV x 1.  Will dose base on levels in setting of AKI.  Patient potential CRRT start. - Meropenem 1g IV q12h.  ADDENDUM 09/15/2017 2:22 PM CVVHDF ordered to start now. Plan: Continue Meropenem 1g IV q12h. Vancomycin 1g IV q24h. Next dose tomorrow.  Height: 5\' 5"  (165.1 cm) Weight: 257 lb 4.4 oz (116.7 kg) IBW/kg (Calculated) : 57  Temp (24hrs), Avg:97.5 F (36.4 C), Min:97.2 F (36.2 C), Max:98.2 F (36.8 C)  Recent Labs  Lab 09/13/17 1532 09/13/17 1700 09/13/17 2000 09/13/17 2200 09/14/17 0200 09/14/17 0530 09/14/17 0600 09/14/17 1344 09/14/17 1345 09/15/17 0500  WBC 10.6*  --   --   --  25.0* 25.5*  --  22.5*  --  27.1*  CREATININE 3.35*  --   --  3.60*  --  3.56*  --  3.97*  --  4.54*  LATICACIDVEN  --  2.3* 8.0*  --   --   --  4.9*  --  3.9*  --     Estimated Creatinine Clearance: 13.5 mL/min (A) (by C-G formula based on SCr of 4.54 mg/dL (H)).    Allergies  Allergen Reactions  . Advil [Ibuprofen] Other (See Comments)    Kidney MD said not to take  . Aleve [Naproxen Sodium] Other (See Comments)    Kidney MD said not to take  . Naproxen Other (See Comments)    Kidney MD said not to take    Antimicrobials this admission: 1/25 Zosyn >> 1/31 1/31 Vancomycin >> 1/31 Meropenem >>  Dose adjustments this admission: 1/27: adjust from 2.25g q6h to ZEI for improvement in renal fxn 1/29: adjusted back to 2.25g for CrCl < 20  Microbiology results: 1/27 UCx:  NGF  1/29 BCx: ngtd  Thank you for allowing pharmacy to be a part of this patient's care.  Hershal Coria, PharmD, BCPS Pager: [phone] 09/15/2017 8:58 AM

## 2017-09-15 NOTE — Procedures (Signed)
Central Venous dialysis Catheter Insertion Procedure Note BRANDICE BUSSER [id number] 1941/03/28  Procedure: Insertion of Central Venous Catheter Indications: dialysis   Procedure Details Consent: Risks of procedure as well as the alternatives and risks of each were explained to the (patient/caregiver).  Consent for procedure obtained. Time Out: Verified patient identification, verified procedure, site/side was marked, verified correct patient position, special equipment/implants available, medications/allergies/relevent history reviewed, required imaging and test results available.  Performed Real time [place] used to ID and cannulate vessel  Maximum sterile technique was used including antiseptics, cap, gloves, gown, hand hygiene, mask and sheet. Skin prep: Chlorhexidine; local anesthetic administered A antimicrobial bonded/coated triple lumen catheter was placed in the right internal jugular vein using the Seldinger technique.  Evaluation Blood flow good Complications: No apparent complications Patient did tolerate procedure well. Chest X-ray ordered to verify placement.  CXR: pending.  [name] 09/15/2017, 2:14 PM  Erick Colace ACNP-BC [place] Pager # [phone] OR # [phone] if no answer

## 2017-09-15 NOTE — Progress Notes (Signed)
Patient ID: Chelsey Savage, female   DOB: [birth year], 77 y.o.   MRN: [id number] S:Called to see pt due to increased work of breathing and lack of UOP and possible need for CRRT O:BP (!) 123/39   Pulse 97   Temp (!) 97.3 F (36.3 C) (Oral)   Resp 20   Ht 5\' 5"  (1.651 m)   Wt 116.7 kg (257 lb 4.4 oz)   SpO2 (!) 89%   BMI 42.81 kg/m   Intake/Output Summary (Last 24 hours) at 09/15/2017 1349 Last data filed at 09/15/2017 1200 Gross per 24 hour  Intake 3642.77 ml  Output 60 ml  Net 3582.77 ml   Intake/Output: I/O last 3 completed shifts: In: 4166 [P.O.:60; I.V.:8449; Blood:665; IV Piggyback:350] Out: 360 [Urine:360]  Intake/Output this shift:  Total I/O In: 472.2 [I.V.:472.2] Out: 0  Weight change: 13.9 kg (30 lb 10.3 oz) Gen: somnolent but arousable, tachypnic  AYT:KZSWF at 102, no rub Resp:decreased bs at bases, +end exp wheezes bilaterally bur R>L Abd: obese, tender to palpation, no guarding or rebound Ext: no edema  Recent Labs  Lab 09/10/17 0457 09/11/17 0357 08/30/2017 0455 09/13/17 0505 09/13/17 1532 09/13/17 2200 09/14/17 0530 09/14/17 1344 09/15/17 0500 09/15/17 1101  NA 142 141 143 144 147* 141 142 144 142 142  K 4.6 4.3 4.0 4.6 4.2 4.4 3.8 3.6 3.8 4.1  CL 115* 116* 117* 119* 124* 116* 112* 111 106 104  CO2 19* 18* 18* 16* 15* 10* 18* 21* 24 24  GLUCOSE 91 105* 86 110* 125* 226* 218* 140* 113* 104*  BUN 39* 35* 33* 39* 44* 41* 42* 45* 49* 51*  CREATININE 2.63* 2.61* 2.45* 3.06* 3.35* 3.60* 3.56* 3.97* 4.54* 5.00*  ALBUMIN 2.2* 2.1* 2.0* 1.9* 1.5*  --  1.7*  --  1.8*  --   CALCIUM 8.9 8.5* 9.0 8.6* 7.4* 6.8* 7.0* 6.9* 6.6* 6.6*  PHOS  --   --   --   --   --   --  4.7*  --  6.0*  --   AST 77* 88* 112* 103* 109*  --  3,689*  --  5,328*  --   ALT 92* 84* 92* 86* 76*  --  1,645*  --  2,228*  --    Liver Function Tests: Recent Labs  Lab 09/13/17 1532 09/14/17 0530 09/15/17 0500  AST 109* 3,689* 5,328*  ALT 76* 1,645* 2,228*  ALKPHOS 149* 189* 320*   BILITOT 11.2* 14.7* 16.5*  PROT 4.1* 4.6* 4.5*  ALBUMIN 1.5* 1.7* 1.8*   Recent Labs  Lab 09/09/17 0102  LIPASE 23   No results for input(s): AMMONIA in the last 168 hours. CBC: Recent Labs  Lab 09/14/17 0200 09/14/17 0530 09/14/17 1344 09/15/17 0500 09/15/17 1101  WBC 25.0* 25.5* 22.5* 27.1* 28.4*  NEUTROABS  --  23.0* 19.2*  --  25.8*  HGB 8.6* 9.0* 7.7* 7.8* 7.9*  HCT 24.6* 25.8* 21.6* 21.5* 21.7*  MCV 82.0 81.1 79.4 78.5 78.9  PLT 214 225 171 153 156   Cardiac Enzymes: Recent Labs  Lab 09/13/17 2220 09/14/17 0530  TROPONINI 0.05* 0.10*   CBG: Recent Labs  Lab 09/14/17 0550 09/14/17 1708 09/14/17 2334 09/15/17 0620 09/15/17 1159  GLUCAP 206* 126* 110* 104* 95    Iron Studies: No results for input(s): IRON, TIBC, TRANSFERRIN, FERRITIN in the last 72 hours. Studies/Results: Ct Abdomen Pelvis Wo Contrast  Result Date: 09/13/2017 CLINICAL DATA:  S/p liver bx today. hgb dropped, blood pressure dropped. Right sided  pain EXAM: CT ABDOMEN AND PELVIS WITHOUT CONTRAST TECHNIQUE: Multidetector CT imaging of the abdomen and pelvis was performed following the standard protocol without IV contrast. COMPARISON:  MRI of the liver, MRCP, 09/10/2017 FINDINGS: Lower chest: Status post right mastectomy. Small right minimal left pleural effusions with associated mild dependent lower lobe atelectasis. Heart mildly enlarged. Hepatobiliary: There is complex fluid surrounding the liver consistent with hemorrhage. Bubbles of air are seen anterior to the gallbladder, within the right lobe mass/lesion. Dense contrast is seen within the dependent portions of the dilated intrahepatic bile ducts with a small amount of contrast noted in a normal caliber common bile duct. Multiple dependent gallstones are noted. Pancreas: No pancreatic masses. There is mild peripancreatic stranding, new since prior MRI. Spleen: Normal in size.  No mass or focal lesion. Adrenals/Urinary Tract: No adrenal masses.  Bilateral renal cortical thinning. No renal masses. No stones. No hydronephrosis. Ureters are normal course and in caliber. Bladder is decompressed with a Foley catheter. Stomach/Bowel: Stomach and small bowel unremarkable. Colon is normal in caliber. There are multiple left colon diverticula. No diverticulitis or other colonic inflammatory process. Normal appendix visualized. Vascular/Lymphatic: No discrete enlarged lymph nodes. There is aortic atherosclerosis. No aneurysm. Reproductive: Uterus and bilateral adnexa are unremarkable. Other: Hemoperitoneum. Complex fluid with areas of higher attenuation surrounds the liver, most evident anteriorly and laterally, extending inferiorly along the right pericolic gutter and anterior pararenal space into the pelvis. Lower attenuation fluid is seen adjacent to the spleen and along the left pericolic gutter. Musculoskeletal: No fracture or acute finding. No osteoblastic or osteolytic lesions. IMPRESSION: 1. Hemoperitoneum, most evident adjacent to the liver, which is presumably the source of bleeding, subsequent to the liver biopsy performed today. This is new since the prior MRI. 2. Small amount of air is seen within the right lobe mass adjacent to the gallbladder, presumed secondary to the liver biopsy. 3. Hazy inflammatory type changes now noted along the pancreas which is likely secondary to the liver hemorrhage. Electronically Signed   By: Lajean Manes M.D.   On: 09/13/2017 17:36   Ir Angiogram Visceral Selective  Result Date: 09/14/2017 INDICATION: Acute perihepatic hematoma, status post liver lesion core biopsy EXAM: Ultrasound guidance for vascular access Celiac and peripheral right hepatic artery catheterizations and angiograms. Peripheral right hepatic artery branch micro coil embolization for pseudoaneurysms and active bleeding. MEDICATIONS: 1% lidocaine local. ANESTHESIA/SEDATION: Moderate Sedation Time: None. The patient's level of consciousness and vital  signs were monitored continuously by radiology nursing throughout the procedure under my direct supervision. CONTRAST:  200 cc Isovue-300 FLUOROSCOPY TIME:  Fluoroscopy Time: 17 minutes 48 seconds (3,604 mGy). COMPLICATIONS: None immediate. PROCEDURE: Informed consent was obtained from the patient following explanation of the procedure, risks, benefits and alternatives. The patient understands, agrees and consents for the procedure. All questions were addressed. A time out was performed prior to the initiation of the procedure. Maximal barrier sterile technique utilized including caps, mask, sterile gowns, sterile gloves, large sterile drape, hand hygiene, and Betadine prep. Under sterile conditions and local anesthesia, ultrasound micropuncture access performed of the right common femoral artery. Five French sheath inserted. Initially a C2 catheter was advanced. Because of angulation C2 catheter would not select the celiac origin. Catheter exchanged for a Mickelson catheter. Mickelson catheter reformed in the aorta and utilized to eventually select the celiac origin. Selective celiac angiogram performed. Celiac: Celiac origin is patent. Vessels demonstrate diffuse vaso constriction secondary to the acute shock. The splenic, and hepatic branches are patent. Peripheral  right hepatic artery active bleeding sites demonstrated within the liver compatible with areas of pseudoaneurysms. Mickelson catheter was directed to the hepatic vasculature. Through the access, a Renegade STC microcatheter was advanced. Catheter was advanced into the right hepatic peripheral branches. Additional angiograms performed. A mid right hepatic branch was localized as the area of active bleeding. Microcatheter was advanced into the specific branch. Additional selective angiograms performed. Eventually, the microcatheter was advanced peripherally within a right hepatic branch close to the 2 areas of active bleeding/pseudoaneurysms. 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 Active bleeding is demonstrated into the perihepatic hematoma. No biliary communication. Micro coil embolization: For embolization of the bleeding site, 4 3 mm interlock micro coils were deployed within a mid peripheral right hepatic branch. Post embolization angiogram demonstrates no further active bleeding. Catheter was retracted. Additional right hepatic angiograms also confirm no further active bleeding. Microcatheter removed. Final celiac angiogram reconfirms no further active bleeding. Access removed. Hemostasis obtained with an ExoSeal. Patient tolerated procedure well. No immediate complication. 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 Post embolization angiogram confirms arterial occlusion of this branch and no further active bleeding. Electronically Signed   By: Jerilynn Mages.  Shick M.D.   On: 09/14/2017 11:01   Ir Angiogram Selective Each Additional Vessel  Result Date: 09/14/2017 INDICATION: Acute perihepatic hematoma, status post liver lesion core biopsy EXAM: Ultrasound guidance for vascular access Celiac and peripheral right hepatic artery catheterizations and angiograms. Peripheral right hepatic artery branch micro coil embolization for pseudoaneurysms and active bleeding. MEDICATIONS: 1% lidocaine local. ANESTHESIA/SEDATION: Moderate Sedation Time: None. The patient's level of consciousness and vital signs were monitored continuously by radiology nursing throughout the procedure under my direct supervision. CONTRAST:  200 cc Isovue-300 FLUOROSCOPY TIME:  Fluoroscopy Time: 17 minutes 48 seconds (3,604 mGy). COMPLICATIONS: None immediate. PROCEDURE: Informed consent was obtained from the patient following explanation of the  procedure, risks, benefits and alternatives. The patient understands, agrees and consents for the procedure. All questions were addressed. A time out was performed prior to the initiation of the procedure. Maximal barrier sterile technique utilized including caps, mask, sterile gowns, sterile gloves, large sterile drape, hand hygiene, and Betadine prep. Under sterile conditions and local anesthesia, ultrasound micropuncture access performed of the right common femoral artery. Five French sheath inserted. Initially a C2 catheter was advanced. Because of angulation C2 catheter would not select the celiac origin. Catheter exchanged for a Mickelson catheter. Mickelson catheter reformed in the aorta and utilized to eventually select the celiac origin. Selective celiac angiogram performed. Celiac: Celiac origin is patent. Vessels demonstrate diffuse vaso constriction secondary to the acute shock. The splenic, and hepatic branches are patent. Peripheral right hepatic artery active bleeding sites demonstrated within the liver compatible with areas of pseudoaneurysms. Mickelson catheter was directed to the hepatic vasculature. Through the access, a Renegade STC microcatheter was advanced. Catheter was advanced into the right hepatic peripheral branches. Additional angiograms performed. A mid right hepatic branch was localized as the area of active bleeding. Microcatheter was advanced into the specific branch. Additional selective angiograms performed. Eventually, the microcatheter was advanced peripherally within a right hepatic branch close to the 2 areas of active bleeding/pseudoaneurysms. 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 Active bleeding is demonstrated into the perihepatic  hematoma. No biliary communication. Micro coil embolization: For embolization of the bleeding site, 4 3 mm interlock micro coils were deployed within a mid peripheral right hepatic branch.  Post embolization angiogram demonstrates no further active bleeding. Catheter was retracted. Additional right hepatic angiograms also confirm no further active bleeding. Microcatheter removed. Final celiac angiogram reconfirms no further active bleeding. Access removed. Hemostasis obtained with an ExoSeal. Patient tolerated procedure well. No immediate complication. 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 Post embolization angiogram confirms arterial occlusion of this branch and no further active bleeding. Electronically Signed   By: Jerilynn Mages.  Shick M.D.   On: 09/14/2017 11:01   Ir Angiogram Selective Each Additional Vessel  Result Date: 09/14/2017 INDICATION: Acute perihepatic hematoma, status post liver lesion core biopsy EXAM: Ultrasound guidance for vascular access Celiac and peripheral right hepatic artery catheterizations and angiograms. Peripheral right hepatic artery branch micro coil embolization for pseudoaneurysms and active bleeding. MEDICATIONS: 1% lidocaine local. ANESTHESIA/SEDATION: Moderate Sedation Time: None. The patient's level of consciousness and vital signs were monitored continuously by radiology nursing throughout the procedure under my direct supervision. CONTRAST:  200 cc Isovue-300 FLUOROSCOPY TIME:  Fluoroscopy Time: 17 minutes 48 seconds (3,604 mGy). COMPLICATIONS: None immediate. PROCEDURE: Informed consent was obtained from the patient following explanation of the procedure, risks, benefits and alternatives. The patient understands, agrees and consents for the procedure. All questions were addressed. A time out was performed prior to the initiation of the procedure. Maximal barrier sterile technique utilized including caps, mask, sterile gowns, sterile gloves, large sterile drape, hand hygiene,  and Betadine prep. Under sterile conditions and local anesthesia, ultrasound micropuncture access performed of the right common femoral artery. Five French sheath inserted. Initially a C2 catheter was advanced. Because of angulation C2 catheter would not select the celiac origin. Catheter exchanged for a Mickelson catheter. Mickelson catheter reformed in the aorta and utilized to eventually select the celiac origin. Selective celiac angiogram performed. Celiac: Celiac origin is patent. Vessels demonstrate diffuse vaso constriction secondary to the acute shock. The splenic, and hepatic branches are patent. Peripheral right hepatic artery active bleeding sites demonstrated within the liver compatible with areas of pseudoaneurysms. Mickelson catheter was directed to the hepatic vasculature. Through the access, a Renegade STC microcatheter was advanced. Catheter was advanced into the right hepatic peripheral branches. Additional angiograms performed. A mid right hepatic branch was localized as the area of active bleeding. Microcatheter was advanced into the specific branch. Additional selective angiograms performed. Eventually, the microcatheter was advanced peripherally within a right hepatic branch close to the 2 areas of active bleeding/pseudoaneurysms. 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 Active bleeding is demonstrated into the perihepatic hematoma. No biliary communication. Micro coil embolization: For embolization of the bleeding site, 4 3 mm interlock micro coils were deployed within a mid peripheral right hepatic branch. Post embolization angiogram demonstrates no further active bleeding. Catheter was retracted. Additional right hepatic angiograms also confirm no further active bleeding. Microcatheter removed. Final celiac angiogram reconfirms no further active bleeding. Access removed. Hemostasis obtained with an ExoSeal. Patient tolerated procedure  well. No immediate complication. 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 Post  embolization angiogram confirms arterial occlusion of this branch and no further active bleeding. Electronically Signed   By: Jerilynn Mages.  Shick M.D.   On: 09/14/2017 11:01   Ir Angiogram Selective Each Additional Vessel  Result Date: 09/14/2017 INDICATION: Acute perihepatic hematoma, status post liver lesion core biopsy EXAM: Ultrasound guidance for vascular access Celiac and peripheral right hepatic artery catheterizations and angiograms. Peripheral right hepatic artery branch micro coil embolization for pseudoaneurysms and active bleeding. MEDICATIONS: 1% lidocaine local. ANESTHESIA/SEDATION: Moderate Sedation Time: None. The patient's level of consciousness and vital signs were monitored continuously by radiology nursing throughout the procedure under my direct supervision. CONTRAST:  200 cc Isovue-300 FLUOROSCOPY TIME:  Fluoroscopy Time: 17 minutes 48 seconds (3,604 mGy). COMPLICATIONS: None immediate. PROCEDURE: Informed consent was obtained from the patient following explanation of the procedure, risks, benefits and alternatives. The patient understands, agrees and consents for the procedure. All questions were addressed. A time out was performed prior to the initiation of the procedure. Maximal barrier sterile technique utilized including caps, mask, sterile gowns, sterile gloves, large sterile drape, hand hygiene, and Betadine prep. Under sterile conditions and local anesthesia, ultrasound micropuncture access performed of the right common femoral artery. Five French sheath inserted. Initially a C2 catheter was advanced. Because of angulation C2 catheter would not select the celiac origin. Catheter exchanged for a Mickelson catheter. Mickelson  catheter reformed in the aorta and utilized to eventually select the celiac origin. Selective celiac angiogram performed. Celiac: Celiac origin is patent. Vessels demonstrate diffuse vaso constriction secondary to the acute shock. The splenic, and hepatic branches are patent. Peripheral right hepatic artery active bleeding sites demonstrated within the liver compatible with areas of pseudoaneurysms. Mickelson catheter was directed to the hepatic vasculature. Through the access, a Renegade STC microcatheter was advanced. Catheter was advanced into the right hepatic peripheral branches. Additional angiograms performed. A mid right hepatic branch was localized as the area of active bleeding. Microcatheter was advanced into the specific branch. Additional selective angiograms performed. Eventually, the microcatheter was advanced peripherally within a right hepatic branch close to the 2 areas of active bleeding/pseudoaneurysms. 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 Active bleeding is demonstrated into the perihepatic hematoma. No biliary communication. Micro coil embolization: For embolization of the bleeding site, 4 3 mm interlock micro coils were deployed within a mid peripheral right hepatic branch. Post embolization angiogram demonstrates no further active bleeding. Catheter was retracted. Additional right hepatic angiograms also confirm no further active bleeding. Microcatheter removed. Final celiac angiogram reconfirms no further active bleeding. Access removed. Hemostasis obtained with an ExoSeal. Patient tolerated procedure well. No immediate complication. 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 Post embolization angiogram confirms arterial occlusion of this  branch and no further active bleeding. Electronically Signed   By: Jerilynn Mages.  Shick M.D.   On: 09/14/2017 11:01   Ir US Guide Vasc Access Right  Result Date: 09/14/2017 INDICATION: Acute perihepatic hematoma, status post liver lesion core biopsy EXAM: Ultrasound guidance for vascular access Celiac and peripheral right hepatic artery catheterizations and angiograms. Peripheral right hepatic artery branch micro coil embolization for pseudoaneurysms and active bleeding. MEDICATIONS: 1% lidocaine local. ANESTHESIA/SEDATION: Moderate Sedation Time: None. The patient's level of consciousness and vital signs were monitored continuously by radiology nursing throughout the procedure under my direct supervision. CONTRAST:  200 cc Isovue-300 FLUOROSCOPY TIME:  Fluoroscopy Time: 17 minutes 48 seconds (3,604 mGy). COMPLICATIONS: None immediate. PROCEDURE: Informed consent was obtained from the patient following explanation of the procedure, risks, benefits and alternatives. The patient understands, agrees and consents for the procedure. All questions were addressed. A time out was performed prior to the initiation of the procedure. Maximal barrier sterile technique utilized including caps, mask, sterile gowns, sterile gloves, large sterile drape, hand hygiene, and Betadine prep. Under sterile conditions and local anesthesia, ultrasound micropuncture access performed of the right common femoral artery. Five French sheath inserted. Initially a C2 catheter was advanced. Because of angulation C2 catheter would not select the celiac origin. Catheter exchanged for a Mickelson catheter. Mickelson catheter reformed in the aorta and utilized to eventually select the celiac origin. Selective celiac angiogram performed. Celiac: Celiac origin is patent. Vessels demonstrate diffuse vaso constriction secondary to the acute shock. The splenic, and hepatic branches are patent. Peripheral right hepatic artery active bleeding sites demonstrated  within the liver compatible with areas of pseudoaneurysms. Mickelson catheter was directed to the hepatic vasculature. Through the access, a Renegade STC microcatheter was advanced. Catheter was advanced into the right hepatic peripheral branches. Additional angiograms performed. A mid right hepatic branch was localized as the area of active bleeding. Microcatheter was advanced into the specific branch. Additional selective angiograms performed. Eventually, the microcatheter was advanced peripherally within a right hepatic branch close to the 2 areas of active bleeding/pseudoaneurysms. 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 Active bleeding is demonstrated into the perihepatic hematoma. No biliary communication. Micro coil embolization: For embolization of the bleeding site, 4 3 mm interlock micro coils were deployed within a mid peripheral right hepatic branch. Post embolization angiogram demonstrates no further active bleeding. Catheter was retracted. Additional right hepatic angiograms also confirm no further active bleeding. Microcatheter removed. Final celiac angiogram reconfirms no further active bleeding. Access removed. Hemostasis obtained with an ExoSeal. Patient tolerated procedure well. No immediate complication. 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 Post embolization angiogram confirms arterial occlusion of this branch and no further active bleeding. Electronically Signed   By: Jerilynn Mages.  Shick M.D.   On: 09/14/2017 11:01   Ir US Guide Bx Asp/drain  Result Date: 09/14/2017 INDICATION: Large 9 cm mass of the right lobe of the liver causing biliary obstruction and invading the gallbladder. The patient presents for biopsy of the mass as well as percutaneous  cholangiogram and biliary drainage due to inability to cannulate the common bile duct by ERCP due to biliary stricture. EXAM: 1. ULTRASOUND CORE BIOPSY OF HEPATIC MASS 2. PERCUTANEOUS TRANSHEPATIC CHOLANGIOGRAM WITH ULTRASOUND GUIDANCE FOR BILIARY ACCESS MEDICATIONS: 3.375 g IV Zosyn; The antibiotic was administered within an appropriate time frame prior to the initiation of the procedure. ANESTHESIA/SEDATION: Moderate (conscious) sedation was employed during this procedure. A total of Versed 4.0 mg and Fentanyl 200 mcg was administered intravenously. Moderate Sedation Time: 84 minutes. The patient's level of consciousness and vital signs were monitored continuously by radiology nursing throughout the procedure under my direct supervision. FLUOROSCOPY TIME:  Fluoroscopy Time: 5 minutes and 18 seconds. 255 mGy. COMPLICATIONS: A few hours following the procedure, it became evident that there was likely a bleeding complication due to hypotension and drop in hemoglobin. CT confirmed the presence of a large  subcapsular hemorrhage surrounding the liver. Subsequent emergent hepatic arteriography and embolization was performed to treat bleeding. SIR LEVEL C - Requires therapy, minor hospitalization (<48 hrs). PROCEDURE: Informed written consent was obtained from the patient after a thorough discussion of the procedural risks, benefits and alternatives. All questions were addressed. Maximal Sterile Barrier Technique was utilized including caps, mask, sterile gowns, sterile gloves, sterile drape, hand hygiene and skin antiseptic. Chlorhexidine was used for skin prep. Local anesthesia was provided with 1% lidocaine. A timeout was performed prior to the initiation of the procedure. Ultrasound was used to initially localize a right lobe hepatic mass. Under ultrasound guidance, a 17 gauge needle was advanced into the hepatic mass. A total of 3 separate coaxial 18 gauge core biopsy samples were obtained of the mass and samples  submitted in formalin. Following the biopsy, Gel-Foam pledgets were injected through the outer needle as the needle was retracted. Ultrasound was performed of the right lobe of the liver. Under ultrasound guidance, a 21 gauge needle was advanced into a right lobe bile duct. Contrast was injected through the needle and a cholangiogram performed with several fluoroscopic images obtained. A guidewire was then advanced through the needle under fluoroscopy. Additional right lobe biliary access was performed with the 21 gauge needle. Further contrast injection was performed. Guidewire advancement was again performed. Further attempts were also made to access bile ducts slightly more inferiorly in the right lobe. FINDINGS: Ultrasound demonstrates a lobulated, ill-defined hypoechoic mass within the inferior right lobe extending into the porta hepatis. Solid tissue was obtained from the hepatic mass. Bile ducts in the right lobe are dilated superior to the mass and biliary anatomy is distorted due to mass effect from the underlying hepatic mass. After access of a dilated bile duct, contrast injection shows diffuse dilatation of right lobe ducts. Guidewire advancement was difficult and only either extended for a short distance or in a retrograde fashion more peripherally in the liver. Central guidewire access could not be obtained. Due to difficulty in obtaining biliary access and guidewire advancement as well as length of the procedure, decision was made to abort attempts at biliary drain placement today. Re-attempt will be made on a later date. IMPRESSION: 1. Ultrasound-guided core biopsy performed of a large right lobe hepatic mass. 2. Percutaneous transhepatic cholangiogram demonstrates diffusely dilated right lobe ducts superior to the mass. Despite ability to cannulate right lobe bile ducts, guidewire advancement was unsuccessful centrally and a biliary drainage catheter could not be placed today. Re-attempt will be  made on a later date via either right lobe or left lobe biliary access. 3. Several hours after the procedure, it became evident that there was a bleeding complication with subcapsular hemorrhage present adjacent to the liver. This did lead to an emergent arteriogram and embolization procedure which will be dictated separately. Electronically Signed   By: Aletta Edouard M.D.   On: 09/14/2017 09:23   Dg Chest Port 1 View  Result Date: 09/15/2017 CLINICAL DATA:  F/u SOB EXAM: PORTABLE CHEST 1 VIEW COMPARISON:  Chest x-ray 09/13/2017 FINDINGS: Left-sided subclavian central line tip overlies the level of superior vena cava. Shallow lung inflation. The heart is enlarged. There is increasing opacity in the left lung base, now completely obscuring the left hemidiaphragm. Suspect left pleural effusion. There are interstitial changes compatible with mild edema surgically well seen in the right lung base. Significant atherosclerotic calcification. IMPRESSION: 1. Increased left lower lobe atelectasis or infiltrate and pleural effusion. 2. Interstitial edema. 3.  Aortic atherosclerosis.  (  ICD10-I70.0) Electronically Signed   By: Nolon Nations M.D.   On: 09/15/2017 09:07   Dg Chest Port 1 View  Result Date: 09/13/2017 CLINICAL DATA:  Central line placement. EXAM: PORTABLE CHEST 1 VIEW COMPARISON:  09/09/2017 FINDINGS: New left subclavian central venous line has its tip projecting just above the caval atrial junction. No pneumothorax. Cardiac silhouette is normal in size. No mediastinal or hilar masses. Clear lungs. IMPRESSION: 1. New left subclavian central venous line is well positioned, tip projecting just above the caval atrial junction. 2. No pneumothorax.  No other change from the prior study. Electronically Signed   By: Lajean Manes M.D.   On: 09/13/2017 16:42   Ir Percutaneous Transhepatic Cholangiogram  Result Date: 09/14/2017 INDICATION: Large 9 cm mass of the right lobe of the liver causing biliary  obstruction and invading the gallbladder. The patient presents for biopsy of the mass as well as percutaneous cholangiogram and biliary drainage due to inability to cannulate the common bile duct by ERCP due to biliary stricture. EXAM: 1. ULTRASOUND CORE BIOPSY OF HEPATIC MASS 2. PERCUTANEOUS TRANSHEPATIC CHOLANGIOGRAM WITH ULTRASOUND GUIDANCE FOR BILIARY ACCESS MEDICATIONS: 3.375 g IV Zosyn; The antibiotic was administered within an appropriate time frame prior to the initiation of the procedure. ANESTHESIA/SEDATION: Moderate (conscious) sedation was employed during this procedure. A total of Versed 4.0 mg and Fentanyl 200 mcg was administered intravenously. Moderate Sedation Time: 84 minutes. The patient's level of consciousness and vital signs were monitored continuously by radiology nursing throughout the procedure under my direct supervision. FLUOROSCOPY TIME:  Fluoroscopy Time: 5 minutes and 18 seconds. 255 mGy. COMPLICATIONS: A few hours following the procedure, it became evident that there was likely a bleeding complication due to hypotension and drop in hemoglobin. CT confirmed the presence of a large subcapsular hemorrhage surrounding the liver. Subsequent emergent hepatic arteriography and embolization was performed to treat bleeding. SIR LEVEL C - Requires therapy, minor hospitalization (<48 hrs). PROCEDURE: Informed written consent was obtained from the patient after a thorough discussion of the procedural risks, benefits and alternatives. All questions were addressed. Maximal Sterile Barrier Technique was utilized including caps, mask, sterile gowns, sterile gloves, sterile drape, hand hygiene and skin antiseptic. Chlorhexidine was used for skin prep. Local anesthesia was provided with 1% lidocaine. A timeout was performed prior to the initiation of the procedure. Ultrasound was used to initially localize a right lobe hepatic mass. Under ultrasound guidance, a 17 gauge needle was advanced into the  hepatic mass. A total of 3 separate coaxial 18 gauge core biopsy samples were obtained of the mass and samples submitted in formalin. Following the biopsy, Gel-Foam pledgets were injected through the outer needle as the needle was retracted. Ultrasound was performed of the right lobe of the liver. Under ultrasound guidance, a 21 gauge needle was advanced into a right lobe bile duct. Contrast was injected through the needle and a cholangiogram performed with several fluoroscopic images obtained. A guidewire was then advanced through the needle under fluoroscopy. Additional right lobe biliary access was performed with the 21 gauge needle. Further contrast injection was performed. Guidewire advancement was again performed. Further attempts were also made to access bile ducts slightly more inferiorly in the right lobe. FINDINGS: Ultrasound demonstrates a lobulated, ill-defined hypoechoic mass within the inferior right lobe extending into the porta hepatis. Solid tissue was obtained from the hepatic mass. Bile ducts in the right lobe are dilated superior to the mass and biliary anatomy is distorted due to mass effect from the  underlying hepatic mass. After access of a dilated bile duct, contrast injection shows diffuse dilatation of right lobe ducts. Guidewire advancement was difficult and only either extended for a short distance or in a retrograde fashion more peripherally in the liver. Central guidewire access could not be obtained. Due to difficulty in obtaining biliary access and guidewire advancement as well as length of the procedure, decision was made to abort attempts at biliary drain placement today. Re-attempt will be made on a later date. IMPRESSION: 1. Ultrasound-guided core biopsy performed of a large right lobe hepatic mass. 2. Percutaneous transhepatic cholangiogram demonstrates diffusely dilated right lobe ducts superior to the mass. Despite ability to cannulate right lobe bile ducts, guidewire  advancement was unsuccessful centrally and a biliary drainage catheter could not be placed today. Re-attempt will be made on a later date via either right lobe or left lobe biliary access. 3. Several hours after the procedure, it became evident that there was a bleeding complication with subcapsular hemorrhage present adjacent to the liver. This did lead to an emergent arteriogram and embolization procedure which will be dictated separately. Electronically Signed   By: Aletta Edouard M.D.   On: 09/14/2017 09:23   [place] Guide Roadmapping  Result Date: 09/14/2017 INDICATION: Acute perihepatic hematoma, status post liver lesion core biopsy EXAM: Ultrasound guidance for vascular access Celiac and peripheral right hepatic artery catheterizations and angiograms. Peripheral right hepatic artery branch micro coil embolization for pseudoaneurysms and active bleeding. MEDICATIONS: 1% lidocaine local. ANESTHESIA/SEDATION: Moderate Sedation Time: None. The patient's level of consciousness and vital signs were monitored continuously by radiology nursing throughout the procedure under my direct supervision. CONTRAST:  200 cc Isovue-300 FLUOROSCOPY TIME:  Fluoroscopy Time: 17 minutes 48 seconds (3,604 mGy). COMPLICATIONS: None immediate. PROCEDURE: Informed consent was obtained from the patient following explanation of the procedure, risks, benefits and alternatives. The patient understands, agrees and consents for the procedure. All questions were addressed. A time out was performed prior to the initiation of the procedure. Maximal barrier sterile technique utilized including caps, mask, sterile gowns, sterile gloves, large sterile drape, hand hygiene, and Betadine prep. Under sterile conditions and local anesthesia, ultrasound micropuncture access performed of the right common femoral artery. Five French sheath inserted. Initially a C2 catheter was advanced. Because of angulation C2  catheter would not select the celiac origin. Catheter exchanged for a Mickelson catheter. Mickelson catheter reformed in the aorta and utilized to eventually select the celiac origin. Selective celiac angiogram performed. Celiac: Celiac origin is patent. Vessels demonstrate diffuse vaso constriction secondary to the acute shock. The splenic, and hepatic branches are patent. Peripheral right hepatic artery active bleeding sites demonstrated within the liver compatible with areas of pseudoaneurysms. Mickelson catheter was directed to the hepatic vasculature. Through the access, a Renegade STC microcatheter was advanced. Catheter was advanced into the right hepatic peripheral branches. Additional angiograms performed. A mid right hepatic branch was localized as the area of active bleeding. Microcatheter was advanced into the specific branch. Additional selective angiograms performed. Eventually, the microcatheter was advanced peripherally within a right hepatic branch close to the 2 areas of active bleeding/pseudoaneurysms. 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 Active bleeding is demonstrated into the perihepatic hematoma. No biliary communication. Micro coil embolization: For embolization of the bleeding site, 4 3 mm interlock micro coils were deployed within a mid peripheral right hepatic branch. Post embolization angiogram demonstrates no further active  bleeding. Catheter was retracted. Additional right hepatic angiograms also confirm no further active bleeding. Microcatheter removed. Final celiac angiogram reconfirms no further active bleeding. Access removed. Hemostasis obtained with an ExoSeal. Patient tolerated procedure well. No immediate complication. 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 Post embolization angiogram confirms arterial occlusion of this branch and no further active bleeding. Electronically Signed   By: Jerilynn Mages.  Shick M.D.   On: 09/14/2017 11:01   . Chlorhexidine Gluconate Cloth  6 each Topical Daily  . levothyroxine  50 mcg Intravenous Daily  . mouth rinse  15 mL Mouth Rinse BID  . pantoprazole (PROTONIX) IV  40 mg Intravenous Q12H  . sodium chloride flush  10-40 mL Intracatheter Q12H    BMET    Component Value Date/Time   NA 142 09/15/2017 1101   K 4.1 09/15/2017 1101   CL 104 09/15/2017 1101   CO2 24 09/15/2017 1101   GLUCOSE 104 (H) 09/15/2017 1101   BUN 51 (H) 09/15/2017 1101   CREATININE 5.00 (H) 09/15/2017 1101   CALCIUM 6.6 (L) 09/15/2017 1101   GFRNONAA 8 (L) 09/15/2017 1101   GFRAA 9 (L) 09/15/2017 1101   CBC    Component Value Date/Time   WBC 28.4 (H) 09/15/2017 1101   RBC 2.75 (L) 09/15/2017 1101   HGB 7.9 (L) 09/15/2017 1101   HCT 21.7 (L) 09/15/2017 1101   PLT 156 09/15/2017 1101   MCV 78.9 09/15/2017 1101   MCH 28.7 09/15/2017 1101   MCHC 36.4 (H) 09/15/2017 1101   RDW 17.9 (H) 09/15/2017 1101   LYMPHSABS 0.9 09/15/2017 1101   MONOABS 1.7 (H) 09/15/2017 1101   EOSABS 0.0 09/15/2017 1101   BASOSABS 0.0 09/15/2017 1101     Assessment/Plan:  1. AKI/CKD- anuric requiring pressors due to hemorrhagic shock, and with increased SOB and work of breathing.  Discussed case with PCCM and re-evaluted and concur with her worsening respiratory status.  Will plan to initiate CVVHDF for volume management.  No heparin due to intra-abdominal bleed.  Stop IVF's and follow.  Discussed with family who are in agreement.   Donetta Potts, MD [employer] [phone]

## 2017-09-15 NOTE — Progress Notes (Signed)
[facility] Rounding Note  Subjective/Interval History:  Remains essentially anuric despite fluids (now >11 liters+) Denies SOB but looks to be working a little harder to breathe Denies abd pain at present  Objective Vital signs in last 24 hours: Vitals:   09/15/17 0430 09/15/17 0500 09/15/17 0530 09/15/17 0600  BP: (!) 117/42 (!) 100/34  (!) 104/36  Pulse: 87 80 85 81  Resp: (!) 32 (!) 22 (!) 21 20  Temp:      TempSrc:      SpO2: (!) 89% 92% (!) 89% 91%  Weight:      Height:       Weight change:   Intake/Output Summary (Last 24 hours) at 09/15/2017 0603 Last data filed at 09/15/2017 0600 Gross per 24 hour  Intake 4093.22 ml  Output 85 ml  Net 4008.22 ml   Physical Exam:  Blood pressure (!) 104/36, pulse 81, temperature (!) 97.4 F (36.3 C), temperature source Axillary, resp. rate 20, height 5\' 5"  (1.651 m), weight 111.3 kg (245 lb 6 oz), SpO2 91 %.  CVP 6 Awake, alert Some ^ WOB Lungs ant fairly clear though soft exp wheeze on the right Normal heart sounds S12 No S3 murmur or rub Abd only minimally tender in RUQ No LE edema  AM labs 1/31 still pending  Recent Labs  Lab 09/11/17 0357 09/11/2017 0455 09/13/17 0505 09/13/17 1532 09/13/17 2200 09/14/17 0530 09/14/17 1344  NA 141 143 144 147* 141 142 144  K 4.3 4.0 4.6 4.2 4.4 3.8 3.6  CL 116* 117* 119* 124* 116* 112* 111  CO2 18* 18* 16* 15* 10* 18* 21*  GLUCOSE 105* 86 110* 125* 226* 218* 140*  BUN 35* 33* 39* 44* 41* 42* 45*  CREATININE 2.61* 2.45* 3.06* 3.35* 3.60* 3.56* 3.97*  CALCIUM 8.5* 9.0 8.6* 7.4* 6.8* 7.0* 6.9*  PHOS  --   --   --   --   --  4.7*  --     Recent Labs  Lab 09/13/17 0505 09/13/17 1532 09/14/17 0530  AST 103* 109* 3,689*  ALT 86* 76* 1,645*  ALKPHOS 215* 149* 189*  BILITOT 15.0* 11.2* 14.7*  PROT 5.7* 4.1* 4.6*  ALBUMIN 1.9* 1.5* 1.7*   Recent Labs  Lab 09/09/17 0102  LIPASE 23    Recent Labs  Lab 09/05/2017 0455 09/13/17 0505  09/14/17 0200 09/14/17 0530 09/14/17 1344  09/15/17 0500  WBC 11.1* 9.8   < > 25.0* 25.5* 22.5* 27.1*  NEUTROABS 7.3 8.5*  --   --  23.0* 19.2*  --   HGB 8.0* 7.7*   < > 8.6* 9.0* 7.7* 7.8*  HCT 23.7* 22.5*   < > 24.6* 25.8* 21.6* 21.5*  MCV 81.7 79.5   < > 82.0 81.1 79.4 78.5  PLT 321 311   < > 214 225 171 153   < > = values in this interval not displayed.   Recent Labs  Lab 09/13/17 2220 09/14/17 0530  TROPONINI 0.05* 0.10*    Recent Labs  Lab 09/13/17 1423 09/13/17 2344 09/14/17 0550 09/14/17 1708 09/14/17 2334  GLUCAP 106* 211* 206* 126* 110*   ABG    Component Value Date/Time   PHART 7.277 (L) 09/14/2017 0244   PCO2ART 30.5 (L) 09/14/2017 0244   PO2ART 83.7 09/14/2017 0244   HCO3 22.6 09/14/2017 1928   ACIDBASEDEF 2.7 (H) 09/14/2017 1928   O2SAT 51.3 09/14/2017 1928   Studies/Results: Ct Abdomen Pelvis Wo Contrast  Result Date: 09/13/2017 CLINICAL DATA:  S/p liver  bx today. hgb dropped, blood pressure dropped. Right sided pain EXAM: CT ABDOMEN AND PELVIS WITHOUT CONTRAST TECHNIQUE: Multidetector CT imaging of the abdomen and pelvis was performed following the standard protocol without IV contrast. COMPARISON:  MRI of the liver, MRCP, 09/10/2017 FINDINGS: Lower chest: Status post right mastectomy. Small right minimal left pleural effusions with associated mild dependent lower lobe atelectasis. Heart mildly enlarged. Hepatobiliary: There is complex fluid surrounding the liver consistent with hemorrhage. Bubbles of air are seen anterior to the gallbladder, within the right lobe mass/lesion. Dense contrast is seen within the dependent portions of the dilated intrahepatic bile ducts with a small amount of contrast noted in a normal caliber common bile duct. Multiple dependent gallstones are noted. Pancreas: No pancreatic masses. There is mild peripancreatic stranding, new since prior MRI. Spleen: Normal in size.  No mass or focal lesion. Adrenals/Urinary Tract: No adrenal masses. Bilateral renal cortical thinning. No  renal masses. No stones. No hydronephrosis. Ureters are normal course and in caliber. Bladder is decompressed with a Foley catheter. Stomach/Bowel: Stomach and small bowel unremarkable. Colon is normal in caliber. There are multiple left colon diverticula. No diverticulitis or other colonic inflammatory process. Normal appendix visualized. Vascular/Lymphatic: No discrete enlarged lymph nodes. There is aortic atherosclerosis. No aneurysm. Reproductive: Uterus and bilateral adnexa are unremarkable. Other: Hemoperitoneum. Complex fluid with areas of higher attenuation surrounds the liver, most evident anteriorly and laterally, extending inferiorly along the right pericolic gutter and anterior pararenal space into the pelvis. Lower attenuation fluid is seen adjacent to the spleen and along the left pericolic gutter. Musculoskeletal: No fracture or acute finding. No osteoblastic or osteolytic lesions. IMPRESSION: 1. Hemoperitoneum, most evident adjacent to the liver, which is presumably the source of bleeding, subsequent to the liver biopsy performed today. This is new since the prior MRI. 2. Small amount of air is seen within the right lobe mass adjacent to the gallbladder, presumed secondary to the liver biopsy. 3. Hazy inflammatory type changes now noted along the pancreas which is likely secondary to the liver hemorrhage. Electronically Signed   By: Lajean Manes M.D.   On: 09/13/2017 17:36   Ir Angiogram Visceral Selective  Result Date: 09/14/2017 INDICATION: Acute perihepatic hematoma, status post liver lesion core biopsy EXAM: Ultrasound guidance for vascular access Celiac and peripheral right hepatic artery catheterizations and angiograms. Peripheral right hepatic artery branch micro coil embolization for pseudoaneurysms and active bleeding. MEDICATIONS: 1% lidocaine local. ANESTHESIA/SEDATION: Moderate Sedation Time: None. The patient's level of consciousness and vital signs were monitored continuously by  radiology nursing throughout the procedure under my direct supervision. CONTRAST:  200 cc Isovue-300 FLUOROSCOPY TIME:  Fluoroscopy Time: 17 minutes 48 seconds (3,604 mGy). COMPLICATIONS: None immediate. PROCEDURE: Informed consent was obtained from the patient following explanation of the procedure, risks, benefits and alternatives. The patient understands, agrees and consents for the procedure. All questions were addressed. A time out was performed prior to the initiation of the procedure. Maximal barrier sterile technique utilized including caps, mask, sterile gowns, sterile gloves, large sterile drape, hand hygiene, and Betadine prep. Under sterile conditions and local anesthesia, ultrasound micropuncture access performed of the right common femoral artery. Five French sheath inserted. Initially a C2 catheter was advanced. Because of angulation C2 catheter would not select the celiac origin. Catheter exchanged for a Mickelson catheter. Mickelson catheter reformed in the aorta and utilized to eventually select the celiac origin. Selective celiac angiogram performed. Celiac: Celiac origin is patent. Vessels demonstrate diffuse vaso constriction secondary to the acute  shock. The splenic, and hepatic branches are patent. Peripheral right hepatic artery active bleeding sites demonstrated within the liver compatible with areas of pseudoaneurysms. Mickelson catheter was directed to the hepatic vasculature. Through the access, a Renegade STC microcatheter was advanced. Catheter was advanced into the right hepatic peripheral branches. Additional angiograms performed. A mid right hepatic branch was localized as the area of active bleeding. Microcatheter was advanced into the specific branch. Additional selective angiograms performed. Eventually, the microcatheter was advanced peripherally within a right hepatic branch close to the 2 areas of active bleeding/pseudoaneurysms. 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 Active bleeding is demonstrated into the perihepatic hematoma. No biliary communication. Micro coil embolization: For embolization of the bleeding site, 4 3 mm interlock micro coils were deployed within a mid peripheral right hepatic branch. Post embolization angiogram demonstrates no further active bleeding. Catheter was retracted. Additional right hepatic angiograms also confirm no further active bleeding. Microcatheter removed. Final celiac angiogram reconfirms no further active bleeding. Access removed. Hemostasis obtained with an ExoSeal. Patient tolerated procedure well. No immediate complication. 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 Post embolization angiogram confirms arterial occlusion of this branch and no further active bleeding. Electronically Signed   By: Jerilynn Mages.  Shick M.D.   On: 09/14/2017 11:01   Ir Angiogram Selective Each Additional Vessel  Result Date: 09/14/2017 INDICATION: Acute perihepatic hematoma, status post liver lesion core biopsy EXAM: Ultrasound guidance for vascular access Celiac and peripheral right hepatic artery catheterizations and angiograms. Peripheral right hepatic artery branch micro coil embolization for pseudoaneurysms and active bleeding. MEDICATIONS: 1% lidocaine local. ANESTHESIA/SEDATION: Moderate Sedation Time: None. The patient's level of consciousness and vital signs were monitored continuously by radiology nursing throughout the procedure under my direct supervision. CONTRAST:  200 cc Isovue-300 FLUOROSCOPY TIME:  Fluoroscopy Time: 17 minutes 48 seconds (3,604 mGy). COMPLICATIONS: None immediate. PROCEDURE: Informed consent was obtained from the patient following explanation of the procedure, risks, benefits and alternatives.  The patient understands, agrees and consents for the procedure. All questions were addressed. A time out was performed prior to the initiation of the procedure. Maximal barrier sterile technique utilized including caps, mask, sterile gowns, sterile gloves, large sterile drape, hand hygiene, and Betadine prep. Under sterile conditions and local anesthesia, ultrasound micropuncture access performed of the right common femoral artery. Five French sheath inserted. Initially a C2 catheter was advanced. Because of angulation C2 catheter would not select the celiac origin. Catheter exchanged for a Mickelson catheter. Mickelson catheter reformed in the aorta and utilized to eventually select the celiac origin. Selective celiac angiogram performed. Celiac: Celiac origin is patent. Vessels demonstrate diffuse vaso constriction secondary to the acute shock. The splenic, and hepatic branches are patent. Peripheral right hepatic artery active bleeding sites demonstrated within the liver compatible with areas of pseudoaneurysms. Mickelson catheter was directed to the hepatic vasculature. Through the access, a Renegade STC microcatheter was advanced. Catheter was advanced into the right hepatic peripheral branches. Additional angiograms performed. A mid right hepatic branch was localized as the area of active bleeding. Microcatheter was advanced into the specific branch. Additional selective angiograms performed. Eventually, the microcatheter was advanced peripherally within a right hepatic branch close to the 2 areas of active bleeding/pseudoaneurysms. Contrast injection confirms active bleeding as well as traumatic AV fistula with the  portal vein. Active bleeding is demonstrated into the perihepatic hematoma. No biliary communication. Micro coil embolization: For embolization of the bleeding site, 4 3 mm interlock micro coils were deployed within a mid peripheral right hepatic branch. Post embolization angiogram demonstrates no  further active bleeding. Catheter was retracted. Additional right hepatic angiograms also confirm no further active bleeding. Microcatheter removed. Final celiac angiogram reconfirms no further active bleeding. Access removed. Hemostasis obtained with an ExoSeal. Patient tolerated procedure well. No immediate complication. 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 Post embolization angiogram confirms arterial occlusion of this branch and no further active bleeding. Electronically Signed   By: Jerilynn Mages.  Shick M.D.   On: 09/14/2017 11:01   Ir Angiogram Selective Each Additional Vessel  Result Date: 09/14/2017 INDICATION: Acute perihepatic hematoma, status post liver lesion core biopsy EXAM: Ultrasound guidance for vascular access Celiac and peripheral right hepatic artery catheterizations and angiograms. Peripheral right hepatic artery branch micro coil embolization for pseudoaneurysms and active bleeding. MEDICATIONS: 1% lidocaine local. ANESTHESIA/SEDATION: Moderate Sedation Time: None. The patient's level of consciousness and vital signs were monitored continuously by radiology nursing throughout the procedure under my direct supervision. CONTRAST:  200 cc Isovue-300 FLUOROSCOPY TIME:  Fluoroscopy Time: 17 minutes 48 seconds (3,604 mGy). COMPLICATIONS: None immediate. PROCEDURE: Informed consent was obtained from the patient following explanation of the procedure, risks, benefits and alternatives. The patient understands, agrees and consents for the procedure. All questions were addressed. A time out was performed prior to the initiation of the procedure. Maximal barrier sterile technique utilized including caps, mask, sterile gowns, sterile gloves, large sterile drape, hand hygiene, and Betadine prep. Under sterile conditions  and local anesthesia, ultrasound micropuncture access performed of the right common femoral artery. Five French sheath inserted. Initially a C2 catheter was advanced. Because of angulation C2 catheter would not select the celiac origin. Catheter exchanged for a Mickelson catheter. Mickelson catheter reformed in the aorta and utilized to eventually select the celiac origin. Selective celiac angiogram performed. Celiac: Celiac origin is patent. Vessels demonstrate diffuse vaso constriction secondary to the acute shock. The splenic, and hepatic branches are patent. Peripheral right hepatic artery active bleeding sites demonstrated within the liver compatible with areas of pseudoaneurysms. Mickelson catheter was directed to the hepatic vasculature. Through the access, a Renegade STC microcatheter was advanced. Catheter was advanced into the right hepatic peripheral branches. Additional angiograms performed. A mid right hepatic branch was localized as the area of active bleeding. Microcatheter was advanced into the specific branch. Additional selective angiograms performed. Eventually, the microcatheter was advanced peripherally within a right hepatic branch close to the 2 areas of active bleeding/pseudoaneurysms. 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 Active bleeding is demonstrated into the perihepatic hematoma. No biliary communication. Micro coil embolization: For embolization of the bleeding site, 4 3 mm interlock micro coils were deployed within a mid peripheral right hepatic branch. Post embolization angiogram demonstrates no further active bleeding. Catheter was retracted. Additional right hepatic angiograms also confirm no further active bleeding. Microcatheter removed. Final celiac angiogram reconfirms no further active bleeding. Access removed. Hemostasis obtained with an ExoSeal. Patient tolerated procedure well. No immediate complication. 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 Post embolization angiogram confirms arterial occlusion of this branch and no further active bleeding. Electronically Signed   By: Jerilynn Mages.  Shick M.D.   On: 09/14/2017 11:01   Ir Angiogram Selective Each Additional Vessel  Result Date: 09/14/2017 INDICATION: Acute perihepatic hematoma, status post liver lesion core biopsy EXAM: Ultrasound guidance for vascular access Celiac and peripheral right hepatic artery catheterizations and angiograms. Peripheral right hepatic artery branch micro coil embolization for pseudoaneurysms and active bleeding. MEDICATIONS: 1% lidocaine local. ANESTHESIA/SEDATION: Moderate Sedation Time: None. The patient's level of consciousness and vital signs were monitored continuously by radiology nursing throughout the procedure under my direct supervision. CONTRAST:  200 cc Isovue-300 FLUOROSCOPY TIME:  Fluoroscopy Time: 17 minutes 48 seconds (3,604 mGy). COMPLICATIONS: None immediate. PROCEDURE: Informed consent was obtained from the patient following explanation of the procedure, risks, benefits and alternatives. The patient understands, agrees and consents for the procedure. All questions were addressed. A time out was performed prior to the initiation of the procedure. Maximal barrier sterile technique utilized including caps, mask, sterile gowns, sterile gloves, large sterile drape, hand hygiene, and Betadine prep. Under sterile conditions and local anesthesia, ultrasound micropuncture access performed of the right common femoral artery. Five French sheath inserted. Initially a C2 catheter was advanced. Because of angulation C2 catheter would not select the celiac origin. Catheter exchanged for a Mickelson catheter. Mickelson catheter reformed in the aorta and utilized  to eventually select the celiac origin. Selective celiac angiogram performed. Celiac: Celiac origin is patent. Vessels demonstrate diffuse vaso constriction secondary to the acute shock. The splenic, and hepatic branches are patent. Peripheral right hepatic artery active bleeding sites demonstrated within the liver compatible with areas of pseudoaneurysms. Mickelson catheter was directed to the hepatic vasculature. Through the access, a Renegade STC microcatheter was advanced. Catheter was advanced into the right hepatic peripheral branches. Additional angiograms performed. A mid right hepatic branch was localized as the area of active bleeding. Microcatheter was advanced into the specific branch. Additional selective angiograms performed. Eventually, the microcatheter was advanced peripherally within a right hepatic branch close to the 2 areas of active bleeding/pseudoaneurysms. 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 Active bleeding is demonstrated into the perihepatic hematoma. No biliary communication. Micro coil embolization: For embolization of the bleeding site, 4 3 mm interlock micro coils were deployed within a mid peripheral right hepatic branch. Post embolization angiogram demonstrates no further active bleeding. Catheter was retracted. Additional right hepatic angiograms also confirm no further active bleeding. Microcatheter removed. Final celiac angiogram reconfirms no further active bleeding. Access removed. Hemostasis obtained with an ExoSeal. Patient tolerated procedure well. No immediate complication. 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 Post embolization angiogram confirms arterial occlusion of this branch and no further active bleeding.  Electronically Signed   By: Jerilynn Mages.  Shick M.D.   On: 09/14/2017 11:01   Ir US Guide Vasc Access Right  Result Date: 09/14/2017 INDICATION: Acute perihepatic hematoma, status post liver lesion core biopsy EXAM: Ultrasound guidance for vascular access Celiac and peripheral right hepatic artery catheterizations and angiograms. Peripheral right hepatic artery branch micro coil embolization for pseudoaneurysms and active bleeding. MEDICATIONS: 1% lidocaine local. ANESTHESIA/SEDATION: Moderate Sedation Time: None. The patient's level of consciousness and vital signs were monitored continuously by radiology nursing  throughout the procedure under my direct supervision. CONTRAST:  200 cc Isovue-300 FLUOROSCOPY TIME:  Fluoroscopy Time: 17 minutes 48 seconds (3,604 mGy). COMPLICATIONS: None immediate. PROCEDURE: Informed consent was obtained from the patient following explanation of the procedure, risks, benefits and alternatives. The patient understands, agrees and consents for the procedure. All questions were addressed. A time out was performed prior to the initiation of the procedure. Maximal barrier sterile technique utilized including caps, mask, sterile gowns, sterile gloves, large sterile drape, hand hygiene, and Betadine prep. Under sterile conditions and local anesthesia, ultrasound micropuncture access performed of the right common femoral artery. Five French sheath inserted. Initially a C2 catheter was advanced. Because of angulation C2 catheter would not select the celiac origin. Catheter exchanged for a Mickelson catheter. Mickelson catheter reformed in the aorta and utilized to eventually select the celiac origin. Selective celiac angiogram performed. Celiac: Celiac origin is patent. Vessels demonstrate diffuse vaso constriction secondary to the acute shock. The splenic, and hepatic branches are patent. Peripheral right hepatic artery active bleeding sites demonstrated within the liver compatible with areas of  pseudoaneurysms. Mickelson catheter was directed to the hepatic vasculature. Through the access, a Renegade STC microcatheter was advanced. Catheter was advanced into the right hepatic peripheral branches. Additional angiograms performed. A mid right hepatic branch was localized as the area of active bleeding. Microcatheter was advanced into the specific branch. Additional selective angiograms performed. Eventually, the microcatheter was advanced peripherally within a right hepatic branch close to the 2 areas of active bleeding/pseudoaneurysms. 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 Active bleeding is demonstrated into the perihepatic hematoma. No biliary communication. Micro coil embolization: For embolization of the bleeding site, 4 3 mm interlock micro coils were deployed within a mid peripheral right hepatic branch. Post embolization angiogram demonstrates no further active bleeding. Catheter was retracted. Additional right hepatic angiograms also confirm no further active bleeding. Microcatheter removed. Final celiac angiogram reconfirms no further active bleeding. Access removed. Hemostasis obtained with an ExoSeal. Patient tolerated procedure well. No immediate complication. 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 Post embolization angiogram confirms arterial occlusion of this branch and no further active bleeding. Electronically Signed   By: Jerilynn Mages.  Shick M.D.   On: 09/14/2017 11:01   Ir US Guide Bx Asp/drain  Result Date: 09/14/2017 INDICATION: Large 9 cm mass of the right lobe of the liver causing biliary obstruction and invading the gallbladder. 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 EXAM: 1. ULTRASOUND CORE BIOPSY OF HEPATIC MASS 2. PERCUTANEOUS TRANSHEPATIC CHOLANGIOGRAM WITH ULTRASOUND GUIDANCE FOR BILIARY ACCESS MEDICATIONS: 3.375 g IV Zosyn; The antibiotic was administered within an appropriate time frame prior to the initiation of the procedure. ANESTHESIA/SEDATION: Moderate (conscious) sedation was employed during this procedure. A total of Versed 4.0 mg and Fentanyl 200 mcg was administered intravenously. Moderate Sedation Time: 84 minutes. The patient's level of consciousness and vital signs were monitored continuously by radiology nursing throughout the procedure under my direct supervision. FLUOROSCOPY TIME:  Fluoroscopy Time: 5 minutes and 18 seconds. 255 mGy. COMPLICATIONS: A few hours following the procedure, it became evident that there was likely a bleeding complication due to hypotension and drop  in hemoglobin. CT confirmed the presence of a large subcapsular hemorrhage surrounding the liver. Subsequent emergent hepatic arteriography and embolization was performed to treat bleeding. SIR LEVEL C - Requires therapy, minor hospitalization (<48 hrs). PROCEDURE: Informed written consent was obtained from the patient after a thorough discussion of the procedural risks, benefits and alternatives. All questions were addressed. Maximal Sterile Barrier Technique was utilized including caps, mask, sterile gowns, sterile gloves, sterile drape, hand hygiene and skin antiseptic. Chlorhexidine was used for skin prep. Local anesthesia was provided with 1% lidocaine. A timeout was performed prior to the initiation of the procedure. Ultrasound was used to initially localize a right lobe hepatic mass. Under ultrasound guidance, a 17 gauge needle was advanced into the hepatic mass. A total of 3 separate coaxial 18 gauge core biopsy samples were obtained of the mass and samples submitted in formalin. Following the biopsy,  Gel-Foam pledgets were injected through the outer needle as the needle was retracted. Ultrasound was performed of the right lobe of the liver. Under ultrasound guidance, a 21 gauge needle was advanced into a right lobe bile duct. Contrast was injected through the needle and a cholangiogram performed with several fluoroscopic images obtained. A guidewire was then advanced through the needle under fluoroscopy. Additional right lobe biliary access was performed with the 21 gauge needle. Further contrast injection was performed. Guidewire advancement was again performed. Further attempts were also made to access bile ducts slightly more inferiorly in the right lobe. FINDINGS: Ultrasound demonstrates a lobulated, ill-defined hypoechoic mass within the inferior right lobe extending into the porta hepatis. Solid tissue was obtained from the hepatic mass. Bile ducts in the right lobe are dilated superior to the mass and biliary anatomy is distorted due to mass effect from the underlying hepatic mass. After access of a dilated bile duct, contrast injection shows diffuse dilatation of right lobe ducts. Guidewire advancement was difficult and only either extended for a short distance or in a retrograde fashion more peripherally in the liver. Central guidewire access could not be obtained. Due to difficulty in obtaining biliary access and guidewire advancement as well as length of the procedure, decision was made to abort attempts at biliary drain placement today. Re-attempt will be made on a later date. IMPRESSION: 1. Ultrasound-guided core biopsy performed of a large right lobe hepatic mass. 2. Percutaneous transhepatic cholangiogram demonstrates diffusely dilated right lobe ducts superior to the mass. Despite ability to cannulate right lobe bile ducts, guidewire advancement was unsuccessful centrally and a biliary drainage catheter could not be placed today. Re-attempt will be made on a later date via either right lobe or  left lobe biliary access. 3. Several hours after the procedure, it became evident that there was a bleeding complication with subcapsular hemorrhage present adjacent to the liver. This did lead to an emergent arteriogram and embolization procedure which will be dictated separately. Electronically Signed   By: Aletta Edouard M.D.   On: 09/14/2017 09:23   Dg Chest Port 1 View  Result Date: 09/13/2017 CLINICAL DATA:  Central line placement. EXAM: PORTABLE CHEST 1 VIEW COMPARISON:  09/09/2017 FINDINGS: New left subclavian central venous line has its tip projecting just above the caval atrial junction. No pneumothorax. Cardiac silhouette is normal in size. No mediastinal or hilar masses. Clear lungs. IMPRESSION: 1. New left subclavian central venous line is well positioned, tip projecting just above the caval atrial junction. 2. No pneumothorax.  No other change from the prior study. Electronically Signed   By: Shanon Brow  Ormond M.D.   On: 09/13/2017 16:42   Ir Percutaneous Transhepatic Cholangiogram  Result Date: 09/14/2017 INDICATION: Large 9 cm mass of the right lobe of the liver causing biliary obstruction and invading the gallbladder. The patient presents for biopsy of the mass as well as percutaneous cholangiogram and biliary drainage due to inability to cannulate the common bile duct by ERCP due to biliary stricture. EXAM: 1. ULTRASOUND CORE BIOPSY OF HEPATIC MASS 2. PERCUTANEOUS TRANSHEPATIC CHOLANGIOGRAM WITH ULTRASOUND GUIDANCE FOR BILIARY ACCESS MEDICATIONS: 3.375 g IV Zosyn; The antibiotic was administered within an appropriate time frame prior to the initiation of the procedure. ANESTHESIA/SEDATION: Moderate (conscious) sedation was employed during this procedure. A total of Versed 4.0 mg and Fentanyl 200 mcg was administered intravenously. Moderate Sedation Time: 84 minutes. The patient's level of consciousness and vital signs were monitored continuously by radiology nursing throughout the procedure  under my direct supervision. FLUOROSCOPY TIME:  Fluoroscopy Time: 5 minutes and 18 seconds. 255 mGy. COMPLICATIONS: A few hours following the procedure, it became evident that there was likely a bleeding complication due to hypotension and drop in hemoglobin. CT confirmed the presence of a large subcapsular hemorrhage surrounding the liver. Subsequent emergent hepatic arteriography and embolization was performed to treat bleeding. SIR LEVEL C - Requires therapy, minor hospitalization (<48 hrs). PROCEDURE: Informed written consent was obtained from the patient after a thorough discussion of the procedural risks, benefits and alternatives. All questions were addressed. Maximal Sterile Barrier Technique was utilized including caps, mask, sterile gowns, sterile gloves, sterile drape, hand hygiene and skin antiseptic. Chlorhexidine was used for skin prep. Local anesthesia was provided with 1% lidocaine. A timeout was performed prior to the initiation of the procedure. Ultrasound was used to initially localize a right lobe hepatic mass. Under ultrasound guidance, a 17 gauge needle was advanced into the hepatic mass. A total of 3 separate coaxial 18 gauge core biopsy samples were obtained of the mass and samples submitted in formalin. Following the biopsy, Gel-Foam pledgets were injected through the outer needle as the needle was retracted. Ultrasound was performed of the right lobe of the liver. Under ultrasound guidance, a 21 gauge needle was advanced into a right lobe bile duct. Contrast was injected through the needle and a cholangiogram performed with several fluoroscopic images obtained. A guidewire was then advanced through the needle under fluoroscopy. Additional right lobe biliary access was performed with the 21 gauge needle. Further contrast injection was performed. Guidewire advancement was again performed. Further attempts were also made to access bile ducts slightly more inferiorly in the right lobe.  FINDINGS: Ultrasound demonstrates a lobulated, ill-defined hypoechoic mass within the inferior right lobe extending into the porta hepatis. Solid tissue was obtained from the hepatic mass. Bile ducts in the right lobe are dilated superior to the mass and biliary anatomy is distorted due to mass effect from the underlying hepatic mass. After access of a dilated bile duct, contrast injection shows diffuse dilatation of right lobe ducts. Guidewire advancement was difficult and only either extended for a short distance or in a retrograde fashion more peripherally in the liver. Central guidewire access could not be obtained. Due to difficulty in obtaining biliary access and guidewire advancement as well as length of the procedure, decision was made to abort attempts at biliary drain placement today. Re-attempt will be made on a later date. IMPRESSION: 1. Ultrasound-guided core biopsy performed of a large right lobe hepatic mass. 2. Percutaneous transhepatic cholangiogram demonstrates diffusely dilated right lobe ducts superior to  the mass. Despite ability to cannulate right lobe bile ducts, guidewire advancement was unsuccessful centrally and a biliary drainage catheter could not be placed today. Re-attempt will be made on a later date via either right lobe or left lobe biliary access. 3. Several hours after the procedure, it became evident that there was a bleeding complication with subcapsular hemorrhage present adjacent to the liver. This did lead to an emergent arteriogram and embolization procedure which will be dictated separately. Electronically Signed   By: Aletta Edouard M.D.   On: 09/14/2017 09:23   [place] Guide Roadmapping  Result Date: 09/14/2017 INDICATION: Acute perihepatic hematoma, status post liver lesion core biopsy EXAM: Ultrasound guidance for vascular access Celiac and peripheral right hepatic artery catheterizations and angiograms. Peripheral right hepatic  artery branch micro coil embolization for pseudoaneurysms and active bleeding. MEDICATIONS: 1% lidocaine local. ANESTHESIA/SEDATION: Moderate Sedation Time: None. The patient's level of consciousness and vital signs were monitored continuously by radiology nursing throughout the procedure under my direct supervision. CONTRAST:  200 cc Isovue-300 FLUOROSCOPY TIME:  Fluoroscopy Time: 17 minutes 48 seconds (3,604 mGy). COMPLICATIONS: None immediate. PROCEDURE: Informed consent was obtained from the patient following explanation of the procedure, risks, benefits and alternatives. The patient understands, agrees and consents for the procedure. All questions were addressed. A time out was performed prior to the initiation of the procedure. Maximal barrier sterile technique utilized including caps, mask, sterile gowns, sterile gloves, large sterile drape, hand hygiene, and Betadine prep. Under sterile conditions and local anesthesia, ultrasound micropuncture access performed of the right common femoral artery. Five French sheath inserted. Initially a C2 catheter was advanced. Because of angulation C2 catheter would not select the celiac origin. Catheter exchanged for a Mickelson catheter. Mickelson catheter reformed in the aorta and utilized to eventually select the celiac origin. Selective celiac angiogram performed. Celiac: Celiac origin is patent. Vessels demonstrate diffuse vaso constriction secondary to the acute shock. The splenic, and hepatic branches are patent. Peripheral right hepatic artery active bleeding sites demonstrated within the liver compatible with areas of pseudoaneurysms. Mickelson catheter was directed to the hepatic vasculature. Through the access, a Renegade STC microcatheter was advanced. Catheter was advanced into the right hepatic peripheral branches. Additional angiograms performed. A mid right hepatic branch was localized as the area of active bleeding. Microcatheter was advanced into the  specific branch. Additional selective angiograms performed. Eventually, the microcatheter was advanced peripherally within a right hepatic branch close to the 2 areas of active bleeding/pseudoaneurysms. 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 Active bleeding is demonstrated into the perihepatic hematoma. No biliary communication. Micro coil embolization: For embolization of the bleeding site, 4 3 mm interlock micro coils were deployed within a mid peripheral right hepatic branch. Post embolization angiogram demonstrates no further active bleeding. Catheter was retracted. Additional right hepatic angiograms also confirm no further active bleeding. Microcatheter removed. Final celiac angiogram reconfirms no further active bleeding. Access removed. Hemostasis obtained with an ExoSeal. Patient tolerated procedure well. No immediate complication. 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 Post embolization angiogram confirms arterial occlusion of this branch and no further active bleeding. Electronically Signed   By: Jerilynn Mages.  Shick M.D.   On: 09/14/2017 11:01   Medications: . norepinephrine (LEVOPHED) Adult infusion 18.987  mcg/min (09/15/17 0600)  . piperacillin-tazobactam (ZOSYN)  IV Stopped (09/15/17 0534)  .  sodium bicarbonate  infusion 1000 mL 125 mL/hr at 09/15/17 0600   . Chlorhexidine Gluconate Cloth  6 each Topical Daily  . levothyroxine  50 mcg Intravenous Daily  . mouth rinse  15 mL Mouth Rinse BID  . pantoprazole (PROTONIX) IV  40 mg Intravenous Q12H  . sodium chloride flush  10-40 mL Intracatheter Q12H    Background: 77 y.o. year-old female PMH significant for HTN, hypothyroidism, breast CA post R mastectomy, liver abscess in 2013, gallstones, baseline  CKD4 (last available creatinine prior to admission was 1.63 in 04/2016). Presented to the ED with fatigue, loss of appetite, jaundice.Labs significant for Hb 9.1, creatinine 3, abnormal LFT's incl bili of 15, stool heme +. Rec'd IVF w/creatinine down as low as 2.45. MRCP 1/26->large mass inferior R liver, gallbladder, medial L liver segment. ERCP 1/28 unable to cross tight biliary stricture. Hypotension post procedure into the 80's and creatinine back up to 3. Then shock/subcapsular bleed requiring embolization 2 hep artery branches 1/29. ATN.   Assessment/Recommendations  1. 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 Initial hit was volume depletion, then shock 2/2 ABLA post liver bx, w/contrast for embolization of hepatic artery bleeders. Now oliguric ATN. Labs still pending from this AM 1/31. Volume probably replete. Suggest dropping fluids back, treating volume with boluses rather than continuous infusion. No indication yet for CRRT. 2. Metabolic acidosis - Remains on bicarb drip, last CO2 up to 21, no labs yet today, dropping rate to 50 pending labs. 3. Anemia - post 2U transfusion for liver bleed Hb up to 8.6, 7.8 this AM. 4. Liver mass with biliary obstruction  - post bx, bleed, then embolization of bleeders; appears plan for another attempt at biliary drain sometime this week. GI closely following. 5. Shock - ABLA. Norepi weaning, still at 6 6. Hypothyroidism   Jamal Maes, MD [facility] [phone] pager 09/15/2017, 6:03 AM

## 2017-09-16 ENCOUNTER — Inpatient Hospital Stay (HOSPITAL_COMMUNITY): Payer: Medicare Other

## 2017-09-16 ENCOUNTER — Inpatient Hospital Stay (HOSPITAL_COMMUNITY): Payer: Medicare Other | Admitting: Anesthesiology

## 2017-09-16 DIAGNOSIS — E872 Acidosis, unspecified: Secondary | ICD-10-CM

## 2017-09-16 DIAGNOSIS — J9600 Acute respiratory failure, unspecified whether with hypoxia or hypercapnia: Secondary | ICD-10-CM

## 2017-09-16 LAB — BLOOD GAS, ARTERIAL
ACID-BASE DEFICIT: 16.7 mmol/L — AB (ref 0.0–2.0)
Acid-base deficit: 11.3 mmol/L — ABNORMAL HIGH (ref 0.0–2.0)
BICARBONATE: 12.6 mmol/L — AB (ref 20.0–28.0)
Bicarbonate: 15.2 mmol/L — ABNORMAL LOW (ref 20.0–28.0)
Drawn by: 11249
Drawn by: 11249
FIO2: 100
LHR: 30 {breaths}/min
MECHVT: 460 mL
O2 CONTENT: 6 L/min
O2 SAT: 88 %
O2 SAT: 97.5 %
PATIENT TEMPERATURE: 91.9
PCO2 ART: 32.6 mmHg (ref 32.0–48.0)
PEEP: 5 cmH2O
PH ART: 7.084 — AB (ref 7.350–7.450)
PO2 ART: 102 mmHg (ref 83.0–108.0)
Patient temperature: 92.4
pCO2 arterial: 41.4 mmHg (ref 32.0–48.0)
pH, Arterial: 7.264 — ABNORMAL LOW (ref 7.350–7.450)
pO2, Arterial: 61.5 mmHg — ABNORMAL LOW (ref 83.0–108.0)

## 2017-09-16 LAB — BASIC METABOLIC PANEL
Anion gap: 23 — ABNORMAL HIGH (ref 5–15)
BUN: 29 mg/dL — AB (ref 6–20)
CALCIUM: 6.3 mg/dL — AB (ref 8.9–10.3)
CO2: 17 mmol/L — ABNORMAL LOW (ref 22–32)
CREATININE: 2.72 mg/dL — AB (ref 0.44–1.00)
Chloride: 100 mmol/L — ABNORMAL LOW (ref 101–111)
GLUCOSE: 93 mg/dL (ref 65–99)
Potassium: 5.3 mmol/L — ABNORMAL HIGH (ref 3.5–5.1)
Sodium: 140 mmol/L (ref 135–145)

## 2017-09-16 LAB — CORTISOL

## 2017-09-16 LAB — CBC WITH DIFFERENTIAL/PLATELET
Band Neutrophils: 16 %
Basophils Absolute: 0 10*3/uL (ref 0.0–0.1)
Basophils Absolute: 0 10*3/uL (ref 0.0–0.1)
Basophils Relative: 0 %
Basophils Relative: 0 %
EOS ABS: 0 10*3/uL (ref 0.0–0.7)
EOS ABS: 0 10*3/uL (ref 0.0–0.7)
EOS PCT: 0 %
Eosinophils Relative: 0 %
HCT: 20.4 % — ABNORMAL LOW (ref 36.0–46.0)
HEMATOCRIT: 19.2 % — AB (ref 36.0–46.0)
Hemoglobin: 7.3 g/dL — ABNORMAL LOW (ref 12.0–15.0)
Hemoglobin: 6.8 g/dL — CL (ref 12.0–15.0)
LYMPHS PCT: 3 %
Lymphocytes Relative: 8 %
Lymphs Abs: 0.7 10*3/uL (ref 0.7–4.0)
Lymphs Abs: 1.8 10*3/uL (ref 0.7–4.0)
MCH: 29 pg (ref 26.0–34.0)
MCH: 28.9 pg (ref 26.0–34.0)
MCHC: 35.8 g/dL (ref 30.0–36.0)
MCHC: 35.4 g/dL (ref 30.0–36.0)
MCV: 81 fL (ref 78.0–100.0)
MCV: 81.7 fL (ref 78.0–100.0)
MONO ABS: 1.3 10*3/uL — AB (ref 0.1–1.0)
MONO ABS: 0.2 10*3/uL (ref 0.1–1.0)
MONOS PCT: 6 %
Monocytes Relative: 1 %
NEUTROS PCT: 91 %
NEUTROS PCT: 75 %
Neutro Abs: 20.4 10*3/uL — ABNORMAL HIGH (ref 1.7–7.7)
Neutro Abs: 20.7 10*3/uL — ABNORMAL HIGH (ref 1.7–7.7)
PLATELETS: 142 10*3/uL — AB (ref 150–400)
PLATELETS: 132 10*3/uL — AB (ref 150–400)
RBC: 2.52 MIL/uL — AB (ref 3.87–5.11)
RBC: 2.35 MIL/uL — AB (ref 3.87–5.11)
RDW: 18.5 % — AB (ref 11.5–15.5)
RDW: 18.8 % — AB (ref 11.5–15.5)
WBC: 22.4 10*3/uL — ABNORMAL HIGH (ref 4.0–10.5)
WBC: 22.7 10*3/uL — AB (ref 4.0–10.5)
nRBC: 22 /100 WBC — ABNORMAL HIGH
nRBC: 55 /100 WBC — ABNORMAL HIGH

## 2017-09-16 LAB — HEPATIC FUNCTION PANEL
ALBUMIN: 1.6 g/dL — AB (ref 3.5–5.0)
ALK PHOS: 299 U/L — AB (ref 38–126)
ALT: 1669 U/L — AB (ref 14–54)
ALT: 2408 U/L — ABNORMAL HIGH (ref 14–54)
AST: 2827 U/L — ABNORMAL HIGH (ref 15–41)
AST: 5807 U/L — ABNORMAL HIGH (ref 15–41)
Albumin: 1.6 g/dL — ABNORMAL LOW (ref 3.5–5.0)
Alkaline Phosphatase: 436 U/L — ABNORMAL HIGH (ref 38–126)
BILIRUBIN DIRECT: 8.8 mg/dL — AB (ref 0.1–0.5)
BILIRUBIN INDIRECT: 5.7 mg/dL — AB (ref 0.3–0.9)
BILIRUBIN INDIRECT: 6.4 mg/dL
BILIRUBIN TOTAL: 14.5 mg/dL — AB (ref 0.3–1.2)
BILIRUBIN TOTAL: 15.5 mg/dL — AB (ref 0.3–1.2)
Bilirubin, Direct: 9.1 mg/dL — ABNORMAL HIGH (ref 0.1–0.5)
TOTAL PROTEIN: 4.2 g/dL — AB (ref 6.5–8.1)
Total Protein: 4.2 g/dL — ABNORMAL LOW (ref 6.5–8.1)

## 2017-09-16 LAB — RENAL FUNCTION PANEL
Albumin: 1.7 g/dL — ABNORMAL LOW (ref 3.5–5.0)
Albumin: 1.6 g/dL — ABNORMAL LOW (ref 3.5–5.0)
Anion gap: 23 — ABNORMAL HIGH (ref 5–15)
Anion gap: 23 — ABNORMAL HIGH (ref 5–15)
BUN: 36 mg/dL — AB (ref 6–20)
BUN: 30 mg/dL — AB (ref 6–20)
CHLORIDE: 103 mmol/L (ref 101–111)
CHLORIDE: 99 mmol/L — AB (ref 101–111)
CO2: 17 mmol/L — ABNORMAL LOW (ref 22–32)
CO2: 17 mmol/L — AB (ref 22–32)
CREATININE: 3.66 mg/dL — AB (ref 0.44–1.00)
CREATININE: 2.93 mg/dL — AB (ref 0.44–1.00)
Calcium: 6.2 mg/dL — CL (ref 8.9–10.3)
Calcium: 6.4 mg/dL — CL (ref 8.9–10.3)
GFR calc Af Amer: 13 mL/min — ABNORMAL LOW (ref >60)
GFR calc Af Amer: 17 mL/min — ABNORMAL LOW (ref >60)
GFR calc non Af Amer: 15 mL/min — ABNORMAL LOW (ref >60)
GFR, EST NON AFRICAN AMERICAN: 11 mL/min — AB (ref >60)
GLUCOSE: 67 mg/dL (ref 65–99)
Glucose, Bld: 95 mg/dL (ref 65–99)
POTASSIUM: 4.3 mmol/L (ref 3.5–5.1)
POTASSIUM: 5 mmol/L (ref 3.5–5.1)
Phosphorus: 6.2 mg/dL — ABNORMAL HIGH (ref 2.5–4.6)
Phosphorus: 5.7 mg/dL — ABNORMAL HIGH (ref 2.5–4.6)
Sodium: 143 mmol/L (ref 135–145)
Sodium: 139 mmol/L (ref 135–145)

## 2017-09-16 LAB — GLUCOSE, CAPILLARY
GLUCOSE-CAPILLARY: 62 mg/dL — AB (ref 65–99)
GLUCOSE-CAPILLARY: 102 mg/dL — AB (ref 65–99)
GLUCOSE-CAPILLARY: 80 mg/dL (ref 65–99)
Glucose-Capillary: 157 mg/dL — ABNORMAL HIGH (ref 65–99)

## 2017-09-16 LAB — LACTIC ACID, PLASMA
LACTIC ACID, VENOUS: 13.3 mmol/L — AB (ref 0.5–1.9)
LACTIC ACID, VENOUS: 15.8 mmol/L — AB (ref 0.5–1.9)
Lactic Acid, Venous: 16.3 mmol/L (ref 0.5–1.9)

## 2017-09-16 LAB — MAGNESIUM: MAGNESIUM: 2.1 mg/dL (ref 1.7–2.4)

## 2017-09-16 MED ORDER — PROPOFOL 10 MG/ML IV BOLUS
INTRAVENOUS | Status: DC | PRN
  Administered 2017-09-16: 50 mg via INTRAVENOUS

## 2017-09-16 MED ORDER — HYDROCORTISONE NA SUCCINATE PF 100 MG IJ SOLR
50.0000 mg | Freq: Four times a day (QID) | INTRAMUSCULAR | Status: DC
  Administered 2017-09-16 (×2): 50 mg via INTRAVENOUS
  Filled 2017-09-16 (×3): qty 2

## 2017-09-16 MED ORDER — SUCCINYLCHOLINE CHLORIDE 200 MG/10ML IV SOSY
PREFILLED_SYRINGE | INTRAVENOUS | Status: AC
  Filled 2017-09-16: qty 10

## 2017-09-16 MED ORDER — MIDAZOLAM HCL 2 MG/2ML IJ SOLN
1.0000 mg | INTRAMUSCULAR | Status: DC | PRN
  Administered 2017-09-16 (×2): 1 mg via INTRAVENOUS
  Filled 2017-09-16: qty 2

## 2017-09-16 MED ORDER — FENTANYL 2500MCG IN NS 250ML (10MCG/ML) PREMIX INFUSION
0.0000 ug/h | INTRAVENOUS | Status: DC
  Administered 2017-09-16: 50 ug/h via INTRAVENOUS
  Filled 2017-09-16: qty 250

## 2017-09-16 MED ORDER — FENTANYL CITRATE (PF) 100 MCG/2ML IJ SOLN: 50.0000 ug | Freq: Once | INTRAMUSCULAR | Status: DC

## 2017-09-16 MED ORDER — SODIUM CHLORIDE 0.9 % IV SOLN
Freq: Once | INTRAVENOUS | Status: AC
  Administered 2017-09-16: 22:00:00 via INTRAVENOUS

## 2017-09-16 MED ORDER — VITAMIN K1 10 MG/ML IJ SOLN
10.0000 mg | Freq: Once | INTRAMUSCULAR | Status: AC
  Administered 2017-09-16: 10 mg via INTRAVENOUS
  Filled 2017-09-16: qty 1

## 2017-09-16 MED ORDER — SODIUM CHLORIDE 0.9 % IV SOLN
2.0000 g | Freq: Once | INTRAVENOUS | Status: AC
  Administered 2017-09-16: 2 g via INTRAVENOUS
  Filled 2017-09-16: qty 20

## 2017-09-16 MED ORDER — PHENYLEPHRINE HCL 10 MG/ML IJ SOLN
INTRAMUSCULAR | Status: DC | PRN
Start: 2017-09-16 — End: 2017-09-16
  Administered 2017-09-16: 80 ug via INTRAVENOUS

## 2017-09-16 MED ORDER — SODIUM BICARBONATE 8.4 % IV SOLN
100.0000 meq | Freq: Once | INTRAVENOUS | Status: AC
  Administered 2017-09-16: 100 meq via INTRAVENOUS
  Filled 2017-09-16: qty 100

## 2017-09-16 MED ORDER — DEXMEDETOMIDINE HCL IN NACL 200 MCG/50ML IV SOLN
0.4000 ug/kg/h | INTRAVENOUS | Status: DC
  Administered 2017-09-16 (×4): 0.4 ug/kg/h via INTRAVENOUS
  Filled 2017-09-16 (×4): qty 50

## 2017-09-16 MED ORDER — PROPOFOL 10 MG/ML IV BOLUS
INTRAVENOUS | Status: AC
  Filled 2017-09-16: qty 20

## 2017-09-16 MED ORDER — FENTANYL CITRATE (PF) 100 MCG/2ML IJ SOLN
50.0000 ug | INTRAMUSCULAR | Status: DC | PRN
  Administered 2017-09-16 (×2): 50 ug via INTRAVENOUS
  Filled 2017-09-16: qty 2

## 2017-09-16 MED ORDER — PHENYLEPHRINE 40 MCG/ML (10ML) SYRINGE FOR IV PUSH (FOR BLOOD PRESSURE SUPPORT)
PREFILLED_SYRINGE | INTRAVENOUS | Status: AC
  Filled 2017-09-16: qty 10

## 2017-09-16 MED ORDER — SODIUM CHLORIDE 0.9 % IV SOLN
1.0000 g | Freq: Once | INTRAVENOUS | Status: AC
  Administered 2017-09-16: 1 g via INTRAVENOUS
  Filled 2017-09-16: qty 10

## 2017-09-16 MED ORDER — VASOPRESSIN 20 UNIT/ML IV SOLN
0.0300 [IU]/min | INTRAVENOUS | Status: DC
  Administered 2017-09-16: 0.03 [IU]/min via INTRAVENOUS
  Filled 2017-09-16 (×2): qty 2

## 2017-09-16 MED ORDER — FENTANYL 2500MCG IN NS 250ML (10MCG/ML) PREMIX INFUSION
25.0000 ug/h | INTRAVENOUS | Status: DC
  Administered 2017-09-16 (×2): 200 ug/h via INTRAVENOUS
  Filled 2017-09-16 (×2): qty 250

## 2017-09-16 MED ORDER — FENTANYL CITRATE (PF) 100 MCG/2ML IJ SOLN
50.0000 ug | INTRAMUSCULAR | Status: DC | PRN
  Administered 2017-09-16 (×2): 50 ug via INTRAVENOUS

## 2017-09-16 MED ORDER — SUCCINYLCHOLINE CHLORIDE 20 MG/ML IJ SOLN
INTRAMUSCULAR | Status: DC | PRN
  Administered 2017-09-16: 80 mg via INTRAVENOUS

## 2017-09-16 MED ORDER — SODIUM BICARBONATE 8.4 % IV SOLN
200.0000 meq | Freq: Once | INTRAVENOUS | Status: AC
  Administered 2017-09-16: 200 meq via INTRAVENOUS
  Filled 2017-09-16: qty 50

## 2017-09-16 MED ORDER — DEXTROSE 50 % IV SOLN
INTRAVENOUS | Status: AC
  Administered 2017-09-16: 50 mL
  Filled 2017-09-16: qty 50

## 2017-09-16 MED ORDER — ORAL CARE MOUTH RINSE
15.0000 mL | Freq: Four times a day (QID) | OROMUCOSAL | Status: DC
  Administered 2017-09-16 (×2): 15 mL via OROMUCOSAL

## 2017-09-16 MED ORDER — MIDAZOLAM HCL 2 MG/2ML IJ SOLN
1.0000 mg | INTRAMUSCULAR | Status: DC | PRN
  Filled 2017-09-16: qty 2

## 2017-09-16 MED ORDER — FENTANYL BOLUS VIA INFUSION
25.0000 ug | INTRAVENOUS | Status: DC | PRN
  Filled 2017-09-16: qty 25

## 2017-09-16 MED ORDER — SODIUM CHLORIDE 0.9 % IV SOLN
0.0000 ug/h | INTRAVENOUS | Status: DC
  Filled 2017-09-16: qty 50

## 2017-09-16 MED ORDER — CHLORHEXIDINE GLUCONATE 0.12% ORAL RINSE (MEDLINE KIT)
15.0000 mL | Freq: Two times a day (BID) | OROMUCOSAL | Status: DC
  Administered 2017-09-16 (×2): 15 mL via OROMUCOSAL

## 2017-09-16 NOTE — Progress Notes (Signed)
Pt deteriorating, mottling, and hypothermic. Family at bedside. MD notified and spoke with oldest son. MD informed this RN and pt's son that pt may not survive the night. Will continue to monitor.

## 2017-09-16 NOTE — Progress Notes (Signed)
PULMONARY / CRITICAL CARE MEDICINE   Name: Chelsey Savage MRN: [id number] DOB: September 22, 1940    ADMISSION DATE:  09/07/2017 CONSULTATION DATE: 1/29  REFERRING MD:  Tawanna Solo   CHIEF COMPLAINT:  Shock   HISTORY OF PRESENT ILLNESS:   77 year old female with no history of chronic kidney disease, hypertension, hypothyroidism, remote history of breast cancer.  Recently treated for a liver abscess.  Presented on 1/24 with chief complaint of weakness and decreased appetite.  In ER found to have acute kidney failure with creatinine rising from baseline of 1.6 up to 3, and increased liver function testing.  She had elevated total bili with jaundice on arrival.  Diagnostic evaluation demonstrated hepatic mass on MRI with intrahepatic biliary obstruction, the mass involve the right lobe of the liver, gallbladder and into the left lobe.  She had markedly dilated intrahepatic ducts as well as cholelithiasis.  He was seen in consultation by gastroenterology.  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  She underwent ERCP on 1/28 where they found a tight biliary stricture with distal edge in the distal common bile duct they were unable to advance wire past the stricture.  Because of this interventional radiology was consulted for percutaneous biliary drainage as well as biopsy of mass.  On 1/29 she will underwent ultrasound-guided liver biopsy, as well as percutaneous transhepatic cholangiogram, unfortunately attempt at percutaneous drainage had to be aborted due to agitation.  The plan was to reevaluate this again on 1/30.  On return from interventional radiology she was hypotensive, encephalopathic, her blood pressure was as low as the 29J systolic.  Fluid challenge was initiated and pulmonary critical care asked to evaluate.   SUBJECTIVE:  Now intubated and critically ill with increasing pressor dependence VITAL SIGNS: BP (!) 149/59   Pulse 91   Temp (!) 91.9 F (33.3  C) (Rectal)   Resp (!) 30   Ht 5\' 5"  (1.651 m)   Wt 255 lb 1.2 oz (115.7 kg)   SpO2 100%   BMI 42.45 kg/m   HEMODYNAMICS: CVP:  [6 mmHg-15 mmHg] 15 mmHg  VENTILATOR SETTINGS: Vent Mode: PRVC FiO2 (%):  [100 %] 100 % Set Rate:  [30 bmp] 30 bmp Vt Set:  [460 mL] 460 mL PEEP:  [5 cmH20] 5 cmH20 Plateau Pressure:  [23 cmH20] 23 cmH20  INTAKE / OUTPUT:  Intake/Output Summary (Last 24 hours) at 09/16/2017 1884 Last data filed at 09/16/2017 0800 Gross per 24 hour  Intake 2903.84 ml  Output 2183 ml  Net 720.84 ml     PHYSICAL EXAMINATION: General: This is a 77 year old female who is critically ill now on full ventilator support.  She appears anxious and uncomfortable on the ventilator HEENT: Normocephalic atraumatic sclera are icteric.  She is now orally intubated.  She has a right internal jugular vein catheter which is in satisfactory position Pulmonary: Some accessory muscle use on the ventilator scattered rhonchi Cardiac: Regular rate and rhythm without murmur rub or gallop Abdomen: Remains tender to palpation of the right upper quadrant.  The abdomen is large, firm, with hypoactive bowel sounds. Extremities/musculoskeletal: Cool, dry, dependent edema, thready pulses. Neuro/psych: Awake, anxious, thrashing her head in the bed.  Moving all extremities, more agitated today  LABS:  BMET Recent Labs  Lab 09/15/17 1101 09/15/17 1753 09/16/17 0406  NA 142 143 143  K 4.1 4.2 4.3  CL 104 103 103  CO2 24 22 17*  BUN 51* 48* 36*  CREATININE 5.00* 4.87* 3.66*  GLUCOSE  104* 106* 67    Electrolytes Recent Labs  Lab 09/14/17 0530  09/15/17 0500 09/15/17 1101 09/15/17 1753 09/16/17 0406  CALCIUM 7.0*   < > 6.6* 6.6* 6.5* 6.2*  MG 1.5*  --  2.0  --   --  2.1  PHOS 4.7*  --  6.0*  --  6.6* 6.2*   < > = values in this interval not displayed.    CBC Recent Labs  Lab 09/15/17 1101 09/15/17 2052 09/16/17 0410  WBC 28.4* 27.4* 22.4*  HGB 7.9* 8.1* 7.3*  HCT 21.7*  22.9* 20.4*  PLT 156 169 142*    Coag's Recent Labs  Lab 09/13/17 0505 09/14/17 0530 09/15/17 0500  INR 1.50 2.32 2.74    Sepsis Markers Recent Labs  Lab 09/15/17 1102 09/15/17 2052 09/16/17 0406  LATICACIDVEN 4.2* 9.1* 13.3*    ABG Recent Labs  Lab 09/15/17 1449 09/16/17 0228 09/16/17 0452  PHART 7.292* 7.084* 7.264*  PCO2ART 46.3 41.4 32.6  PO2ART 65.0* 61.5* 102    Liver Enzymes Recent Labs  Lab 09/14/17 0530 09/15/17 0500 09/15/17 1753 09/16/17 0406  AST 3,689* 5,328*  --  2,827*  ALT 1,645* 2,228*  --  1,669*  ALKPHOS 189* 320*  --  299*  BILITOT 14.7* 16.5*  --  14.5*  ALBUMIN 1.7* 1.8* 1.9* 1.6*  1.7*    Cardiac Enzymes Recent Labs  Lab 09/13/17 2220 09/14/17 0530  TROPONINI 0.05* 0.10*    Glucose Recent Labs  Lab 09/15/17 0620 09/15/17 1159 09/15/17 1819 09/15/17 2344 09/16/17 0625 09/16/17 0650  GLUCAP 104* 95 87 83 62* 157*    Imaging Dg Abd 1 View  Result Date: 09/16/2017 CLINICAL DATA:  OG tube position EXAM: ABDOMEN - 1 VIEW COMPARISON:  None. FINDINGS: Enteric tube tip is in the left upper quadrant consistent with location in the body of the stomach. Surgical coils in the right upper quadrant. Scattered gas and stool in the colon. No small or large bowel distention. IMPRESSION: Enteric tube tip in the left upper quadrant consistent with location in the body of the stomach. Electronically Signed   By: Lucienne Capers M.D.   On: 09/16/2017 04:52   Portable Chest X-ray  Result Date: 09/16/2017 CLINICAL DATA:  Intubation EXAM: PORTABLE CHEST 1 VIEW COMPARISON:  09/15/2017 FINDINGS: Endotracheal tube was placed with tip measuring 3.5 cm above the carina. Right central venous catheter with tip over the mid SVC region. Left central venous catheter with tip over the low SVC region. No pneumothorax. Cardiac enlargement with mild central vascular congestion. Small left pleural effusion with consolidation or atelectasis in the left lung  base, similar to previous study. Shallow inspiration. Calcification of the aorta. IMPRESSION: Appliances appear in satisfactory position. Cardiac enlargement with mild vascular congestion. Small left pleural effusion with consolidation or atelectasis in the left lung base. Electronically Signed   By: Lucienne Capers M.D.   On: 09/16/2017 03:21   Dg Chest Port 1 View  Result Date: 09/15/2017 CLINICAL DATA:  Respiratory distress, line placement EXAM: PORTABLE CHEST 1 VIEW COMPARISON:  Portable exam 1431 hours compared to 09/15/2017 FINDINGS: LEFT subclavian central venous catheter with tip projecting over SVC. RIGHT jugular central venous catheter with tip projecting over SVC. Enlargement of cardiac silhouette with vascular congestion. Perihilar edema. Atherosclerotic calcification aorta. Moderate LEFT pleural effusion and basilar atelectasis. Minimal RIGHT basilar atelectasis as well. No pneumothorax. Bones demineralized. IMPRESSION: No pneumothorax following RIGHT jugular line placement. Increased LEFT pleural effusion and basilar atelectasis versus consolidation. Enlargement  of cardiac silhouette with vascular congestion and mild pulmonary edema. Electronically Signed   By: Lavonia Dana M.D.   On: 09/15/2017 14:58   Dg Chest Port 1 View  Result Date: 09/15/2017 CLINICAL DATA:  F/u SOB EXAM: PORTABLE CHEST 1 VIEW COMPARISON:  Chest x-ray 09/13/2017 FINDINGS: Left-sided subclavian central line tip overlies the level of superior vena cava. Shallow lung inflation. The heart is enlarged. There is increasing opacity in the left lung base, now completely obscuring the left hemidiaphragm. Suspect left pleural effusion. There are interstitial changes compatible with mild edema surgically well seen in the right lung base. Significant atherosclerotic calcification. IMPRESSION: 1. Increased left lower lobe atelectasis or infiltrate and pleural effusion. 2. Interstitial edema. 3.  Aortic atherosclerosis.  (ICD10-I70.0)  Electronically Signed   By: Nolon Nations M.D.   On: 09/15/2017 09:07     STUDIES:  ERCP done on 09/13/17: not able to advance guidewire  MRCP report:Large 7 x 8 x 9 cm mass centered in the inferior right liver, incorporating the mid gallbladder and extending anteriorly and cranially up into the medial segment of the left liver. Mass obstructs the right and left biliary ducts at the level of the confluence and the gallbladder fundus is distended and stone filled. 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 Hepatoduodenal ligament lymphadenopathy versus direct tumor extension into the hepatoduodenal ligament. 1/29: CT abd/pelvis: 1. Hemoperitoneum, most evident adjacent to the liver, which is presumably the source of bleeding, subsequent to the liver biopsy performed today. This is new since the prior MRI. 2. Small amount of air is seen within the right lobe mass adjacent to the gallbladder, presumed secondary to the liver biopsy. 3. Hazy inflammatory type changes now noted along the pancreas which is likely secondary to the liver hemorrhage. AIR perc transhepatic cholangiogram : 1. Ultrasound-guided core biopsy performed of a large right lobe hepatic mass. 2. Percutaneous transhepatic cholangiogram demonstrates diffusely dilated right lobe ducts superior to the mass. Despite ability to cannulate right lobe bile ducts, guidewire advancement was unsuccessful centrally and a biliary drainage catheter could not be placed today. Re-attempt will be made on a later date via either right lobe or left lobe biliary access. 3. Several hours after the procedure, it became evident that there was a bleeding complication with subcapsular hemorrhage present adjacent to the liver. This did lead to an emergent arteriogram and embolization  procedure which will be dictated separately.   CULTURES: BC x 2 1/29>>>  ANTIBIOTICS: Zosyn 1/24>>>1/31 vanc (pharmacy dose) 1/31>>> Meropenem 1/31>>>  SIGNIFICANT EVENTS: 1/29 1/29 unsuccessful, hepatic bx was successful but c/b hemoperitoneum requiring hepatic artery embolization and 2 units PRBC moved to ICU 1/30 looked a little better but still on pressors. Cr up 1/31: creatinine worse, pulmonary edema; started CRRT shock worse 2/1 intubated. Refractory shock.    LINES/TUBES: Left [place] CVL 1/29>>>> Right IJ HD cath 1/31>>> OETT 2/1>>> Left rad aline 2/1>>>  DISCUSSION: This is a 77 year old female who presents with newly diagnosed large liver mass causing obstructive jaundice.  ERCP attempt: unSuccessful.  attempt at percutaneous drain placement on 1/29 unsuccessful, hepatic bx was successful but c/b hemoperitoneum requiring hepatic artery embolization and 2 units PRBC Started CRRT 1/31 Intubated for worsening shock and associated acidosis on 2/1 Remains in refractory shock/mods. Looks uncomfortable.  Plan Cont CRRT Cont full vent support No change in abx Add fent gtt; may need precedex Will call  pathology later today. Would be helpful to have this info going forward w/ goals of care discussion.  Will add stress dose steroids Do not think she will survive   ASSESSMENT / PLAN:  Circulatory shock/MODS: mix of cardiogenic from acidosis +/- sepsis (no longer hemorrhagic)  Initially this was Hemorrhagic shock in setting of Hemoperitoneum following liver Bx S/p successful hepatic arteriogram w/ coil embolization to right hepatic artery (emergent 1/30) -Now in refractory shock on 2 pressors. Not clear if this is sepsis or cardiac mediated in setting of acidosis. The cuff and A line are not correlating making this more difficult to manage.  Plan Ck cortisol Add stress dose steroids Treat acidosis Cont levophed and vasopressin (MAP goal > 65) CVP goal 8-12 Need to d/w family  goals of care. If she arrests we don't have much else to offer.  Day 2 meropenem and vanc  F/u cultures   mild trop elevation in setting of hemorrhagic shock and interestingly no compensatory tachycardia.  Plan Cont tele Await echo  New acute hypoxic respiratory failure in setting of volume overload and complicated by profound acidosis/MODS.  -abg reviewed -pcxr personally reviewed: low volume. No clear edema today. Left basilar vol loss which likely represents mix of atelectasis and effusion  Plan Full vent support Wean FIO2 PAD protocol  F/u CXR in am as well as ABG  Obstructive jaundice from large hepatic mass involving the GB; w/ elevated LFTs -->worse after hypotension reflecting element of shock liver?  -unsuccessful ERCP 1/28 w/ plan to place perc drain -1/29 unsuccessful perc drain  -1/29 Liver bx obtained.  -LFTs improved some as of 2/1 Plan NPO PPI  Needs perc biliary drain but not stable enough and technical component c/b hematoma on top of already technically difficult (unsuccessful attempt 1/29) F/u liver bx. This will be important as we discuss goals of care   Acute encephalopathy: in setting of shock and uremia  Plan PAD protocol w/ RAS goal -2  Acute on chronic renal failure: initially improved. Baseline scr ~ 1.6. Hemodynamically mediated renal insult. Cr spiked to 5 and was associated w/ worsening pulmonary edema and uremic encephalopathy forcing our hands to start CRRT on 1/31  Plan Cont CRRT per nephrology Serial chemistries  Renal dose meds   Profound lactic acidosis in setting of refractory shock. Poor lactate clearance d/t liver dysfxn.  Plan Cont CRRT per nephrology   Intermittent fluid and electrolyte imbalance Plan Trend chem Replace and correct as indicated  Acute blood loss anemia superimposed on chronic anemia  -> Her hemoglobin seems to be holding Plan No AC Cont SCDs Trend cbc Transfuse for hgb < 7  Coagulopathy in setting of  sepsis and liver dysfxn INR rising Plan Vit K X1 Repeat in am   Hypothyroidism Plan Cont IV synthroid   Intermittent Hyperglycemia  Plan May need ssi   DVT prophylaxis: scds SUP: PPI  Diet: NPO Activity: BR Disposition : ICU   FAMILY  - Updates:   - Inter-disciplinary family meet or Palliative Care meeting due by: 2/5 Ccm time 35 minutes   Erick Colace ACNP-BC [place] Pager # [phone] OR # [phone] if no answer  09/16/2017, 8:19 AM  f

## 2017-09-16 NOTE — Progress Notes (Signed)
CRITICAL VALUE ALERT  Critical Value:  Lactic Acid - 16.3  Date & Time Notied:  09/16/2017 2342  Provider Notified: E-link    Orders Received/Actions taken: Repeat to be drawn in 3 hours

## 2017-09-16 NOTE — Anesthesia Procedure Notes (Signed)
Procedure Name: Intubation Date/Time: 09/16/2017 2:57 AM Performed by: Anne Fu, CRNA Pre-anesthesia Checklist: Patient identified, Emergency Drugs available, Suction available, Patient being monitored and Timeout performed Patient Re-evaluated:Patient Re-evaluated prior to induction Oxygen Delivery Method: Circle system utilized Preoxygenation: Pre-oxygenation with 100% oxygen Induction Type: IV induction Ventilation: Mask ventilation without difficulty Laryngoscope Size: Mac and 4 Grade View: Grade I Tube type: Oral Tube size: 7.5 mm Number of attempts: 1 Airway Equipment and Method: Stylet Placement Confirmation: ETT inserted through vocal cords under direct vision,  breath sounds checked- equal and bilateral and CO2 detector Secured at: 23 cm Tube secured with: Tape Dental Injury: Teeth and Oropharynx as per pre-operative assessment

## 2017-09-16 NOTE — Progress Notes (Signed)
CRITICAL VALUE ALERT  Critical Value:  HgB - 6.8, Ca+ - 6.4, Lactic Acid - 15.8.  Date & Time Notied:  09/16/2017   Provider Notified: E-link    Orders Received/Actions taken: Awaiting further orders

## 2017-09-16 NOTE — Progress Notes (Signed)
PCCM Interval Note   Hemoglobin 6.8 Lactic Acid 15.8 Ca+ 6.4  Calcium Replacement is currently transfusing. Type and Screen and one unit of RBC with repeat CBC ordered.   Spoke with son on phone who understands severity of condition, would like to resume current medical therapies. Currently on 47 mcg Levophed and 0.03 Vasopressin, will not escalate care beyond this.    Hayden Pedro, AGACNP-BC [facility]  Pgr: [phone]  PCCM Pgr: [phone]

## 2017-09-16 NOTE — Progress Notes (Signed)
[facility] Rounding Note  Subjective/Interval History:  Events noted w/acute decompensation  Hypotension, worsening resp status, CRRT initiated yesterday midday Progressive hypotension during the night Now on 2 pressors (Maxed out on levo/on vasopressin at 0.03 u/min) Unable to remove volume d/t shock Aline and cuff pressures discordant (cuff higher) Bicarb drip^100 for acidosis (lactic acid ^13)   CRRT 09/15/17 All 4K fluids 500/300/1000 No heparin R IJ temp cath No catheter or filter issues  Objective Vital signs in last 24 hours: Vitals:   09/16/17 0415 09/16/17 0445 09/16/17 0500 09/16/17 0600  BP: (!) 109/42 (!) 166/56 (!) 152/41 (!) 175/60  Pulse: 81 80 81 83  Resp: (!) 30 (!) 30 (!) 30 (!) 28  Temp:  (!) 91.9 F (33.3 C)    TempSrc:  Rectal    SpO2: 100% 100% 100% 100%  Weight:   115.7 kg (255 lb 1.2 oz)   Height:       Weight change: -1 kg (-3.3 oz)  Intake/Output Summary (Last 24 hours) at 09/16/2017 0639 Last data filed at 09/16/2017 0600 Gross per 24 hour  Intake 2551.14 ml  Output 1833 ml  Net 718.14 ml   Physical Exam:  Blood pressure (!) 175/60, pulse 83, temperature (!) 91.9 F (33.3 C), temperature source Rectal, resp. rate (!) 28, height 5' 5"  (1.651 m), weight 115.7 kg (255 lb 1.2 oz), SpO2 100 %.  Intubated Current A-line pressure 108/cuff 127 Sedated but responds Coarse BS Heart sounds obscured d/t resp noise Min LE edema R IJ temp cath in use for CRRT  Recent Labs  Lab 09/13/17 2200 09/14/17 0530 09/14/17 1344 09/15/17 0500 09/15/17 1101 09/15/17 1753 09/16/17 0406  NA 141 142 144 142 142 143 143  K 4.4 3.8 3.6 3.8 4.1 4.2 4.3  CL 116* 112* 111 106 104 103 103  CO2 10* 18* 21* 24 24 22  17*  GLUCOSE 226* 218* 140* 113* 104* 106* 67  BUN 41* 42* 45* 49* 51* 48* 36*  CREATININE 3.60* 3.56* 3.97* 4.54* 5.00* 4.87* 3.66*  CALCIUM 6.8* 7.0* 6.9* 6.6* 6.6* 6.5* 6.2*  PHOS  --  4.7*  --  6.0*  --  6.6* 6.2*    Recent Labs  Lab 09/14/17 0530  09/15/17 0500 09/15/17 1753 09/16/17 0406  AST 3,689* 5,328*  --  2,827*  ALT 1,645* 2,228*  --  1,669*  ALKPHOS 189* 320*  --  299*  BILITOT 14.7* 16.5*  --  14.5*  PROT 4.6* 4.5*  --  4.2*  ALBUMIN 1.7* 1.8* 1.9* 1.6*  1.7*     Recent Labs  Lab 09/14/17 0530 09/14/17 1344 09/15/17 0500 09/15/17 1101 09/15/17 2052  WBC 25.5* 22.5* 27.1* 28.4* 27.4*  NEUTROABS 23.0* 19.2*  --  25.8* 25.1  HGB 9.0* 7.7* 7.8* 7.9* 8.1*  HCT 25.8* 21.6* 21.5* 21.7* 22.9*  MCV 81.1 79.4 78.5 78.9 79.8  PLT 225 171 153 156 169   Recent Labs  Lab 09/13/17 2220 09/14/17 0530  TROPONINI 0.05* 0.10*    Recent Labs  Lab 09/14/17 2334 09/15/17 0620 09/15/17 1159 09/15/17 1819 09/15/17 2344  GLUCAP 110* 104* 95 87 83   ABG    Component Value Date/Time   PHART 7.264 (L) 09/16/2017 0452   PCO2ART 32.6 09/16/2017 0452   PO2ART 102 09/16/2017 0452   HCO3 15.2 (L) 09/16/2017 0452   ACIDBASEDEF 11.3 (H) 09/16/2017 0452   O2SAT 97.5 09/16/2017 0452       Component Value Date/Time   LATICACIDVEN 13.3 (HH) 09/16/2017  0175   Studies/Results: Dg Abd 1 View  Result Date: 09/16/2017 CLINICAL DATA:  OG tube position EXAM: ABDOMEN - 1 VIEW COMPARISON:  None. FINDINGS: Enteric tube tip is in the left upper quadrant consistent with location in the body of the stomach. Surgical coils in the right upper quadrant. Scattered gas and stool in the colon. No small or large bowel distention. IMPRESSION: Enteric tube tip in the left upper quadrant consistent with location in the body of the stomach. Electronically Signed   By: Lucienne Capers M.D.   On: 09/16/2017 04:52   Portable Chest X-ray  Result Date: 09/16/2017 CLINICAL DATA:  Intubation EXAM: PORTABLE CHEST 1 VIEW COMPARISON:  09/15/2017 FINDINGS: Endotracheal tube was placed with tip measuring 3.5 cm above the carina. Right central venous catheter with tip over the mid SVC region. Left central venous catheter with tip over the low SVC region. No  pneumothorax. Cardiac enlargement with mild central vascular congestion. Small left pleural effusion with consolidation or atelectasis in the left lung base, similar to previous study. Shallow inspiration. Calcification of the aorta. IMPRESSION: Appliances appear in satisfactory position. Cardiac enlargement with mild vascular congestion. Small left pleural effusion with consolidation or atelectasis in the left lung base. Electronically Signed   By: Lucienne Capers M.D.   On: 09/16/2017 03:21   Dg Chest Port 1 View  Result Date: 09/15/2017 CLINICAL DATA:  Respiratory distress, line placement EXAM: PORTABLE CHEST 1 VIEW COMPARISON:  Portable exam 1431 hours compared to 09/15/2017 FINDINGS: LEFT subclavian central venous catheter with tip projecting over SVC. RIGHT jugular central venous catheter with tip projecting over SVC. Enlargement of cardiac silhouette with vascular congestion. Perihilar edema. Atherosclerotic calcification aorta. Moderate LEFT pleural effusion and basilar atelectasis. Minimal RIGHT basilar atelectasis as well. No pneumothorax. Bones demineralized. IMPRESSION: No pneumothorax following RIGHT jugular line placement. Increased LEFT pleural effusion and basilar atelectasis versus consolidation. Enlargement of cardiac silhouette with vascular congestion and mild pulmonary edema. Electronically Signed   By: Lavonia Dana M.D.   On: 09/15/2017 14:58   Dg Chest Port 1 View  Result Date: 09/15/2017 CLINICAL DATA:  F/u SOB EXAM: PORTABLE CHEST 1 VIEW COMPARISON:  Chest x-ray 09/13/2017 FINDINGS: Left-sided subclavian central line tip overlies the level of superior vena cava. Shallow lung inflation. The heart is enlarged. There is increasing opacity in the left lung base, now completely obscuring the left hemidiaphragm. Suspect left pleural effusion. There are interstitial changes compatible with mild edema surgically well seen in the right lung base. Significant atherosclerotic calcification.  IMPRESSION: 1. Increased left lower lobe atelectasis or infiltrate and pleural effusion. 2. Interstitial edema. 3.  Aortic atherosclerosis.  (ICD10-I70.0) Electronically Signed   By: Nolon Nations M.D.   On: 09/15/2017 09:07   Medications: . calcium gluconate 2 g (09/16/17 0603)  . heparin 999 mL/hr at 09/15/17 1703  . meropenem (MERREM) IV Stopped (09/15/17 2132)  . norepinephrine (LEVOPHED) Adult infusion 60 mcg/min (09/16/17 0600)  . dialysis replacement fluid (prismasate) 500 mL/hr at 09/16/17 0231  . dialysis replacement fluid (prismasate) 300 mL/hr at 09/15/17 1702  . dialysate (PRISMASATE) 1,000 mL/hr at 09/16/17 0231  .  sodium bicarbonate  infusion 1000 mL 100 mL/hr at 09/16/17 0600  . vancomycin    . vasopressin (PITRESSIN) infusion - *FOR SHOCK* 0.03 Units/min (09/16/17 0600)   . chlorhexidine gluconate (MEDLINE KIT)  15 mL Mouth Rinse BID  . Chlorhexidine Gluconate Cloth  6 each Topical Daily  . dextrose      . levothyroxine  50 mcg Intravenous Daily  . mouth rinse  15 mL Mouth Rinse QID  . pantoprazole (PROTONIX) IV  40 mg Intravenous Q12H  . sodium chloride flush  10-40 mL Intracatheter Q12H    Background: 77 y.o. year-old female PMH significant for HTN, hypothyroidism, breast CA post R mastectomy, liver abscess in 2013, gallstones, baseline CKD4 (last available creatinine prior to admission was 1.63 in 04/2016). Presented to the ED with fatigue, loss of appetite, jaundice.Labs significant for Hb 9.1, creatinine 3, abnormal LFT's incl bili of 15, stool heme +. Rec'd IVF w/creatinine down as low as 2.45. MRCP 1/26->large mass inferior R liver, gallbladder, medial L liver segment. ERCP 1/28 unable to cross tight biliary stricture. Hypotension post procedure into the 80's and creatinine back up to 3. Then shock/subcapsular bleed requiring embolization 2 hep artery branches 1/29. ATN.   Assessment/Recommendations  1. Oliguric AKI on CKD3/4 - baseline creatinine 1.85 05/2017 at  [facility], with baseline CKD 2/2 HTN and prior AKI back in 2013. Initial hit this adm volume depletion, then shock 2/2 ABLA post liver bx, w/contrast for embolization of hepatic artery bleeders. Oliguric ATN. CRRT initiated 1/31 unable to remove volume due to progressive shock. CXR this AM mild vascular congestion. UF as tolerated. Current CVP ~41. 2. Metabolic acidosis - Worsening lactic acidosis (liver failure and shock). Bicarb drip just ^ to 100/hour by CCM. 3. Hypocalcemia - s/p ca gluconate 4. Anemia - post 2U transfusion for liver bleed (Hb 5.7) holding in 7's 5. Liver mass with biliary obstruction  - post bx, bleed, then embolization of bleeders; LFT's have come down some 6. ID vanco and meropenem  Jamal Maes, MD [facility] [phone] pager 09/16/2017, 6:39 AM

## 2017-09-16 NOTE — Progress Notes (Signed)
CRRT stopped at 1535, restarted at 1717.

## 2017-09-16 NOTE — Progress Notes (Signed)
eLink Physician-Brief Progress Note Patient Name: Chelsey Savage DOB: [birth year] MRN: [id number]   Date of Service  09/16/2017  HPI/Events of Note  Hypocalcemia Worsening lactic acidosis in the setting of liver failure  eICU Interventions  Calcium replaced Increase in bicarb gtt to facilitate improvement in pH.     Intervention Category Intermediate Interventions: Electrolyte abnormality - evaluation and management  DETERDING,ELIZABETH 09/16/2017, 5:33 AM

## 2017-09-16 NOTE — Progress Notes (Signed)
Patient's son Paradise Vensel) called and informed about mother's intubation d/t increase work of breathing and PH of 7.08. Patient now intubated. Pressure remaining low despite max out on Levophed. New orders for Vasopressin. Will continue to monitor patient.

## 2017-09-16 NOTE — Progress Notes (Signed)
Patient still not adequately sedated per RASS score, patient RASS goal -2, patient at -1 currently. Marni Griffon ordered to start precedex. Also, patient BP is hypotensive, Marni Griffon ordered continue to titrate Levophed at max of 173mcg. Will continue to monitor and assess.

## 2017-09-16 NOTE — Progress Notes (Signed)
[facility] Progress Note Patient Name: Chelsey Savage DOB: [birth year] MRN: [id number]   Date of Service  09/16/2017  HPI/Events of Note  Patient has been less responsive thoroughout the night and more hypotensive requiring increasing pressor support.  ABG shows metabolic acidosis despite bicarb gtt and crrt.  PH 7.08/41/61/12.6.  Lactate has increased from 4.2 to 9.1  eICU Interventions  Plan: 4 amps of bicarb IVP Intubate for acidosis, mental status F/U on PCXR and repeat ABG Continue to monitor H/H Consider re-scan of abd     Intervention Category Major Interventions: Acid-Base disturbance - evaluation and management  DETERDING,ELIZABETH 09/16/2017, 2:48 AM

## 2017-09-16 NOTE — Progress Notes (Signed)
CRITICAL VALUE ALERT  Critical Value:  Calcium -6.4   Date & Time Notied: 09/16/2017, 1830  Provider Notified: Dr. Chase Caller   Orders Received/Actions taken: Paged MD, Calcium gluconate 1g

## 2017-09-16 NOTE — Progress Notes (Addendum)
   09/16/17 0215  Vitals  BP (!) 96/26  MAP (mmHg) (!) 50  Pulse Rate 79  ECG Heart Rate 79  Resp (!) 22  Oxygen Therapy  SpO2 91 %  O2 Device Nasal Cannula  O2 Flow Rate (L/min) 6 L/min  Pulse Oximetry Type Continuous   Dr. Jimmy Footman informed of inconsistent blood pressure cuff reading. Patient is also confused and oxygen requirements have increased during night. Order written for a-line & ABG. Will continue to monitor.   0245--ABG results called to MD by RT. Decision made by Dr. Jimmy Footman to intubate patient. CRNA called to intubate patient.

## 2017-09-16 NOTE — Progress Notes (Signed)
[facility] Progress Note    Since last GI note: Intubated overnight. On two pressors now.  Objective: Vital signs in last 24 hours: Temp:  [91.9 F (33.3 C)-97.6 F (36.4 C)] 94.4 F (34.7 C) (02/01 0900) Pulse Rate:  [73-97] 88 (02/01 0900) Resp:  [19-32] 32 (02/01 0900) BP: (59-175)/(14-109) 114/61 (02/01 0900) SpO2:  [87 %-100 %] 100 % (02/01 0900) Arterial Line BP: (62-111)/(31-50) 62/36 (02/01 0900) FiO2 (%):  [100 %] 100 % (02/01 0307) Weight:  [255 lb 1.2 oz (115.7 kg)] 255 lb 1.2 oz (115.7 kg) (02/01 0500) Last BM Date: 09/10/17(per patient) General: alert and oriented times 0 (intubated but responsive) Heart: regular rate and rythm Abdomen: soft, non-tender, non-distended, normal bowel sounds   Lab Results: Recent Labs    09/15/17 1101 09/15/17 2052 09/16/17 0410  WBC 28.4* 27.4* 22.4*  HGB 7.9* 8.1* 7.3*  PLT 156 169 142*  MCV 78.9 79.8 81.0   Recent Labs    09/15/17 1101 09/15/17 1753 09/16/17 0406  NA 142 143 143  K 4.1 4.2 4.3  CL 104 103 103  CO2 24 22 17*  GLUCOSE 104* 106* 67  BUN 51* 48* 36*  CREATININE 5.00* 4.87* 3.66*  CALCIUM 6.6* 6.5* 6.2*   Recent Labs    09/14/17 0530 09/15/17 0500 09/15/17 1753 09/16/17 0406  PROT 4.6* 4.5*  --  4.2*  ALBUMIN 1.7* 1.8* 1.9* 1.6*  1.7*  AST 3,689* 5,328*  --  2,827*  ALT 1,645* 2,228*  --  1,669*  ALKPHOS 189* 320*  --  299*  BILITOT 14.7* 16.5*  --  14.5*  BILIDIR  --   --   --  8.8*  IBILI  --   --   --  5.7*   Recent Labs    09/14/17 0530 09/15/17 0500  INR 2.32 2.74     Medications: Scheduled Meds: . chlorhexidine gluconate (MEDLINE KIT)  15 mL Mouth Rinse BID  . Chlorhexidine Gluconate Cloth  6 each Topical Daily  . hydrocortisone sod succinate (SOLU-CORTEF) inj  50 mg Intravenous Q6H  . levothyroxine  50 mcg Intravenous Daily  . mouth rinse  15 mL Mouth Rinse QID  . pantoprazole (PROTONIX) IV  40 mg Intravenous Q12H  . sodium chloride flush  10-40 mL  Intracatheter Q12H   Continuous Infusions: . fentaNYL infusion INTRAVENOUS 175 mcg/hr (09/16/17 1008)  . heparin 999 mL/hr at 09/15/17 1703  . meropenem (MERREM) IV 1 g (09/16/17 0925)  . norepinephrine (LEVOPHED) Adult infusion 60 mcg/min (09/16/17 0859)  . phytonadione (VITAMIN K) IV 10 mg (09/16/17 0937)  . dialysis replacement fluid (prismasate) 500 mL/hr at 09/16/17 0231  . dialysis replacement fluid (prismasate) 300 mL/hr at 09/15/17 1702  . dialysate (PRISMASATE) 1,000 mL/hr at 09/16/17 0751  .  sodium bicarbonate  infusion 1000 mL 100 mL/hr at 09/16/17 0700  . vancomycin    . vasopressin (PITRESSIN) infusion - *FOR SHOCK* 0.03 Units/min (09/16/17 0700)   PRN Meds:.fentaNYL (SUBLIMAZE) injection, fentaNYL (SUBLIMAZE) injection, heparin, heparin, midazolam, midazolam, [DISCONTINUED] ondansetron **OR** ondansetron (ZOFRAN) IV, sodium chloride flush    Assessment/Plan: 77 y.o. female with liver mass, jaundice, MOSF now  I discussed her situation with ICU team and her family in the room today.  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  Seems most likely cholangio clinically.  She is in MOSF.  CC team is continuing aggressive supportive care. Family understands that her situation is very serious and she may not survive to have the cancer  addressed, treated.  [facility] will return to see her on Monday, please call or page sooner if any issues.  I am on all weekend.   Milus Banister, MD  09/16/2017, 10:15 AM [facility] Pager [phone]

## 2017-09-16 NOTE — Progress Notes (Signed)
Spoke w/ son. She is in refractory shock, on multiple pressors & still hypotensive.  Nothing else to offer.   Plan Cont current rx No CPR if she arrests  I have tried to prepare them that she is unlikely to survive.   Erick Colace ACNP-BC [place] Pager # [phone] OR # [phone] if no answer

## 2017-09-16 NOTE — Procedures (Signed)
Arterial Catheter Insertion Procedure Note ALAN RILES [id number] [birth year]  Procedure: Insertion of Arterial Catheter  Indications: Blood pressure monitoring  Procedure Details Consent: Unable to obtain consent because of altered level of consciousness. Time Out: Verified patient identification, verified procedure, site/side was marked, verified correct patient position, special equipment/implants available, medications/allergies/relevent history reviewed, required imaging and test results available.  Performed  Maximum sterile technique was used including antiseptics, cap, gloves, gown, hand hygiene, mask and sheet. Skin prep: Chlorhexidine; local anesthetic administered 20 gauge catheter was inserted into left radial artery using the Seldinger technique.  Evaluation Blood flow good; BP tracing good. Complications: No apparent complications.   Rosann Auerbach 09/16/2017

## 2017-09-16 NOTE — Progress Notes (Signed)
CRITICAL VALUE ALERT  Critical Value: Calcium 6.2     Lactic acid 13.3  Date & Time Notied: 09/16/17  0522  Provider Notified: CCM  Orders Received/Actions taken: Calcium gluconate

## 2017-09-16 NOTE — Transfer of Care (Signed)
Immediate Anesthesia Transfer of Care Note  Patient: Chelsey Savage  Procedure(s) Performed: AN AD [place]  Patient Location: ICU  Anesthesia Type:N/A  Level of Consciousness: unresponsive  Airway & Oxygen Therapy: Patient remains intubated per anesthesia plan  Post-op Assessment: Report given to RN and Post -op Vital signs reviewed and stable  Post vital signs: Reviewed and stable  Last Vitals:  Vitals:   09/16/17 0030 09/16/17 0307  BP: 107/60   Pulse: 87   Resp:    Temp:    SpO2: 93% 100%    Last Pain:  Vitals:   09/15/17 2346  TempSrc: Axillary  PainSc:       Patients Stated Pain Goal: 3 (96/29/52 8413)  Complications: No apparent anesthesia complications

## 2017-09-16 DEATH — deceased

## 2017-09-17 LAB — TYPE AND SCREEN
ABO/RH(D): O POS
ANTIBODY SCREEN: NEGATIVE
UNIT DIVISION: 0
UNIT DIVISION: 0
Unit division: 0
Unit division: 0

## 2017-09-17 LAB — BPAM RBC
BLOOD PRODUCT EXPIRATION DATE: 201902282359
Blood Product Expiration Date: 201902282359
Blood Product Expiration Date: 201902282359
Blood Product Expiration Date: 201902282359
ISSUE DATE / TIME: 201902012115
ISSUE DATE / TIME: 201901291737
ISSUE DATE / TIME: 201901292338
UNIT TYPE AND RH: 5100
Unit Type and Rh: 5100
Unit Type and Rh: 5100
Unit Type and Rh: 5100

## 2017-09-17 LAB — PROTIME-INR
INR: 6.6
Prothrombin Time: 57.2 seconds — ABNORMAL HIGH (ref 11.4–15.2)

## 2017-09-17 LAB — GLUCOSE, CAPILLARY: Glucose-Capillary: 98 mg/dL (ref 65–99)

## 2017-09-17 MED ORDER — SODIUM CHLORIDE 0.9 % IV SOLN
1.0000 g | Freq: Once | INTRAVENOUS | Status: AC
  Administered 2017-09-17: 1 g via INTRAVENOUS
  Filled 2017-09-17 (×2): qty 10

## 2017-09-18 LAB — CULTURE, BLOOD (ROUTINE X 2)
CULTURE: NO GROWTH
Culture: NO GROWTH
SPECIAL REQUESTS: ADEQUATE
Special Requests: ADEQUATE

## 2017-09-20 ENCOUNTER — Telehealth: Payer: Self-pay

## 2017-09-20 NOTE — Telephone Encounter (Signed)
On 09/20/17 I received a d/c from [facility] (original). The d/c is for burial. The patient is a patient of Doctor Ramaswamy. The d/c will be taken to Pulmonary Unit @ [facility] for signature.  On 09/21/17 I received the d/c back from Doctor Ramaswamy. I got the d/c ready and called the funeral home to let them know the d/c is ready for pickup.

## 2017-10-14 NOTE — Discharge Summary (Signed)
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 patient with    has a past medical history of Allergy, Breast cancer ([place]), Chronic knee pain, Colon polyp, GERD (gastroesophageal reflux disease), Hypertension, Hypothyroidism, Liver abscess, and Vertigo.   has a past surgical history that includes Mastectomy (Right, 1979); Heel spur surgery (Right); Tonsillectomy; IR PERCUTANEOUS TRANSHEPATIC CHOLANGIOGRAM (09/13/2017); IR US Guide Bx Asp/Drain (09/13/2017); IR Angiogram Visceral Selective (09/13/2017); IR Angiogram Selective Each Additional Vessel (09/13/2017); IR US Guide Vasc Access Right (09/13/2017); IR EMBO ART  VEN HEMORR LYMPH EXTRAV  INC GUIDE ROADMAPPING (09/13/2017); IR Angiogram Selective Each Additional Vessel (09/13/2017); IR Angiogram Selective Each Additional Vessel (09/13/2017); and ERCP (N/A, 09/03/2017).   Admitted on 08/18/2017 with   77 year old female with no history of chronic kidney disease, hypertension, hypothyroidism, remote history of breast cancer.  Recently treated for a liver abscess.  Presented on 1/24 with chief complaint of weakness and decreased appetite.  In ER found to have acute kidney failure with creatinine rising from baseline of 1.6 up to 3, and increased liver function testing.  She had elevated total bili with jaundice on arrival.  Diagnostic evaluation demonstrated hepatic mass on MRI with intrahepatic biliary obstruction, the mass involve the right lobe  of the liver, gallbladder and into the left lobe.  She had markedly dilated intrahepatic ducts as well as cholelithiasis.  He was seen in consultation by gastroenterology.  Working diagnosis was presumed either [place], gallbladder cancer, or cholangiocarcinoma.  She underwent ERCP on 1/28 where they found a tight biliary stricture with distal edge in the distal common bile duct they were unable to advance wire past the stricture.  Because of this interventional radiology was consulted for percutaneous biliary drainage as well as biopsy of mass.  On 1/29 she will underwent ultrasound-guided liver biopsy, as well as percutaneous transhepatic cholangiogram, unfortunately attempt at percutaneous drainage had to be aborted due to agitation.  The plan was to reevaluate this again on 1/30.  On return from interventional radiology she was hypotensive, encephalopathic, her blood pressure was as low as the 83J systolic.  Fluid challenge was initiated and pulmonary critical care asked to evaluate 09/13/17   Course  suspicion following above was hemorrhagic shock. Patient went back to IR suite 09/13/17 and and was found to have acute hepatic hematoma s/p liver biopsy . She was thenS/p MICRO COIL EMBO OF 2 RT HEPATIC ARTERY PERIPHERAL BRANCH PSAs. She seemed to initially stabilize with this and PRBC but she continued to be in circulatory shock needing pressors. She also developed acute renal failure. Was started on CRRT 09/15/17 and intubaetd overnight Biopsy report came back 09/16/17 showing adenocarcinma; likely cholangicarcinoma. On this day she was in full blow multi-organ failure. Patient was kept DNR but full medical care after d/w family. She expired 3.44am 2017-10-02                   SIGNED Dr. Brand Males,  M.D., F.C.C.P Pulmonary and Critical Care Medicine Staff Physician [facility] Pager: [phone], If no answer or between  15:00h - 7:00h: call  [phone]  09/26/2017 5:16 PM

## 2017-10-14 NOTE — Progress Notes (Signed)
Wasted 221ml of Fentanyl with Leonie Man RN.

## 2017-10-14 NOTE — Progress Notes (Signed)
Patient time of death occurred at 0344 am. No spontaneous signs of life noted.  Pronounced by Rob, RN and Urban Gibson, RN.  MD notified and family at bedside.

## 2017-10-14 DEATH — deceased

## 2017-11-30 ENCOUNTER — Encounter (HOSPITAL_COMMUNITY): Payer: Self-pay | Admitting: Interventional Radiology

## 2019-03-27 IMAGING — MR MR 3D RECON AT SCANNER
12 series · 16 of 16 positions shown · IV contrast (Yes)
Comparison: Ultrasound exam 09/09/2017 CT scan 04/12/2012.

CLINICAL DATA: Abnormal LFTs.

EXAM:
MRI ABDOMEN WITHOUT CONTRAST  (INCLUDING MRCP)
TECHNIQUE: Multiplanar multisequence MR imaging of the abdomen was performed.
Heavily T2-weighted images of the biliary and pancreatic ducts were
obtained, and three-dimensional MRCP images were rendered by post
processing.

[Series 3: T2 fat-sat · axial · 5.0mm · 0.78mm/px · 1 of 42 slices shown]
[im 1/42]
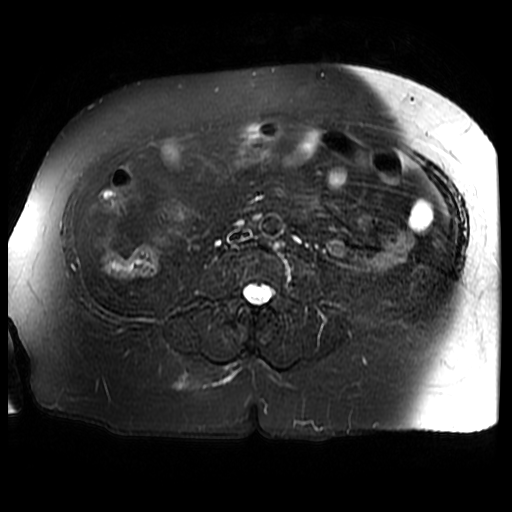

[Series 4: DWI b500 · axial · 6.0mm · 1.56mm/px · 1 of 59 slices shown]
[im 1/59]
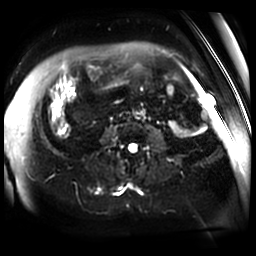

[Series 5: MRCP · coronal · 1.8mm · 0.70mm/px · 2 of 145 slices shown (1 of 3)]
[im 1/145]
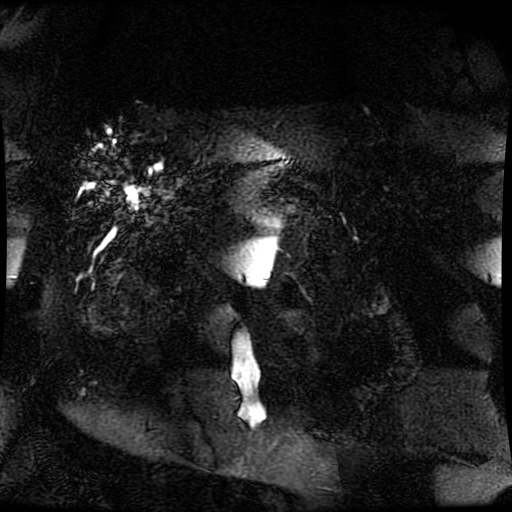
[im 145/145]
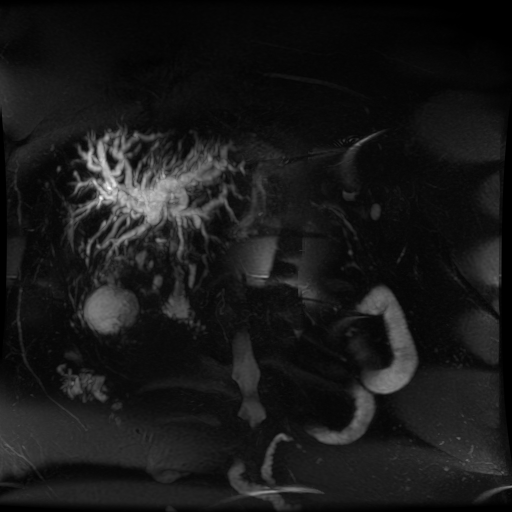

[Series 6: MRCP · coronal · 2.0mm · 0.70mm/px · 1 of 65 slices shown (2 of 3)]
[im 1/65]
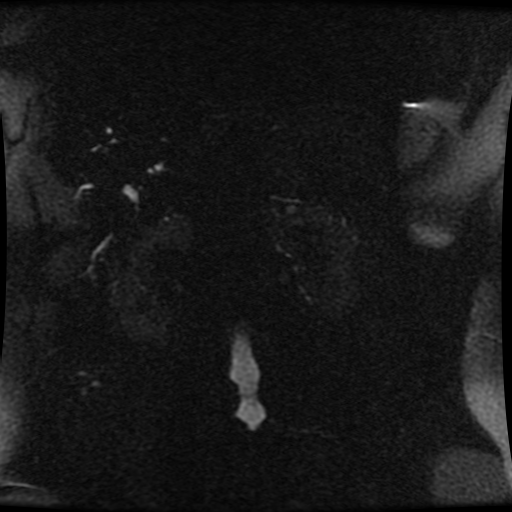

[Series 8: ax dualecho · axial · 5.0mm · 0.82mm/px · 1 of 88 slices shown]
[im 1/88]
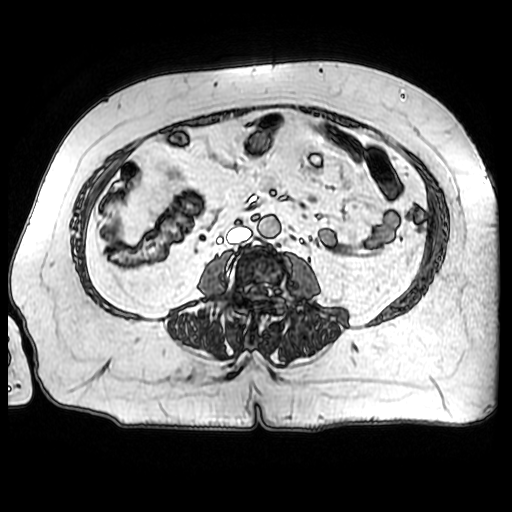

[Series 9: bSSFP fat-sat · coronal · 5.0mm · 0.78mm/px · 1 of 46 slices shown]
[im 1/46]
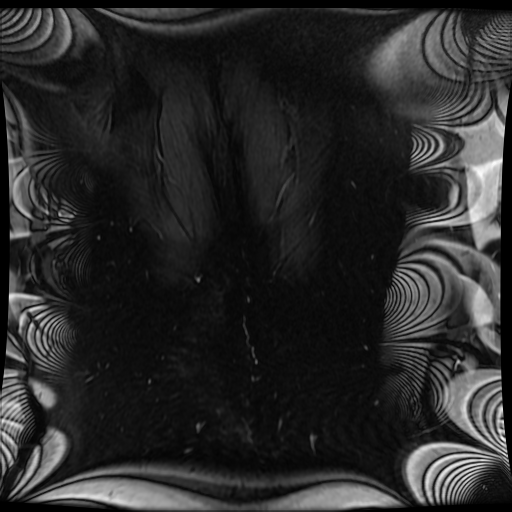

[Series 10: MRCP · coronal · 40.0mm · 0.74mm/px · 1 of 8 slices shown (3 of 3)]
[im 1/8]
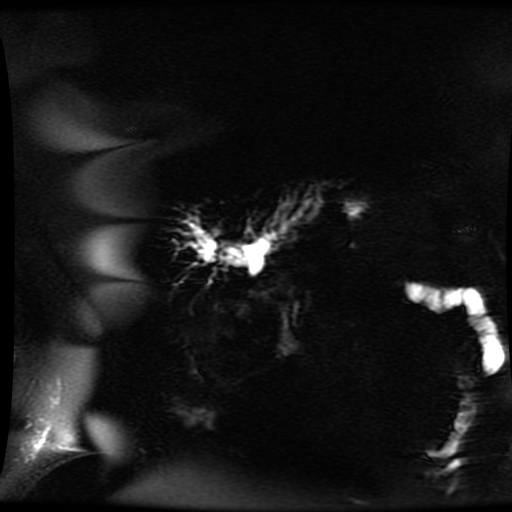

[Series 11: T2 · axial · 5.0mm · 0.78mm/px · 1 of 44 slices shown]
[im 1/44]
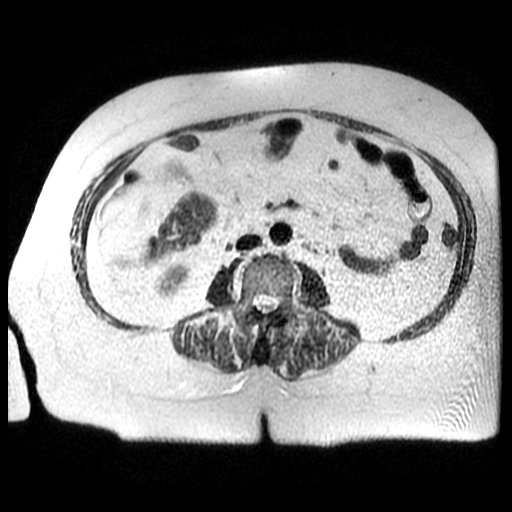

[Series 12: T1 dynamic · axial · 5.0mm · 0.78mm/px · z∈[-97,+120]mm · 2 of 88 slices shown (1 of 2)]
[im 1/88]
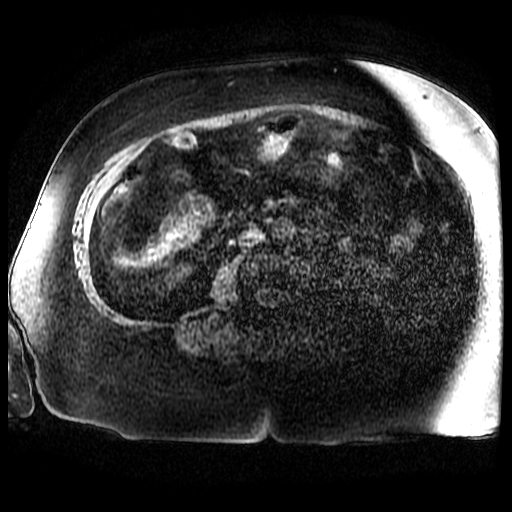
[im 88/88]
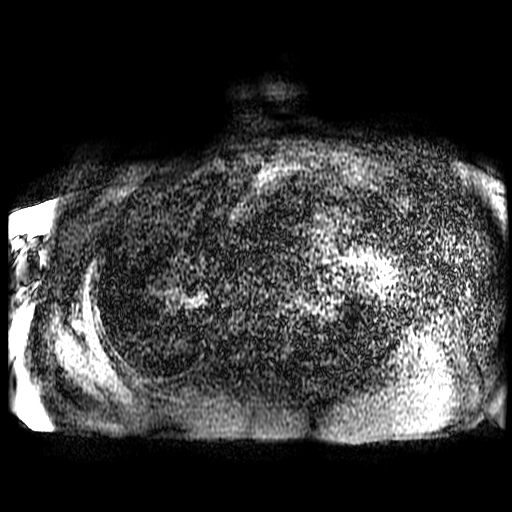

[Series 13: T1 dynamic · coronal · delayed · 4.0mm · 0.78mm/px · 2 of 120 slices shown (2 of 2)]
[im 1/120]
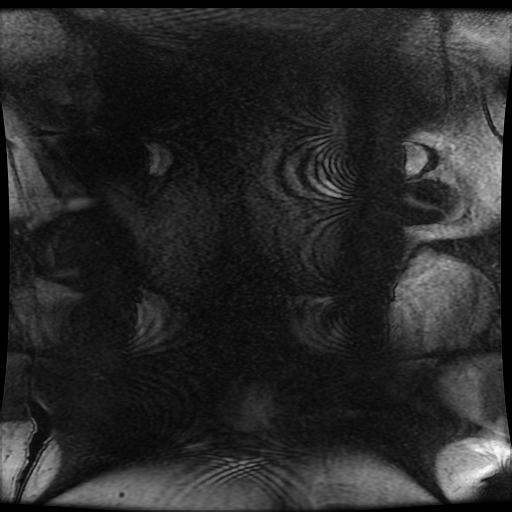
[im 120/120]
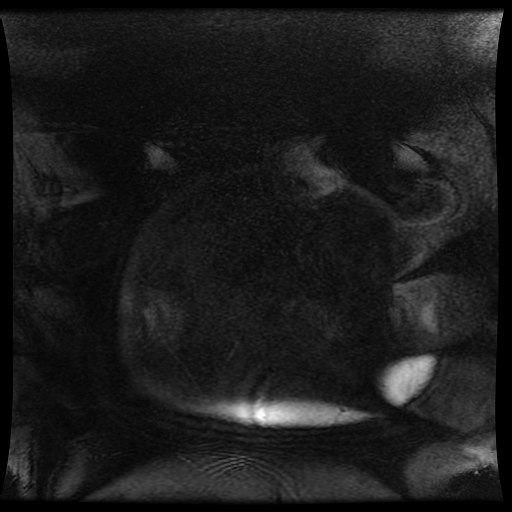

[Series 400: DWI · axial · 6.0mm · 1.56mm/px · 1 of 30 slices shown]
[im 1/30]
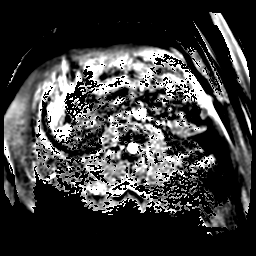

[Series 500: reformatted · axial · 1.8mm · 0.70mm/px · z∈[+30,+158]mm · 2 of 91 slices shown]
[im 1/91]
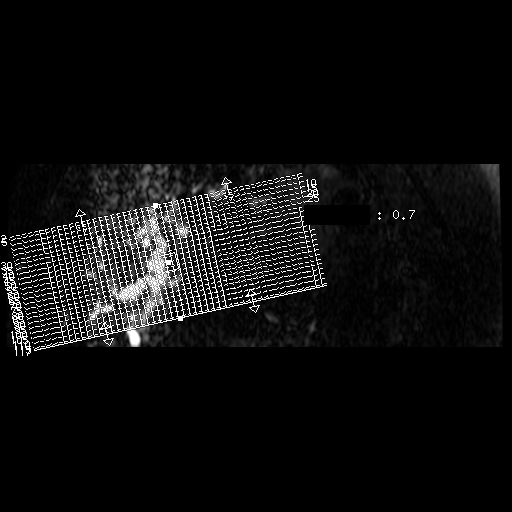
[im 91/91]
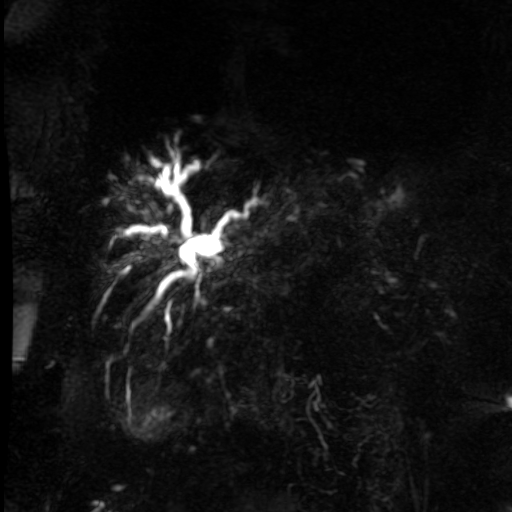

[16 of 16 positions shown; findings below may reference images not displayed]

FINDINGS: Lower chest: Heart is enlarged.

Hepatobiliary: There is marked intra hepatic biliary duct dilatation
involving both the right and left hepatic lobes level of obstruction
is near the biliary confluence/common duct. Common bile duct in the
head of the pancreas is nondilated. Obstructing etiology is a 6.8 x
8.2 x 8.5 cm irregular heterogeneous mass involving the inferior
right liver and extending anteriorly and cranially up into the
medial segment of the left liver. Mass lesion incorporates the body
of the gallbladder.

Pancreas: No focal mass lesion. No dilatation of the main duct. No
intraparenchymal cyst. No peripancreatic edema.

Spleen:  No splenomegaly. No focal mass lesion.

Adrenals/Urinary Tract: No adrenal nodule or mass. 11 mm cyst
identified upper pole left kidney. Right kidney unremarkable.

Stomach/Bowel: Stomach is nondistended. No gastric wall thickening.
No evidence of outlet obstruction. Duodenum is normally positioned
as is the ligament of Treitz. Duodenum crosses in close proximity to
is likely contiguous with the inferior and medial margin of the
liver mass. No small bowel or colonic dilatation within the
visualized abdomen.

Vascular/Lymphatic: No abdominal aortic aneurysm. 2.2 cm short axis
hepatoduodenal ligament lymph node versus direct tumor extension
into the porta hepatis.

Other:  No intraperitoneal free fluid

Musculoskeletal: No abnormal marrow signal within the visualized
bony anatomy.
IMPRESSION: 1. Study limited by motion artifact and inability to administer
intravenous contrast material. Within this limitation, the patient
has a large 7 x 8 x 9 cm mass centered in the inferior right liver,
incorporating the mid gallbladder and extending anteriorly and
cranially up into the medial segment of the left liver. Mass
obstructs the right and left biliary ducts at the level of the
confluence and the gallbladder fundus is distended and stone filled.
Imaging features highly concerning for primary gallbladder neoplasm
with intrahepatic extension although cholangiocarcinoma or
hepatocellular carcinoma with biliary and gallbladder involvement
also possible.
2. Relationship between the mass and retroperitoneal anatomy is
limited by motion and lack of intravenous contrast. The mass is
contiguous with the descending duodenum and duodenal involvement is
a concern. No evidence for obstruction at the level of the duodenum.
3. Hepatoduodenal ligament lymphadenopathy versus direct tumor
extension into the hepatoduodenal ligament.

## 2019-11-19 IMAGING — US US ABDOMEN LIMITED
1 series · 14 of 25 positions shown · non-contrast
Comparison: Ultrasound kidneys 10/15/2016

CLINICAL DATA: Increased bilirubin.  History of gallstones.

EXAM:
ULTRASOUND ABDOMEN LIMITED RIGHT UPPER QUADRANT

[Series 1: us abdomen limited · 0.22mm/px · 14 of 49 slices shown]
[im 1/49]
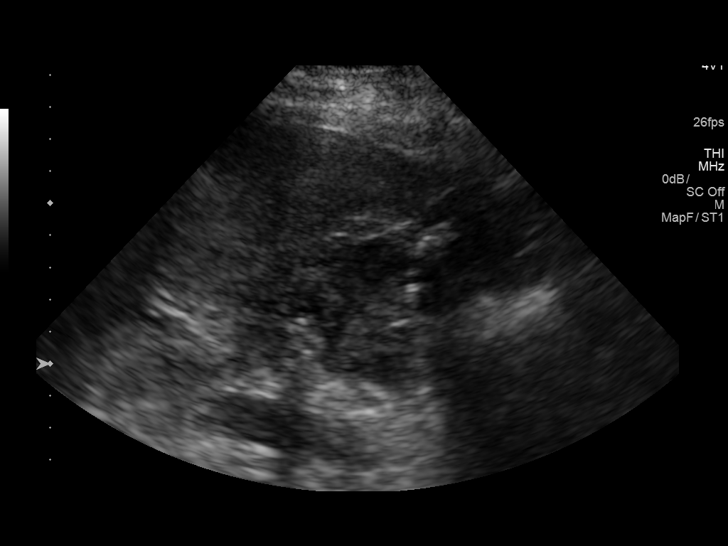
[im 5/49]
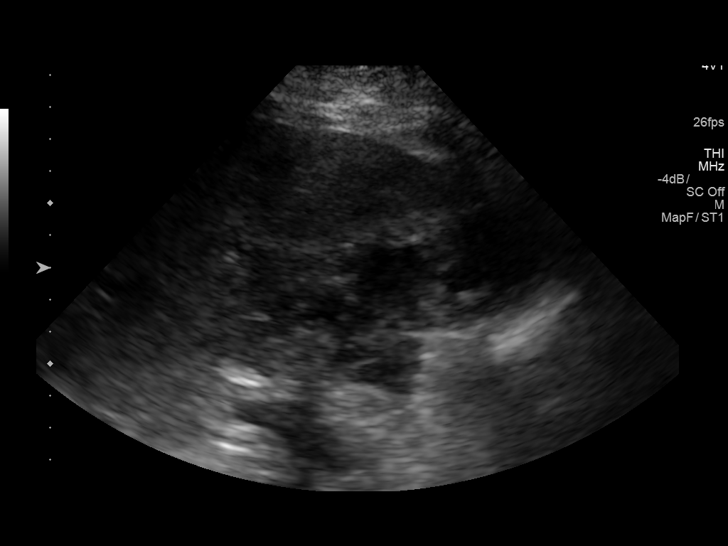
[im 9/49]
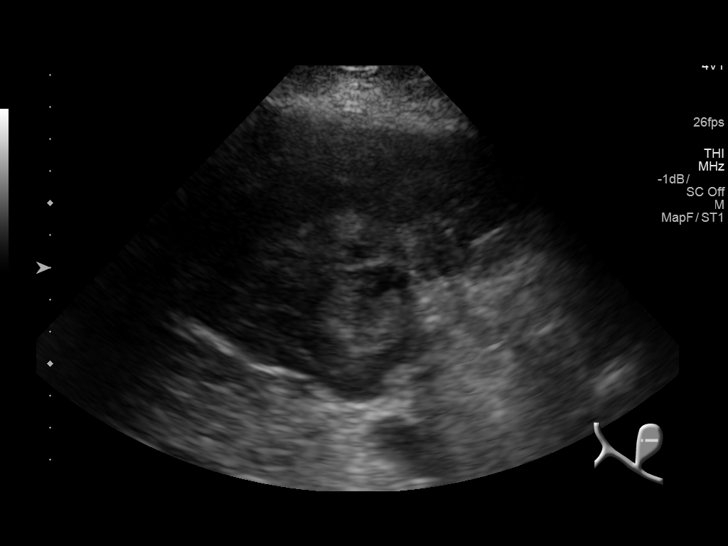
[im 13/49]
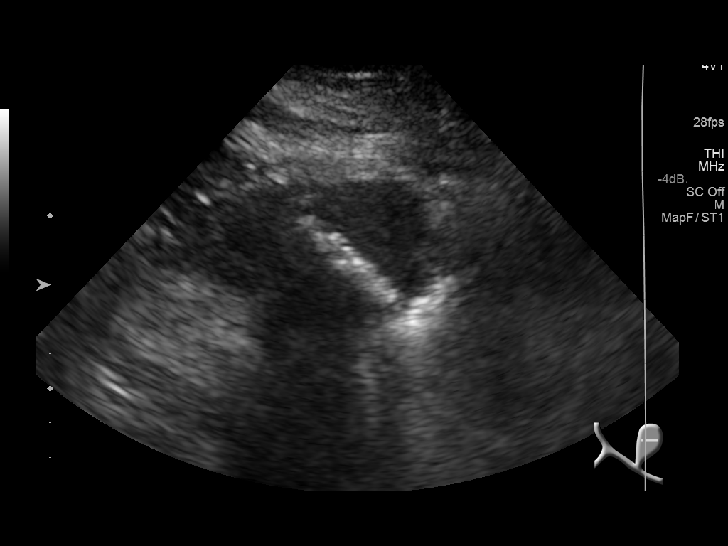
[im 17/49]
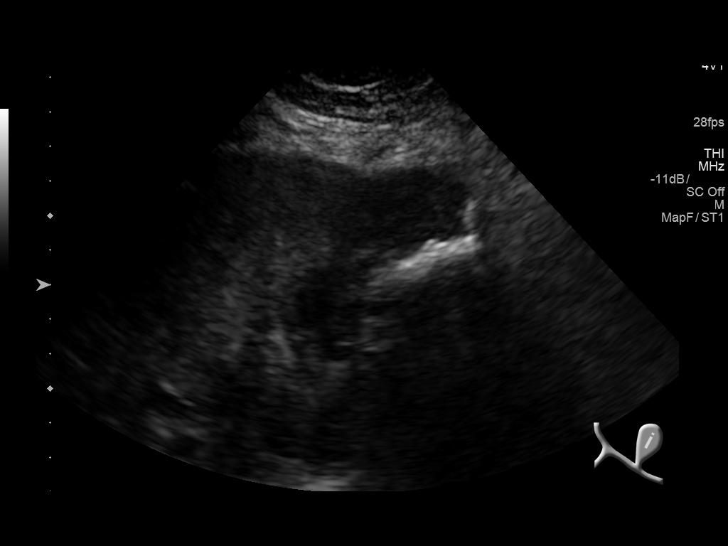
[im 19/49]
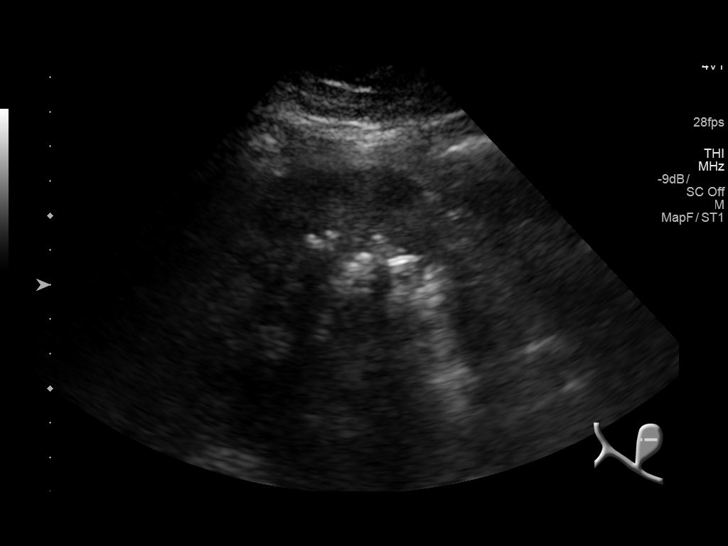
[im 23/49]
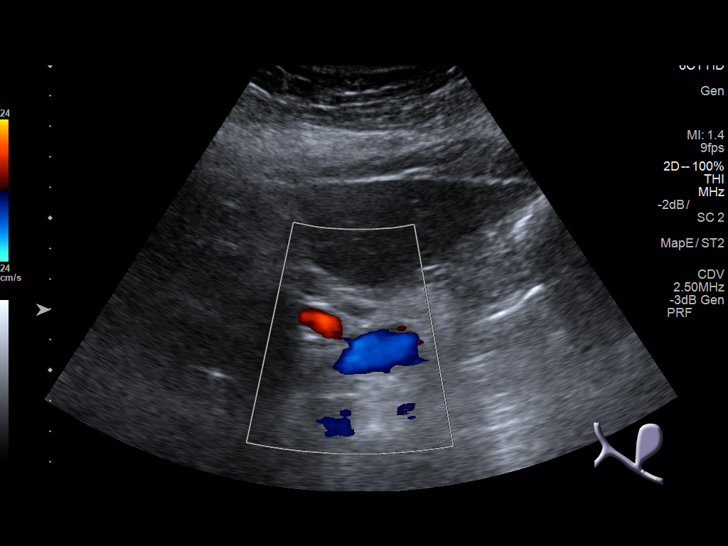
[im 27/49]
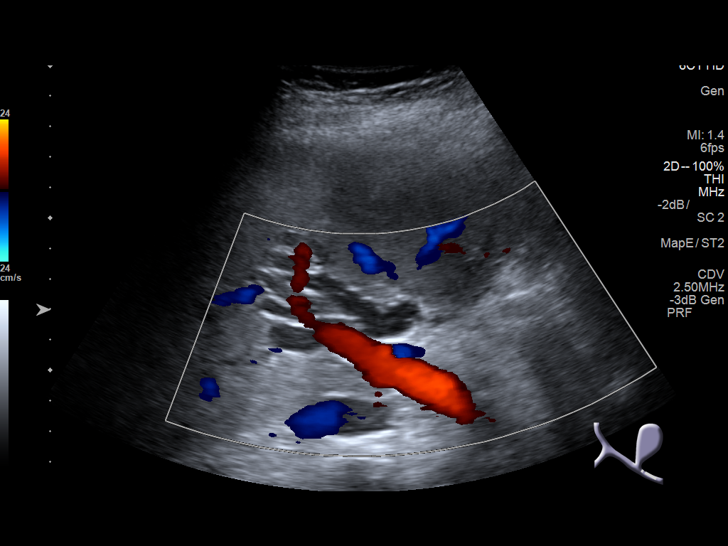
[im 31/49]
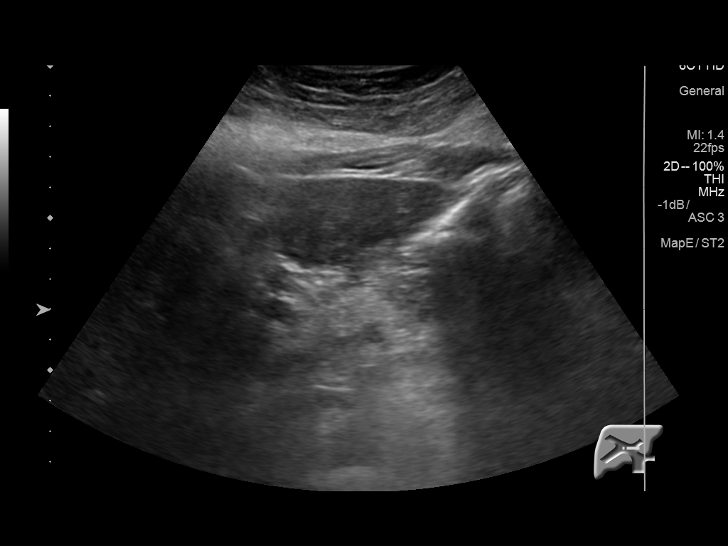
[im 33/49]
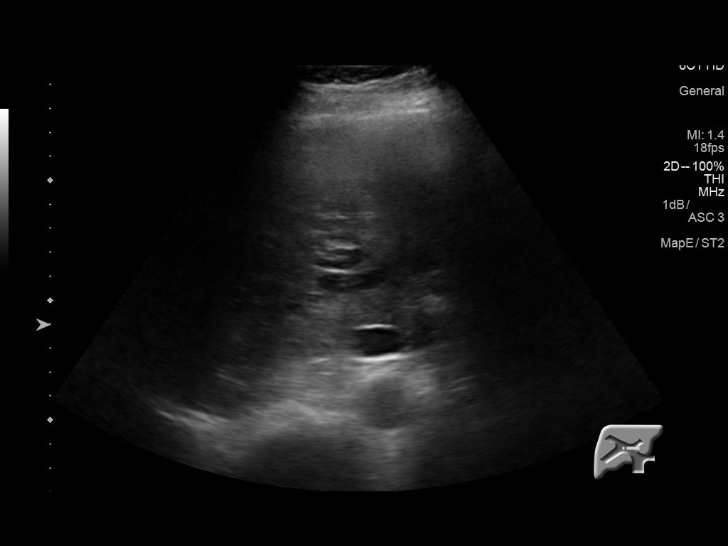
[im 37/49]
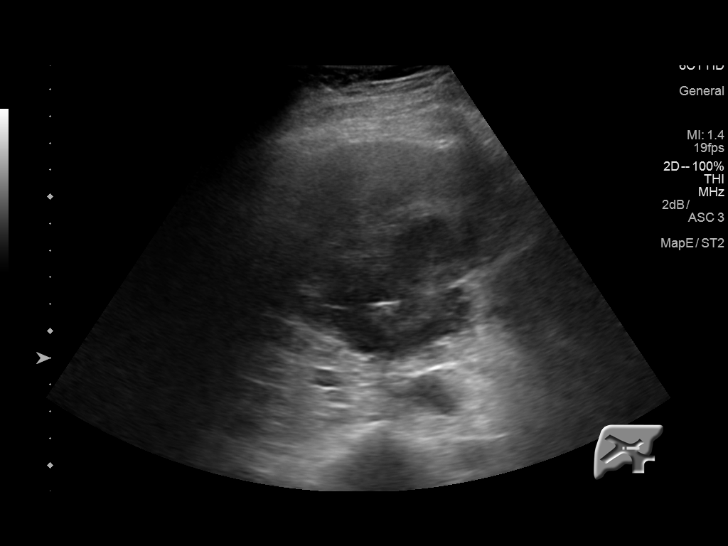
[im 41/49]
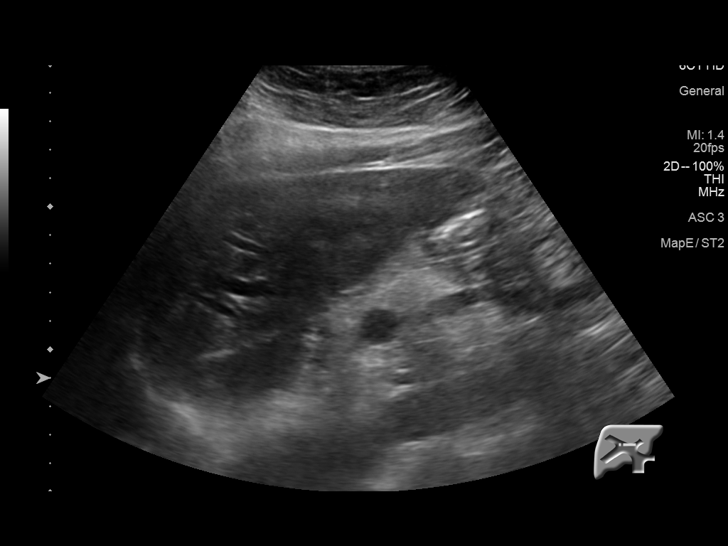
[im 45/49]
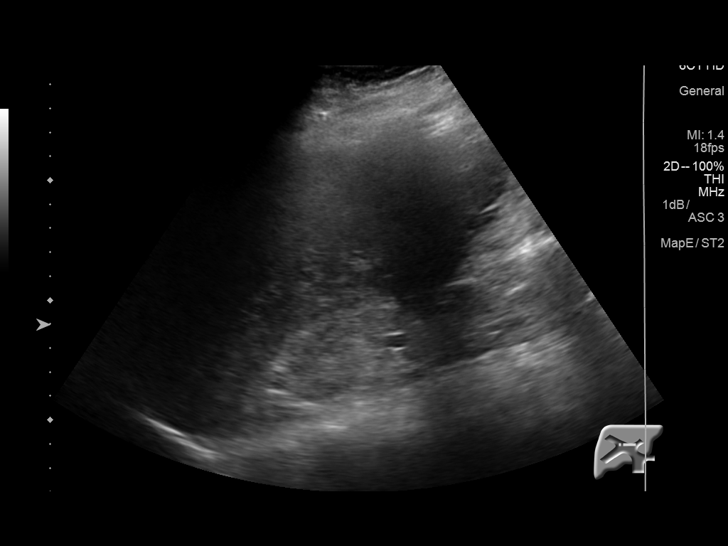
[im 49/49]
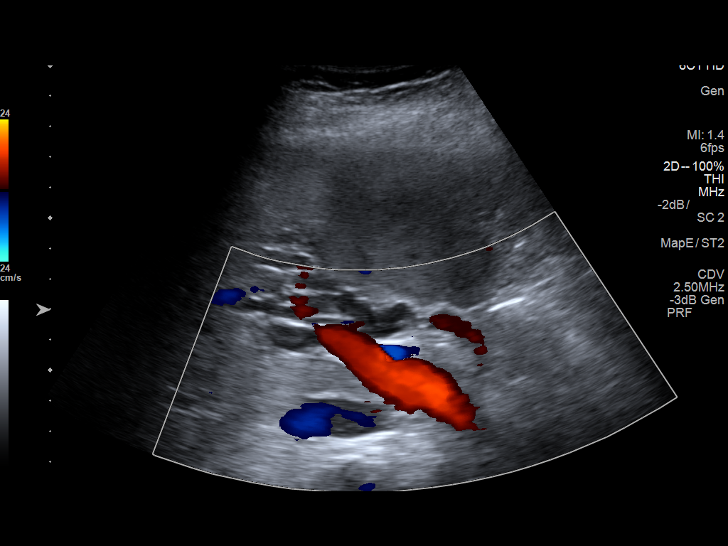

[14 of 25 positions shown; findings below may reference images not displayed]

FINDINGS: Gallbladder:

Cholelithiasis with multiple stones and sludge filling the
gallbladder. Largest stone is measured at 1.6 cm. Gallbladder wall
thickness is mildly increased at 3.4 mm. Murphy's sign is negative.

Common bile duct:

Diameter: Dilated at 8.6 mm. No cause of obstruction identified but
the entire duct is not visualized.

Liver:

No focal lesion identified. Within normal limits in parenchymal
echogenicity. Intrahepatic bile duct dilatation is present. Portal
vein is patent on color Doppler imaging with normal direction of
blood flow towards the liver.
IMPRESSION: 1. Cholelithiasis with sludge and gallbladder wall thickening.
Changes may represent cholecystitis although Murphy's sign is
negative.
2. Dilated intra and extrahepatic bile ducts suspicious for
obstruction. No stone is identified but the entire common bile duct
is not visualized.
